# Patient Record
Sex: Female | Born: 1945 | Race: White | Hispanic: No | Marital: Married | Smoking: Never smoker
Health system: Southern US, Community
[De-identification: ages and names within clinical notes are randomized; demographics above are authoritative.]

## PROBLEM LIST (undated history)

## (undated) DIAGNOSIS — Z9889 Other specified postprocedural states: Secondary | ICD-10-CM

## (undated) DIAGNOSIS — G8929 Other chronic pain: Secondary | ICD-10-CM

## (undated) DIAGNOSIS — F32A Depression, unspecified: Secondary | ICD-10-CM

## (undated) DIAGNOSIS — K449 Diaphragmatic hernia without obstruction or gangrene: Secondary | ICD-10-CM

## (undated) DIAGNOSIS — Z8701 Personal history of pneumonia (recurrent): Secondary | ICD-10-CM

## (undated) DIAGNOSIS — G629 Polyneuropathy, unspecified: Secondary | ICD-10-CM

## (undated) DIAGNOSIS — E7800 Pure hypercholesterolemia, unspecified: Secondary | ICD-10-CM

## (undated) DIAGNOSIS — G4730 Sleep apnea, unspecified: Secondary | ICD-10-CM

## (undated) DIAGNOSIS — K589 Irritable bowel syndrome without diarrhea: Secondary | ICD-10-CM

## (undated) DIAGNOSIS — I10 Essential (primary) hypertension: Secondary | ICD-10-CM

## (undated) DIAGNOSIS — T4145XA Adverse effect of unspecified anesthetic, initial encounter: Secondary | ICD-10-CM

## (undated) DIAGNOSIS — M1990 Unspecified osteoarthritis, unspecified site: Secondary | ICD-10-CM

## (undated) DIAGNOSIS — N390 Urinary tract infection, site not specified: Secondary | ICD-10-CM

## (undated) DIAGNOSIS — E039 Hypothyroidism, unspecified: Secondary | ICD-10-CM

## (undated) DIAGNOSIS — Z8719 Personal history of other diseases of the digestive system: Secondary | ICD-10-CM

## (undated) DIAGNOSIS — Z9289 Personal history of other medical treatment: Secondary | ICD-10-CM

## (undated) DIAGNOSIS — G5600 Carpal tunnel syndrome, unspecified upper limb: Secondary | ICD-10-CM

## (undated) DIAGNOSIS — E038 Other specified hypothyroidism: Secondary | ICD-10-CM

## (undated) DIAGNOSIS — J449 Chronic obstructive pulmonary disease, unspecified: Secondary | ICD-10-CM

## (undated) DIAGNOSIS — D649 Anemia, unspecified: Secondary | ICD-10-CM

## (undated) DIAGNOSIS — N209 Urinary calculus, unspecified: Secondary | ICD-10-CM

## (undated) DIAGNOSIS — B372 Candidiasis of skin and nail: Secondary | ICD-10-CM

## (undated) DIAGNOSIS — K3184 Gastroparesis: Secondary | ICD-10-CM

## (undated) DIAGNOSIS — R51 Headache: Secondary | ICD-10-CM

## (undated) DIAGNOSIS — C4490 Unspecified malignant neoplasm of skin, unspecified: Secondary | ICD-10-CM

## (undated) DIAGNOSIS — I2510 Atherosclerotic heart disease of native coronary artery without angina pectoris: Secondary | ICD-10-CM

## (undated) DIAGNOSIS — R519 Headache, unspecified: Secondary | ICD-10-CM

## (undated) DIAGNOSIS — M549 Dorsalgia, unspecified: Secondary | ICD-10-CM

## (undated) DIAGNOSIS — K5790 Diverticulosis of intestine, part unspecified, without perforation or abscess without bleeding: Secondary | ICD-10-CM

## (undated) DIAGNOSIS — J189 Pneumonia, unspecified organism: Secondary | ICD-10-CM

## (undated) DIAGNOSIS — F419 Anxiety disorder, unspecified: Secondary | ICD-10-CM

## (undated) DIAGNOSIS — M810 Age-related osteoporosis without current pathological fracture: Secondary | ICD-10-CM

## (undated) DIAGNOSIS — T8859XA Other complications of anesthesia, initial encounter: Secondary | ICD-10-CM

## (undated) DIAGNOSIS — J302 Other seasonal allergic rhinitis: Secondary | ICD-10-CM

## (undated) DIAGNOSIS — F329 Major depressive disorder, single episode, unspecified: Secondary | ICD-10-CM

## (undated) DIAGNOSIS — K8020 Calculus of gallbladder without cholecystitis without obstruction: Secondary | ICD-10-CM

## (undated) DIAGNOSIS — K219 Gastro-esophageal reflux disease without esophagitis: Secondary | ICD-10-CM

## (undated) HISTORY — PX: FOOT SURGERY: SHX648

## (undated) HISTORY — DX: Hypothyroidism, unspecified: E03.9

## (undated) HISTORY — PX: APPENDECTOMY: SHX54

## (undated) HISTORY — DX: Candidiasis of skin and nail: B37.2

## (undated) HISTORY — DX: Gastro-esophageal reflux disease without esophagitis: K21.9

## (undated) HISTORY — DX: Personal history of pneumonia (recurrent): Z87.01

## (undated) HISTORY — DX: Gastroparesis: K31.84

## (undated) HISTORY — DX: Unspecified malignant neoplasm of skin, unspecified: C44.90

## (undated) HISTORY — DX: Other specified hypothyroidism: E03.8

## (undated) HISTORY — DX: Anemia, unspecified: D64.9

## (undated) HISTORY — DX: Personal history of other medical treatment: Z92.89

## (undated) HISTORY — PX: NOSE SURGERY: SHX723

## (undated) HISTORY — PX: ABDOMINAL HYSTERECTOMY: SHX81

## (undated) HISTORY — DX: Unspecified osteoarthritis, unspecified site: M19.90

## (undated) HISTORY — PX: UMBILICAL HERNIA REPAIR: SHX196

## (undated) HISTORY — DX: Polyneuropathy, unspecified: G62.9

## (undated) HISTORY — DX: Chronic obstructive pulmonary disease, unspecified: J44.9

## (undated) HISTORY — PX: TOE AMPUTATION: SHX809

## (undated) HISTORY — PX: ARTHOSCOPIC ROTAOR CUFF REPAIR: SHX5002

## (undated) HISTORY — DX: Essential (primary) hypertension: I10

## (undated) HISTORY — DX: Other specified postprocedural states: Z98.890

## (undated) HISTORY — PX: KNEE SURGERY: SHX244

## (undated) HISTORY — PX: WRIST SURGERY: SHX841

## (undated) HISTORY — DX: Diverticulosis of intestine, part unspecified, without perforation or abscess without bleeding: K57.90

## (undated) HISTORY — PX: BACK SURGERY: SHX140

## (undated) HISTORY — DX: Headache: R51

## (undated) HISTORY — DX: Other chronic pain: G89.29

## (undated) HISTORY — DX: Major depressive disorder, single episode, unspecified: F32.9

## (undated) HISTORY — DX: Other seasonal allergic rhinitis: J30.2

## (undated) HISTORY — DX: Sleep apnea, unspecified: G47.30

## (undated) HISTORY — PX: TONSILLECTOMY: SUR1361

## (undated) HISTORY — DX: Headache, unspecified: R51.9

## (undated) HISTORY — PX: BREAST SURGERY: SHX581

## (undated) HISTORY — DX: Diaphragmatic hernia without obstruction or gangrene: K44.9

## (undated) HISTORY — PX: CHOLECYSTECTOMY: SHX55

## (undated) HISTORY — DX: Personal history of other diseases of the digestive system: Z87.19

## (undated) HISTORY — DX: Carpal tunnel syndrome, unspecified upper limb: G56.00

## (undated) HISTORY — DX: Irritable bowel syndrome, unspecified: K58.9

## (undated) HISTORY — PX: OTHER SURGICAL HISTORY: SHX169

## (undated) HISTORY — DX: Age-related osteoporosis without current pathological fracture: M81.0

## (undated) HISTORY — DX: Calculus of gallbladder without cholecystitis without obstruction: K80.20

## (undated) HISTORY — DX: Depression, unspecified: F32.A

## (undated) HISTORY — DX: Anxiety disorder, unspecified: F41.9

---

## 2000-09-11 ENCOUNTER — Inpatient Hospital Stay (HOSPITAL_COMMUNITY): Admission: EM | Admit: 2000-09-11 | Discharge: 2000-09-12 | Payer: Self-pay | Admitting: Cardiology

## 2001-06-27 ENCOUNTER — Other Ambulatory Visit: Admission: RE | Admit: 2001-06-27 | Discharge: 2001-06-27 | Payer: Self-pay | Admitting: Dermatology

## 2002-02-04 ENCOUNTER — Encounter: Payer: Self-pay | Admitting: Family Medicine

## 2002-02-04 ENCOUNTER — Ambulatory Visit (HOSPITAL_COMMUNITY): Admission: RE | Admit: 2002-02-04 | Discharge: 2002-02-04 | Payer: Self-pay | Admitting: Family Medicine

## 2002-03-31 ENCOUNTER — Encounter (HOSPITAL_COMMUNITY): Admission: RE | Admit: 2002-03-31 | Discharge: 2002-04-30 | Payer: Self-pay | Admitting: Orthopedic Surgery

## 2002-05-08 ENCOUNTER — Encounter (HOSPITAL_COMMUNITY): Admission: RE | Admit: 2002-05-08 | Discharge: 2002-06-07 | Payer: Self-pay | Admitting: Orthopedic Surgery

## 2002-06-09 ENCOUNTER — Encounter (HOSPITAL_COMMUNITY): Admission: RE | Admit: 2002-06-09 | Discharge: 2002-07-09 | Payer: Self-pay | Admitting: Orthopedic Surgery

## 2002-07-22 ENCOUNTER — Encounter (HOSPITAL_COMMUNITY): Admission: RE | Admit: 2002-07-22 | Discharge: 2002-08-21 | Payer: Self-pay | Admitting: Orthopedic Surgery

## 2002-09-11 ENCOUNTER — Encounter: Payer: Self-pay | Admitting: Emergency Medicine

## 2002-09-11 ENCOUNTER — Inpatient Hospital Stay (HOSPITAL_COMMUNITY): Admission: EM | Admit: 2002-09-11 | Discharge: 2002-09-12 | Payer: Self-pay | Admitting: Emergency Medicine

## 2002-09-12 ENCOUNTER — Encounter: Payer: Self-pay | Admitting: Cardiology

## 2002-10-22 ENCOUNTER — Encounter: Payer: Self-pay | Admitting: Family Medicine

## 2002-10-22 ENCOUNTER — Ambulatory Visit (HOSPITAL_COMMUNITY): Admission: RE | Admit: 2002-10-22 | Discharge: 2002-10-22 | Payer: Self-pay | Admitting: Family Medicine

## 2002-12-16 ENCOUNTER — Ambulatory Visit (HOSPITAL_COMMUNITY): Admission: RE | Admit: 2002-12-16 | Discharge: 2002-12-16 | Payer: Self-pay | Admitting: Internal Medicine

## 2003-06-23 ENCOUNTER — Encounter: Payer: Self-pay | Admitting: Emergency Medicine

## 2003-06-23 ENCOUNTER — Emergency Department (HOSPITAL_COMMUNITY): Admission: EM | Admit: 2003-06-23 | Discharge: 2003-06-23 | Payer: Self-pay | Admitting: Emergency Medicine

## 2003-06-29 ENCOUNTER — Encounter (HOSPITAL_COMMUNITY): Admission: RE | Admit: 2003-06-29 | Discharge: 2003-07-29 | Payer: Self-pay | Admitting: *Deleted

## 2003-09-14 ENCOUNTER — Encounter: Payer: Self-pay | Admitting: Family Medicine

## 2003-09-14 ENCOUNTER — Ambulatory Visit (HOSPITAL_COMMUNITY): Admission: RE | Admit: 2003-09-14 | Discharge: 2003-09-14 | Payer: Self-pay | Admitting: Family Medicine

## 2004-02-14 ENCOUNTER — Emergency Department (HOSPITAL_COMMUNITY): Admission: EM | Admit: 2004-02-14 | Discharge: 2004-02-14 | Payer: Self-pay | Admitting: Emergency Medicine

## 2004-03-16 ENCOUNTER — Ambulatory Visit (HOSPITAL_COMMUNITY): Admission: RE | Admit: 2004-03-16 | Discharge: 2004-03-16 | Payer: Self-pay | Admitting: Family Medicine

## 2004-04-01 ENCOUNTER — Ambulatory Visit (HOSPITAL_COMMUNITY): Admission: RE | Admit: 2004-04-01 | Discharge: 2004-04-01 | Payer: Self-pay | Admitting: Family Medicine

## 2004-06-01 ENCOUNTER — Other Ambulatory Visit: Payer: Self-pay

## 2004-08-04 ENCOUNTER — Ambulatory Visit (HOSPITAL_COMMUNITY): Admission: RE | Admit: 2004-08-04 | Discharge: 2004-08-04 | Payer: Self-pay | Admitting: Family Medicine

## 2006-10-07 ENCOUNTER — Observation Stay (HOSPITAL_COMMUNITY): Admission: EM | Admit: 2006-10-07 | Discharge: 2006-10-08 | Payer: Self-pay | Admitting: Emergency Medicine

## 2006-10-07 ENCOUNTER — Ambulatory Visit: Payer: Self-pay | Admitting: Cardiology

## 2007-01-30 ENCOUNTER — Ambulatory Visit: Payer: Self-pay | Admitting: Physician Assistant

## 2007-08-24 ENCOUNTER — Emergency Department (HOSPITAL_COMMUNITY): Admission: EM | Admit: 2007-08-24 | Discharge: 2007-08-24 | Payer: Self-pay | Admitting: Emergency Medicine

## 2008-02-24 ENCOUNTER — Ambulatory Visit (HOSPITAL_COMMUNITY): Admission: RE | Admit: 2008-02-24 | Discharge: 2008-02-24 | Payer: Self-pay | Admitting: Endocrinology

## 2008-04-05 ENCOUNTER — Ambulatory Visit: Payer: Self-pay | Admitting: Cardiology

## 2008-04-05 ENCOUNTER — Encounter: Payer: Self-pay | Admitting: Emergency Medicine

## 2008-04-05 ENCOUNTER — Inpatient Hospital Stay (HOSPITAL_COMMUNITY): Admission: EM | Admit: 2008-04-05 | Discharge: 2008-04-06 | Payer: Self-pay | Admitting: Cardiology

## 2008-04-20 ENCOUNTER — Ambulatory Visit: Payer: Self-pay | Admitting: Cardiology

## 2008-06-15 ENCOUNTER — Ambulatory Visit: Payer: Self-pay | Admitting: Cardiology

## 2010-02-08 ENCOUNTER — Ambulatory Visit: Payer: Self-pay | Admitting: Family Medicine

## 2010-06-28 ENCOUNTER — Encounter (HOSPITAL_COMMUNITY): Admission: RE | Admit: 2010-06-28 | Discharge: 2010-07-28 | Payer: Self-pay | Admitting: Endocrinology

## 2010-06-28 ENCOUNTER — Ambulatory Visit (HOSPITAL_COMMUNITY): Payer: Self-pay | Admitting: Endocrinology

## 2010-07-26 ENCOUNTER — Ambulatory Visit (HOSPITAL_COMMUNITY): Payer: Self-pay | Admitting: Oncology

## 2010-07-27 ENCOUNTER — Encounter (HOSPITAL_COMMUNITY): Admission: RE | Admit: 2010-07-27 | Discharge: 2010-08-26 | Payer: Self-pay | Admitting: Oncology

## 2011-01-04 ENCOUNTER — Ambulatory Visit: Payer: Self-pay | Admitting: Unknown Physician Specialty

## 2011-01-26 ENCOUNTER — Ambulatory Visit: Payer: Self-pay | Admitting: Pain Medicine

## 2011-02-23 ENCOUNTER — Ambulatory Visit: Payer: Self-pay | Admitting: Pain Medicine

## 2011-02-24 ENCOUNTER — Ambulatory Visit (HOSPITAL_COMMUNITY): Payer: Medicare Other

## 2011-02-24 ENCOUNTER — Encounter (HOSPITAL_COMMUNITY): Payer: Medicare Other | Attending: Endocrinology

## 2011-02-24 DIAGNOSIS — Z23 Encounter for immunization: Secondary | ICD-10-CM | POA: Insufficient documentation

## 2011-03-09 ENCOUNTER — Ambulatory Visit: Payer: Self-pay | Admitting: Pain Medicine

## 2011-03-20 ENCOUNTER — Ambulatory Visit: Payer: Self-pay | Admitting: Pain Medicine

## 2011-04-25 NOTE — H&P (Signed)
NAMECHANIYAH, JAHR               ACCOUNT NO.:  [id number]   MEDICAL RECORD NO.:  [id number]          PATIENT TYPE:  INP   LOCATION:  3712                         FACILITY:  [facility]   PHYSICIAN:  Nicolasa Ducking, ANP DATE OF BIRTH:  May 15, 1946   DATE OF ADMISSION:  04/05/2008  DATE OF DISCHARGE:                              HISTORY & PHYSICAL   PRIMARY CARDIOLOGIST:  Gerrit Friends. Dietrich Pates, MD, [facility]   PRIMARY CARE Malley Hauter:  Alan Mulder, MD, in [place].   PATIENT PROFILE:  A 65 year old Caucasian female with prior history of  hypertension, hyperlipidemia, diabetes, obesity, and family history of  coronary artery disease who presents with unstable angina.   PROBLEM:  1. Unstable angina.  2. History of nonobstructive coronary artery disease.      a.     Cardiac catheterization, October 2001.  Nonobstructive       coronary artery disease with minor irregularities.  Normal left       ventricular function.      b.     Exercise Myoview, October 2007, achieved six mets with a       heart rate 172.  Ejection fraction 65%.  Imaging revealed breast       attenuation with no evidence of ischemia or infarct.  3. Type 2 diabetes mellitus.  4. Morbid obesity.  5. Family history of coronary artery disease.  6. Chronic back pain.  7. 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  8. Depression.  9. Status post hysterectomy in 1974.  10.Skin grafting to nose secondary to basal and squamous cell      carcinoma.  11.Hypertension.  12.Gastroesophageal reflux disease.  13.Status post cholecystectomy.  14.Status post lumbar laminectomy.  15.Status post right rotator cuff repair.  16.Asthma.   HISTORY OF PRESENT ILLNESS:  A 65 year old married married, Caucasian female  with history of hypertension, hyperlipidemia, diabetes, obesity,  nonobstructive CAD, and family history of CAD.  She had trivial CAD and  cath in October 2001, and had a negative Myoview with poor exercise  tolerance in  October 2007.  She has chronic dyspnea related to asthma,  but otherwise denies chest discomfort or change in degree of dyspnea  over the years.  Today at church at approximately 11:30 a.m., she was  sitting in the pew when she had sudden onset of 10/10 substernal chest  squeezing and tightness with mild associated shortness of breath and  radiation and numbness of the right neck.  Her and her husband left the  church and drove to [facility] within about 15 minutes of the  onset of symptoms and there she was treated with IV nitroglycerin and  aspirin with complete resolution of symptoms approximately 30 minutes  after onset.  ECG shows ST depression, T-wave inversion in lead V1 and  V2, and point-of-care markers were negative x1.  She was transferred to  [facility] for further evaluation and is currently pain free.   ALLERGIES:  DARVOCET, SULFA, MACRODANTIN, ADHESIVE TAPE, LASIX, AND  CYMBALTA.   HOME MEDICATIONS:  1. Flexeril 10 mg t.i.d.  2. Lasix 80 mg b.i.d.  3. Doxepin 50 mg nightly.  4. Metformin 5 mg b.i.d.  5. Topamax 25 mg nightly.  6. Tramadol acetaminophen p.r.n.  7. Gabapentin 1200 mg nightly.  8. Lovastatin 40 mg nightly.  9. Allopurinol 100 mg daily.   FAMILY HISTORY:  Mother died of an MI at age 20.  Father died of CVA at  age 77.  She has siblings with COPD and coronary disease.   SOCIAL HISTORY:  She lives in [place] with her husband.  She is  retired.  She has never smoked cigarettes.  She denies any alcohol or  drugs.  She is not routinely exercising.   REVIEW OF SYSTEMS:  Positive for dyspnea secondary to asthma.  Positive  chest pain and mild dyspnea today.  She has had hoarseness and  productive cough with brown sputum for the past week.  She denies any  fevers or chills.  She has chronic bilateral foot and ankle pain.  She  has a history of diabetes.  Otherwise, all systems reviewed and  negative.   PHYSICAL EXAMINATION:  VITAL SIGNS: Temperature  97.5, heart rate 74,  respirations 20, blood pressure 119/66, and pulse ox 99% on 2 L.  GENERAL: Pleasant obese Caucasian female in no acute distress.  NECK: Obese and difficult to assess JVP.  No bruits.  LUNGS: Respirations are regular and labored. Clear to auscultation.  CARDIAC: Regular S1and S2.  No S3 or S4 murmurs.  ABDOMEN: Obese, soft, nontender, and nondistended.  Bowel sounds  present.  EXTREMITIES: All 4 extremities warm, dry, and pink.  No clubbing,  cyanosis, or edema.  Dorsalis pedis and posterior tibial pulses are 2+  and equal bilaterally.  SKIN: Warm and dry without lesions or masses.  HEENT: Normal.  NEUROLOGIC:  Grossly intact.  Nonfocal.   Chest x-ray shows no acute findings.  EKG shows sinus rhythm at rate of  72 at left axis, ST depression, T-wave inversion in V1 and V2.   LABORATORY DATA:  Hemoglobin 12.6, hematocrit 37.2, WBC 5.3, and  platelets 262.  Sodium 139, potassium 4.1, chloride 104, CO2 32, BUN 10,  creatinine 0.83, glucose 101, CK-MB 3.1, troponin-I less than 0.5, and  calcium 8.9.   ASSESSMENT AND PLAN:  1. Unstable angina.  The patient was truly concerning for angina.      Symptoms improved with aspirin and nitroglycerin.  Plan to add      heparin, beta blocker, and cycle cardiac markers.  We will continue      her statin.  She has a history of minor irregularities on cath      approximately 7-1/2 years ago but with her multiple risk factors      including hypertension, hyperlipidemia, diabetes, obesity, and      family history, we will plan catheterization in the a.m.  2. Hypertension, stable.  Consider ACE inhibitor as an outpatient with      her history of diabetes.  3. Hyperlipidemia.  Check lipids and LFTs.  Continue statin.  4. Diabetes.  Hold metformin, add sliding-scale insulin.  5. Obesity, cardiac rehab, and nutrition consult would be beneficial.  6. Chronic pain/neuropathy.  Continue home meds.      Nicolasa Ducking,  ANP     CB/MEDQ  D:  04/05/2008  T:  04/06/2008  Job:  [id number]

## 2011-04-25 NOTE — Letter (Signed)
June 15, 2008    Alan Mulder, M.D.  [address]  [place], [place] [address]   RE:  Alexis Rios, Alexis Rios  MRN:  [id number]  /  DOB:  September 27, 1946   Dear Dr. Patrecia Pace:   Ms. Stuber returns to the office for continued assessment and treatment  of atypical chest discomfort with insignificant coronary artery disease.  Since her last visit, she has done generally well.  She has occasional  episodes of chest discomfort with onset at rest and relief with  sublingual nitroglycerin after approximately 20 minutes.  She took 1  nitroglycerin on one occasion and 2 nitroglycerin on a second occasion.  She believes that Prilosec is decreasing the frequency of episodes.   Otherwise, medications have changed only in the addition of voltaren 75  mg b.i.d..   PHYSICAL EXAMINATION:  GENERAL:  Pleasant overweight woman in no acute  distress.  VITALS:  The weight is 237, 4 pounds more than in May.  NECK:  No jugular venous distention; no carotid bruits.  HEENT:  Scarring over the nose from previous skin grafting.  Thorax:  Scarring over the right clavicle, which was a donor site for her skin  grafts.  LUNGS:  Clear.  CARDIAC:  Normal first heart sounds; modest systolic ejection murmur;  accentuated second heart sound.  ABDOMEN:  Soft and nontender; no organomegaly.  EXTREMITIES:  No edema.   IMPRESSION:  Ms. Hahne is doing well with current therapy.  I explained  to her that her symptoms were probably multifactorial and not of great  concern.  We will continue symptomatic treatment for her current level  of control, which is good, but not perfect.  I will plan to re-evaluate  this nice woman in 6 months and then on a p.r.n. basis thereafter.    Sincerely,       Gerrit Friends. Dietrich Pates, MD, [facility]  Electronically Signed     Gerrit Friends. Dietrich Pates, MD, [facility]  Electronically Signed   RMR/MedQ  DD: 06/15/2008  DT: 06/15/2008  Job #: [id number]

## 2011-04-25 NOTE — Letter (Signed)
Apr 20, 2008    Alan Mulder, M.D.  [address]  [place], [place] [address]   RE:  YENNY, KOSA  MRN:  [id number]  /  DOB:  12/14/1945   Dear Dr. Patrecia Pace:   Ms. Asmus returns to the office following a recent admission to [facility].  Cardiac catheterization revealed insignificant coronary  disease.  She has continued to have mid substernal chest tightness,  which is intermittent and of mild to moderate severity.  This frequently  is present in the morning when she awakens.  There is no relationship to  exertion.  She has used omeprazole in the past for presumed GERD.   PHYSICAL EXAMINATION:  GENERAL APPEARANCE:  A pleasant overweight woman  in no acute distress.  HEENT:  Nasal deformity.  NECK:  No jugular venous distention; no carotid bruits.  LUNGS:  Clear.  CARDIAC:  Fourth heart sound present.  EXTREMITIES:  Benign catheterization site without bruits.   EKG:  Normal sinus rhythm; left anterior fascicular block versus prior  inferior myocardial infarction; delayed R-wave progression; nonspecific  T-wave abnormality.   IMPRESSION:  Ms. Shipp continues to have atypical chest discomfort.  She has benefited from sublingual nitroglycerin in the past.  A  prescription will be given to her.  She is also advised to take  omeprazole over-the-counter 20 mg daily.  I will reassess this nice  woman in two months.    Sincerely,      Gerrit Friends. Dietrich Pates, MD, [facility]  Electronically Signed    RMR/MedQ  DD: 04/20/2008  DT: 04/20/2008  Job #: [id number]

## 2011-04-25 NOTE — Cardiovascular Report (Signed)
Alexis Rios, Alexis Rios               ACCOUNT NO.:  [id number]   MEDICAL RECORD NO.:  [id number]          PATIENT TYPE:  INP   LOCATION:  3712                         FACILITY:  [facility]   PHYSICIAN:  Bevelyn Buckles. Bensimhon, MDDATE OF BIRTH:  07-14-1946   DATE OF PROCEDURE:  04/06/2008  DATE OF DISCHARGE:  04/06/2008                            CARDIAC CATHETERIZATION   PRIMARY CARE PHYSICIAN:  Alan Mulder, M.D., in [place].   CARDIOLOGIST:  Pricilla Riffle, MD, [facility].   PATIENT IDENTIFICATION:  Alexis Rios is a 65 year old with multiple  cardiac risk factors including obesity, diabetes, hypertension, and  hyperlipidemia.  She had a cardiac catheterization in 2001 which showed  minimal nonobstructive coronary artery disease.  She was admitted with  increasing chest pain concerning for unstable angina.  Chest pain was  relieved with nitroglycerin.  She was thus referred for cardiac  catheterization.  Cardiac markers have been normal.   PROCEDURES PERFORMED:  1. Selective coronary angiography.  2. Left heart catheterization.  3. Left ventriculogram.   DESCRIPTION OF PROCEDURE:  The risks and indication of the procedure  were reviewed.  Consent was signed and placed on the chart.  A 5-French  arterial sheath was placed in right femoral artery using modified  Seldinger technique.  Standard catheters including JL-4, JR-4, and  angled pigtail used for procedure.  All catheter exchanges made of wire.  There was no apparent complications.   Central aortic pressure 131/72 with a mean of 96.  LV pressure was 108/0  with an EDP of 4.  There was no aortic stenosis.   Left main was long, angiographically normal.   LAD was a moderate-sized vessel which tapered distally gave off 2  diagonals, was angiographically normal.   Left circumflex gave off larger OM-1 and OM-2 and had 30% stenosis in  the mid AV groove circ, otherwise normal.   Right coronary artery was large dominant vessel gave  off an RV branch,  PDA, and posterolateral.  There was 20% plaque in the mid RCA.   Left ventriculogram showed an ejection fraction of 60%.  There was no  regional wall motion abnormalities.  No trace mitral regurgitation.   ASSESSMENT:  1. Minimal nonobstructive coronary artery disease.  2. Normal left ventricular function.   PLAN:  Given her angiogram, I doubt her chest pain is cardiac in nature.  We will need to continue risk factor management.  Probable discharge to  home later tonight if her groin remains stable.      Bevelyn Buckles. Bensimhon, MD  Electronically Signed     DRB/MEDQ  D:  04/06/2008  T:  04/07/2008  Job:  [id number]

## 2011-04-25 NOTE — Discharge Summary (Signed)
Alexis Rios, Alexis Rios               ACCOUNT NO.:  [id number]   MEDICAL RECORD NO.:  [id number]          PATIENT TYPE:  INP   LOCATION:  3712                         FACILITY:  [facility]   PHYSICIAN:  Bevelyn Buckles. Bensimhon, MDDATE OF BIRTH:  [birth year]   DATE OF ADMISSION:  04/05/2008  DATE OF DISCHARGE:  04/06/2008                               DISCHARGE SUMMARY   PRIMARY CARDIOLOGIST:  Dr. Gerrit Friends. Rothbart.   PRIMARY CARE [place] Santino:  Dr. Alan Mulder in [place].   PROCEDURE PERFORMED DURING HOSPITALIZATION:  Cardiac catheterization  completed by Dr. Bevelyn Buckles. Bensimhon on April 06, 2008, revealing  minimal nonobstructive coronary artery disease with normal left  ventricular function.   Plan:  Medical therapy.   FINAL DISCHARGE DIAGNOSES:  1. Unstable angina.  2. History of nonobstructive coronary artery disease.  A:  Status post cardiac catheterization on April 06, 2008, revealing  nonobstructive coronary artery disease and normal left ventricular  function.  B:  Exercise Myoview in October 2007, with an EF of 65% imaging revealed  breast attenuation with no evidence of ischemia or infarct.  1. Type 2 diabetes.  2. Morbid obesity.  3. Depression.  4. Hypertension.   HOSPITAL COURSE:  A 65 year old Caucasian female with history of  hypertension, hyperlipidemia, diabetes, obesity, and nonobstructive CAD,  who came to [facility] from [facility] secondary  to complaints of shortness of breath and chest discomfort radiating to  the right neck. The patient said it was a sudden onset while she was  sitting in church.  As a result of this, the patient was brought to  [facility] after being evaluated at [facility] and  transferred as a result.   The patient's cardiac enzymes were cycled and monitored over the  weekend, and the patient was scheduled for cardiac catheterization on  April 06, 2008.  The patient's cardiac enzymes were  found to be negative  x3.  She was placed on heparin until catheterization was planned.  She  was without any further complaints of chest discomfort.  The patient's  other labs remained stable.  Her Glucophage was held prior to  catheterization.   On day of catheterization, the patient did well without any evidence of  hematoma, bleeding, or signs of infection.  The patient was found to  have a minimal nonobstructive CAD and was advised to be transferred home  after recovery period, and continue current medication regimen.  The  patient was found to have elevated total cholesterol of 214 with  triglycerides of 117, HDL 45, and LDL of 146.  The patient is on Zocor  20 mg at bedtime, which should be increased to 40 mg at bedtime.  The  patient was given postcardiac catheterization instructions with followup  with Dr. Dietrich Pates stated as above.   DISCHARGE LABS:  Hemoglobin 12.8, hematocrit 38.9, white blood cell 6.6,  platelets 285.  PT 12.3 and PTT 54.  Sodium 138, potassium 3.7, chloride  101, CO2 of 30, BUN 8, creatinine 0.76, glucose of 122.  The patient's  troponins were found to  be negative x3.   DISCHARGE MEDICATIONS:  1. Flexeril 10 mg 3 times daily.  2. Lasix 80 mg twice a day.  3. Doxepin 50 mg at bedtime.  4. Metformin 500 mg twice a day to start in 48 hours.  5. Topamax 25 mg at bedtime.  6. Gabapentin 1200 mg at bedtime.  7. Lovastatin 20 mg at bedtime.  8. Allopurinol 100 mg daily.  9. Symbicort inhaler twice a day.  10.Albuterol inhaler 4 times a day.  11.Topamax at bedtime.   ALLERGIES:  LATEX, SULFA, NITROFURANTOIN, DARVOCET, and KEFLEX.   FOLLOWUP PLANS AND APPOINTMENTS:  1. The patient is to follow up with Dr. Dietrich Pates on Monday Apr 20, 2008, at 2:30 p.m. for continued cardiac care.  2. The patient is to follow up with primary care physician for      continued management of diabetes and other medical issues.  3. The patient has been given postcardiac  catheterization instructions      with particular emphasis on the right groin site for evidence of      bleeding, hematoma, or signs of infections.   TIME SPENT WITH THE PATIENT TO INCLUDE PHYSICIAN TIME:  30 minutes.      Bettey Mare. Lyman Bishop, NP      Bevelyn Buckles. Bensimhon, MD  Electronically Signed    KML/MEDQ  D:  04/06/2008  T:  04/07/2008  Job:  [id number]

## 2011-04-28 NOTE — Op Note (Signed)
NAME:  Alexis Rios, Alexis Rios                         ACCOUNT NO.:  [id number]   MEDICAL RECORD NO.:  [id number]                   PATIENT TYPE:  AMB   LOCATION:  DAY                                  FACILITY:  [facility]   PHYSICIAN:  Lionel December, M.D.                 DATE OF BIRTH:  05-12-1946   DATE OF PROCEDURE:  12/16/2002  DATE OF DISCHARGE:                                 OPERATIVE REPORT   PROCEDURE:  Esophagogastroduodenoscopy, followed by total colonoscopy.   INDICATIONS:  The patient is a 65 year old Caucasian female with recurrent  chest pain felt to be noncardiac.  She also has typical symptoms of  heartburn different than her chest pain, and she has not responded to PPIs.  She previously has been on Prevacid and now on Aciphex.  She also gives  history of intermittent hematochezia possibly due to hemorrhoids.  She is  undergoing colonoscopy to be sure that she does not have polyps or other  abnormalities.  Today she is also complaining of pain in her right upper  quadrant of her abdomen.  Exam is unremarkable other than mild tenderness.  Prior CT did not show any significant findings other than fatty liver.   Both the procedures were reviewed with the patient and informed consent was  obtained.   PREMEDICATION:  Cetacaine spray for pharyngeal topical anesthesia, Demerol  50 mg IV, Versed 8 mg IV in divided dose.   INSTRUMENT USED:  Olympus video system.   FINDINGS:  Procedure performed in endoscopy suite.  The patient's vital  signs and O2 saturation were monitored during the procedure and remained  stable.   PROCEDURE:  Esophagogastroduodenoscopy.   DESCRIPTION OF PROCEDURE:  The patient was placed in the left lateral  recumbent position and the endoscope was passed via the oropharynx without  any difficulty into esophagus.   Esophagus:  Mucosa of the esophagus normal throughout.  The squamocolumnar  junction was unremarkable, and there was small sliding hiatal hernia.   No  endoscopic changes of reflux esophagitis noted.   Stomach:  It was empty and distended very well with insufflation.  Folds of  proximal stomach were normal.  Examination of the mucosa revealed focal  prepyloric erythema, but there were no erosions or ulcers noted.  Please  note the patient has been treated for H. pylori infection last year.   Duodenum:  Duodenum exam showed the bulb and second part of duodenum were  normal.   The endoscope was withdrawn and the patient was prepared for procedure #2.   PROCEDURE:  Colonoscopy.   DESCRIPTION OF PROCEDURE:  The rectal examination performed.  She had small  sentinel skin tag.  Digital exam was normal.  The scope was placed in the  rectum and advanced under vision into sigmoid colon and beyond.  Preparation  was satisfactory.  The scope was passed to the cecum, which was identified  by  appendiceal stump and ileocecal valve.  Short segment of TI was also  examined and was normal.  There was a small, friable polyp at the cecum,  which was ablated by a cold biopsy.  As the scope was withdrawn, colonic  mucosa was carefully examined and there were no other abnormalities.  The  rectal mucosa was normal.  The scope was retroflexed to examine the  anorectal junction, and small hemorrhoids were noted below the dentate line.  The endoscope was straightened and withdrawn.  The patient tolerated the  procedure well.   FINAL DIAGNOSES:  1. Small sliding hiatal hernia.  No endoscopic evidence of reflux     esophagitis.  No evidence of peptic ulcer disease.  2. Normal terminal ileum.  3. Small polyp ablated by cold biopsy from the cecum.  4. Small external hemorrhoids.   RECOMMENDATIONS:  1. Antireflux measures reinforced.  Will increase her Aciphex to 20 mg p.o.     b.i.d.  2. Suspect her abdominal pain may be due to IBS, and will try her on Librax     at a low dose of one b.i.d.  I will be contacting the patient with biopsy     results  and plan to see her in the office in four to six weeks from now.                                               Lionel December, M.D.    NR/MEDQ  D:  12/16/2002  T:  12/16/2002  Job:  [id number]   cc:   Mila Homer. Sudie Bailey, M.D.  [address] [place], [place] [address]  Fax: [phone]

## 2011-04-28 NOTE — Group Therapy Note (Signed)
   [name], [name]                           ACCOUNT NO.:  [id number]   MEDICAL RECORD NO.:  [id number]                   PATIENT TYPE:  PREC   LOCATION:                                       FACILITY:   PHYSICIAN:  Vida Roller, M.D.                DATE OF BIRTH:   DATE OF PROCEDURE:  06/29/2003  DATE OF DISCHARGE:                                    STRESS TEST   EXERCISE CARDIOLITE   INDICATION:  Alexis Rios is a 65 year old female with nonobstructive coronary  artery disease and normal LV function by cardiac catheterization in October  2001.  Her cardiac risk factors include family history and hyperlipidemia.  She had an exercise Cardiolite in October 2003 which revealed no ischemia.  She now represents with atypical chest discomfort.   Baseline data:  EKG shows sinus rhythm at 75 beats per minute with  nonspecific ST abnormalities.  Blood pressure was 118/90.   The patient exercised for a total of four minutes 30 seconds to 6.1 METs.  Maximum heart rate achieved was 160 beats per minute which is 99% of  predicted maximum.  Maximum blood pressure is 142/84 which recovered down to  128/78 in recovery.  EKG shows no ischemic changes and few PVCs.   Final images and results are pending M.D. review.     Amy Mercy Riding, P.A. [facility]                     Vida Roller, M.D.    AB/MEDQ  D:  06/29/2003  T:  06/30/2003  Job:  [id number]

## 2011-04-28 NOTE — Consult Note (Signed)
NAMESOLACE, MANWARREN               ACCOUNT NO.:  [id number]   MEDICAL RECORD NO.:  [id number]          PATIENT TYPE:  INP   LOCATION:  A216                          FACILITY:  [facility]   PHYSICIAN:  Gerrit Friends. Dietrich Pates, MD, FACCDATE OF BIRTH:  [birth year]   DATE OF CONSULTATION:  10/08/2006  DATE OF DISCHARGE:                                   CONSULTATION   REFERRING PHYSICIAN:  Dr. Rito Ehrlich.   HISTORY OF PRESENT ILLNESS:  A 65 year old woman with multiple prior  admissions for acute chest discomfort returns with a recurrent, returns with  a recurrence of symptoms.  Ms. Hollingshead cardiac history dates to 2001 when  she was admitted to [facility] and transferred to [facility] with chest  discomfort.  Cardiac catheterization revealed normal coronary arteries and  normal left ventricular systolic function.  She subsequently returned in  2003 and underwent a stress nuclear study that was unremarkable except for  impaired exercise capacity.  She did well until the day of admission when  she developed chest tightness while traveling to church.  This was  moderately severe substernal tightness radiating to the neck and back.  There were no associated symptoms.  After an hour or so, the patient was  administered nitroglycerin by a friend, providing some relief.  She  subsequently requested additional assistance and was transported to the  emergency department by her husband where the pain gradually subsided.  Initial cardiac markers were negative.  EKG was nonspecifically abnormal as  has been the case in the past.   The patient has multiple cardiovascular risk factors including hypertension,  adult onset diabetes, obesity and a positive family history.  She also has a  history of GERD.   PAST MEDICAL HISTORY:  Is otherwise notable for:  1. Chronic back pain.  2. Depression.  3. 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  4. She underwent hysterectomy in 1974.  5. She has had a  number of skin graft procedures to her nose.   ALLERGIES:  The patient reports allergies to DARVOCET, SULFA, MACRODANTIN  and ADHESIVE TAPE as well as LATEX.   MEDICATIONS PRIOR TO ADMISSION:  1. Flexeril 10 mg 3 times a day.  2. Furosemide 80 mg 3 times a day.  3. Doxepin 50 mg daily.  4. Metformin 500 mg b.i.d.  5. Topamax 50 mg nightly.  6. Nexium 40 mg b.i.d.  7. Tramadol/acetaminophen p.r.n.  8. Gabapentin 1200 mg nightly.   SOCIAL HISTORY:  No use of tobacco products; no excessive use of alcohol.  Married and lives locally; two adult children.  Receiving social  security/disability.   FAMILY HISTORY:  Mother suffered a fatal myocardial infarction at age 59;  father had a fatal CVA at age 43.  He has siblings with COPD and cardiac  problems.   REVIEW OF SYSTEMS:  Is notable for intermittent bronchitis, fairly frequent  headaches, occasional dizziness, good diabetic control with CBGs between 110  and 150, hemorrhoids, the need for corrective lenses, and chronic shoulder  pain.  She has problems sleeping and snores.   PHYSICAL  EXAMINATION:  GENERAL:  Pleasant pleasant overweight woman in no  acute distress.  VITAL SIGNS: The temperature is 98, blood pressure 110/70, heart rate 70 and  regular, respirations 20, O2 saturation 95% on room air.  CBG 149. Weight  220 pounds.  HEENT:  Anicteric sclerae; pupils equal, round, react to light; EOMs full.  Normal oral mucosa.  NECK:  No jugular venous distension; normal carotid upstrokes without  bruits.  ENDOCRINE:  No thyromegaly.  HEMATOPOIETIC:  No adenopathy.  SKIN:  Scarring over the nose; surgical scar for donation site over the  right supraclavicular region.  LUNGS:  Clear.  CARDIAC:  Normal S1; increased S2; fourth heart sound present; modest early  systolic ejection murmur; normal PMI.  ABDOMEN:  Soft and nontender; aortic pulsation not palpable; no bruits; no  masses; no organomegaly.  EXTREMITIES:  No edema; normal  distal pulses.  NEUROMUSCULAR:  Symmetric strength and tone; normal cranial nerves.  PSYCHIATRIC:  Alert and oriented; normal affect.   EKG:  Normal sinus rhythm; left anterior fascicular block; nonspecific  anterior ST-T-wave abnormalities; delayed R-wave progression; when compared  to a prior tracing of June 23, 2003, there has been no significant interval  change.   Laboratory studies are benign including normal chemistries, a normal CBC,  normal cardiac markers and a normal TSH level.  Lipid profile is pending.   IMPRESSION:  Ms Finnicum is stable at the present time.  She has ruled out for  myocardial infarction.  She has recurrent symptoms similar to those she has  experienced in the past, which were not caused by ischemic heart disease.  Although risk factors are substantial, her past history and unchanged  testing are reassuring.  We will proceed with a stress nuclear study today.  If negative, she can be discharged with further assessment on an outpatient  basis.   The request for consultation is greatly appreciated.  We will be happy to  resume cardiology care of this very nice woman.  She would benefit from  chronic treatment with statins as well as aspirin.     Gerrit Friends. Dietrich Pates, MD, [facility]  Electronically Signed    RMR/MEDQ  D:  10/08/2006  T:  10/08/2006  Job:  [id number]

## 2011-04-28 NOTE — Procedures (Signed)
NAMEAMALIE, KORAN               ACCOUNT NO.:  [id number]   MEDICAL RECORD NO.:  [id number]          PATIENT TYPE:  INP   LOCATION:  A216                          FACILITY:  [facility]   PHYSICIAN:  Edward L. Juanetta Gosling, M.D.DATE OF BIRTH:  1946/02/16   DATE OF PROCEDURE:  DATE OF DISCHARGE:                                EKG INTERPRETATION   ELECTROCARDIOGRAM   The rhythm is normal sinus rhythm with a rate in the 70s.  There is left  anterior hemiblock.  Q waves are seen inferiorly which could indicate a  previous inferior myocardial infarction and clinical correlation is  suggested.  There was slow R wave progression across the precordium and  actual decrease in size of the R wave from V2 to V3 which may include an  anterior infarction as well.   IMPRESSION:  Abnormal electrocardiogram.      Edward L. Juanetta Gosling, M.D.  Electronically Signed     ELH/MEDQ  D:  10/08/2006  T:  10/08/2006  Job:  [id number]

## 2011-06-01 ENCOUNTER — Emergency Department: Payer: Self-pay | Admitting: Emergency Medicine

## 2011-06-28 ENCOUNTER — Ambulatory Visit: Payer: Self-pay

## 2011-07-05 ENCOUNTER — Other Ambulatory Visit: Payer: Self-pay

## 2011-07-05 ENCOUNTER — Emergency Department (HOSPITAL_COMMUNITY)
Admission: EM | Admit: 2011-07-05 | Discharge: 2011-07-05 | Disposition: A | Discharge status: Home / Self Care | Payer: Medicare Other | Attending: Emergency Medicine | Admitting: Emergency Medicine

## 2011-07-05 ENCOUNTER — Encounter: Payer: Self-pay | Admitting: Emergency Medicine

## 2011-07-05 DIAGNOSIS — I10 Essential (primary) hypertension: Secondary | ICD-10-CM | POA: Insufficient documentation

## 2011-07-05 DIAGNOSIS — Z859 Personal history of malignant neoplasm, unspecified: Secondary | ICD-10-CM | POA: Insufficient documentation

## 2011-07-05 DIAGNOSIS — M549 Dorsalgia, unspecified: Secondary | ICD-10-CM | POA: Insufficient documentation

## 2011-07-05 DIAGNOSIS — R5383 Other fatigue: Secondary | ICD-10-CM | POA: Insufficient documentation

## 2011-07-05 DIAGNOSIS — E119 Type 2 diabetes mellitus without complications: Secondary | ICD-10-CM | POA: Insufficient documentation

## 2011-07-05 DIAGNOSIS — R531 Weakness: Secondary | ICD-10-CM

## 2011-07-05 DIAGNOSIS — R5381 Other malaise: Secondary | ICD-10-CM | POA: Insufficient documentation

## 2011-07-05 HISTORY — DX: Other chronic pain: G89.29

## 2011-07-05 HISTORY — DX: Dorsalgia, unspecified: M54.9

## 2011-07-05 HISTORY — DX: Pure hypercholesterolemia, unspecified: E78.00

## 2011-07-05 LAB — BASIC METABOLIC PANEL
BUN: 10 mg/dL (ref 6–23)
CO2: 25 mEq/L (ref 19–32)
Calcium: 8.8 mg/dL (ref 8.4–10.5)
Chloride: 103 mEq/L (ref 96–112)
Creatinine, Ser: 0.67 mg/dL (ref 0.50–1.10)
GFR calc Af Amer: >60 mL/min (ref >60)
GFR calc non Af Amer: >60 mL/min (ref >60)
Glucose, Bld: 88 mg/dL (ref 70–99)
Potassium: 4 mEq/L (ref 3.5–5.1)
Sodium: 139 mEq/L (ref 135–145)

## 2011-07-05 LAB — URINALYSIS, ROUTINE W REFLEX MICROSCOPIC
Glucose, UA: NEGATIVE mg/dL
Leukocytes, UA: NEGATIVE
pH: 7 (ref 5.0–8.0)

## 2011-07-05 LAB — CBC
Hemoglobin: 11.8 g/dL — ABNORMAL LOW (ref 12.0–15.0)
MCV: 89.7 fL (ref 78.0–100.0)
Platelets: 228 10*3/uL (ref 150–400)
RBC: 4.17 MIL/uL (ref 3.87–5.11)
WBC: 5.1 10*3/uL (ref 4.0–10.5)

## 2011-07-05 MED ORDER — SODIUM CHLORIDE 0.9 % IV BOLUS (SEPSIS)
1000.0000 mL | Freq: Once | INTRAVENOUS | Status: AC
  Administered 2011-07-05: 1000 mL via INTRAVENOUS

## 2011-07-05 NOTE — ED Provider Notes (Signed)
History     Chief Complaint  Patient presents with  . Hypertension   HPI Comments: Pt c/o persistent high heart rate and BP over the past 3 days. States she has not been feeling well, feels "tired" and generally weak with lightheadedness, so checked HR (100s) and BP (occasionally 160-170s systolic) HR was 161 this AM pta, reports usually around 75. +diarrhea since yesterday, no bloody or black stools. No n/v, abd pain, cp, sob, numbness, tingling, or focal weakness. H/o DM but has not checked blood sugar lately.  Patient is a 65 y.o. female presenting with hypertension. The history is provided by the patient.  Hypertension This is a new problem. The current episode started more than 2 days ago (3 days ago). Progression since onset: wax/wanes. Pertinent negatives include no chest pain, no abdominal pain, no headaches and no shortness of breath. Associated symptoms comments: Fatigue/general weakness. The symptoms are aggravated by nothing. The symptoms are relieved by nothing.    Past Medical History  Diagnosis Date  . Diabetes mellitus   . Asthma   . Chronic back pain   . Hypercholesteremia   . Cancer     Past Surgical History  Procedure Date  . Cholecystectomy   . Tonsillectomy   . Shoulder surg   . Back surgery   . Knee surgery   . Foot surgery   . Wrist surgery   . Appendectomy   . Abdominal hysterectomy     History reviewed. No pertinent family history.  History  Substance Use Topics  . Smoking status: Never Smoker   . Smokeless tobacco: Not on file  . Alcohol Use: No    OB History    Grav Para Term Preterm Abortions TAB SAB Ect Mult Living                  Review of Systems  Respiratory: Negative for shortness of breath.   Cardiovascular: Negative for chest pain.  Gastrointestinal: Negative for abdominal pain.  Neurological: Negative for headaches.  All other systems reviewed and are negative.    Physical Exam  BP 150/72  Pulse 102  Temp(Src) 97.5 F  (36.4 C) (Oral)  Resp 19  Ht 5' (1.524 m)  Wt 238 lb (107.956 kg)  BMI 46.48 kg/m2  SpO2 94%  Physical Exam  Nursing note and vitals reviewed. Constitutional: She is oriented to person, place, and time. She appears well-developed and well-nourished. No distress.  HENT:  Head: Normocephalic.  Mouth/Throat: Mucous membranes are normal.  Eyes:       Normal appearance  Neck: Normal range of motion. Neck supple.  Cardiovascular: Normal rate and regular rhythm.  Exam reveals no gallop and no friction rub.   No murmur heard. Pulmonary/Chest: Effort normal and breath sounds normal. She has no wheezes. She has no rhonchi. She has no rales.  Abdominal: Soft. There is no tenderness.  Musculoskeletal: Normal range of motion.       Normal appearance  Neurological: She is alert and oriented to person, place, and time.       Motor intact in all extremities  Skin: Skin is warm and dry. No rash noted.       Color normal  Psychiatric: She has a normal mood and affect.    ED Course  Procedures  MDM  i personally reviewed the lab data  Results for orders placed during the hospital encounter of 07/05/11  CBC      Component Value Range   WBC 5.1  4.0 - 10.5 (K/uL)   RBC 4.17  3.87 - 5.11 (MIL/uL)   Hemoglobin 11.8 (*) 12.0 - 15.0 (g/dL)   HCT 16.1  09.6 - 04.5 (%)   MCV 89.7  78.0 - 100.0 (fL)   MCH 28.3  26.0 - 34.0 (pg)   MCHC 31.6  30.0 - 36.0 (g/dL)   RDW 40.9  81.1 - 91.4 (%)   Platelets 228  150 - 400 (K/uL)  BASIC METABOLIC PANEL      Component Value Range   Sodium 139  135 - 145 (mEq/L)   Potassium 4.0  3.5 - 5.1 (mEq/L)   Chloride 103  96 - 112 (mEq/L)   CO2 25  19 - 32 (mEq/L)   Glucose, Bld 88  70 - 99 (mg/dL)   BUN 10  6 - 23 (mg/dL)   Creatinine, Ser 7.82  0.50 - 1.10 (mg/dL)   Calcium 8.8  8.4 - 95.6 (mg/dL)   GFR calc non Af Amer >60  >60 (mL/min)   GFR calc Af Amer >60  >60 (mL/min)  URINALYSIS, ROUTINE W REFLEX MICROSCOPIC      Component Value Range   Color,  Urine YELLOW  YELLOW    Appearance CLEAR  CLEAR    Specific Gravity, Urine <1.005 (*) 1.005 - 1.030    pH 7.0  5.0 - 8.0    Glucose, UA NEGATIVE  NEGATIVE (mg/dL)   Hgb urine dipstick NEGATIVE  NEGATIVE    Bilirubin Urine NEGATIVE  NEGATIVE    Ketones, ur NEGATIVE  NEGATIVE (mg/dL)   Protein, ur NEGATIVE  NEGATIVE (mg/dL)   Urobilinogen, UA 0.2  0.0 - 1.0 (mg/dL)   Nitrite NEGATIVE  NEGATIVE    Leukocytes, UA NEGATIVE  NEGATIVE    I personally performed the services described in this documentation, which was scribed in my presence. The recorded information has been reviewed and considered. Lyanne Co, MD  The patient reports she feels better at this time.  Close followup with her primary care doctor is recommended.  The patient understands and is agreeable to outpatient plan.  Laboratory studies his vital signs are normal.  Abdominal exam is benign        Lyanne Co, MD 07/05/11 [phone]

## 2011-07-05 NOTE — ED Notes (Signed)
Pt a/ox4. Resp even and unlabored. NAD at this time. D/C instructions reviewed with pt. Pt verbalized understanding. Pt escorted to d/c desk. Pt ambulated with steady gate. 

## 2011-07-05 NOTE — ED Notes (Signed)
Pt c/o heartrate and bp up today. Having the urge to urinate but cant today also. States was having "a few chest pains on the way up here" stated was a dullness and only lasted a few minutes per pt. Nondiaphoretic. Has been taking bp and hr at home. States has been feeling tired x 2 days. C/o ha. Nad.

## 2011-07-13 ENCOUNTER — Other Ambulatory Visit (HOSPITAL_COMMUNITY): Payer: Self-pay | Admitting: Endocrinology

## 2011-07-13 DIAGNOSIS — N644 Mastodynia: Secondary | ICD-10-CM

## 2011-07-13 DIAGNOSIS — R52 Pain, unspecified: Secondary | ICD-10-CM

## 2011-07-17 ENCOUNTER — Ambulatory Visit (HOSPITAL_COMMUNITY): Payer: Medicare Other

## 2011-07-18 ENCOUNTER — Ambulatory Visit: Payer: Self-pay | Admitting: Pain Medicine

## 2011-07-26 ENCOUNTER — Ambulatory Visit (HOSPITAL_COMMUNITY)
Admission: RE | Admit: 2011-07-26 | Discharge: 2011-07-26 | Disposition: A | Discharge status: Home / Self Care | Payer: Medicare Other | Source: Ambulatory Visit | Attending: Endocrinology | Admitting: Endocrinology

## 2011-07-26 DIAGNOSIS — R52 Pain, unspecified: Secondary | ICD-10-CM

## 2011-08-04 ENCOUNTER — Other Ambulatory Visit: Payer: Self-pay | Admitting: Allergy and Immunology

## 2011-08-04 ENCOUNTER — Ambulatory Visit
Admission: RE | Admit: 2011-08-04 | Discharge: 2011-08-04 | Disposition: A | Discharge status: Home / Self Care | Payer: Medicare Other | Source: Ambulatory Visit | Attending: Allergy and Immunology | Admitting: Allergy and Immunology

## 2011-08-04 DIAGNOSIS — R05 Cough: Secondary | ICD-10-CM

## 2011-08-09 ENCOUNTER — Ambulatory Visit: Payer: Self-pay | Admitting: Pain Medicine

## 2011-08-15 ENCOUNTER — Ambulatory Visit: Payer: Self-pay | Admitting: Pain Medicine

## 2011-08-24 ENCOUNTER — Encounter: Payer: Self-pay | Admitting: Internal Medicine

## 2011-08-24 ENCOUNTER — Ambulatory Visit (INDEPENDENT_AMBULATORY_CARE_PROVIDER_SITE_OTHER): Payer: Medicare Other | Admitting: Internal Medicine

## 2011-08-24 DIAGNOSIS — R079 Chest pain, unspecified: Secondary | ICD-10-CM | POA: Insufficient documentation

## 2011-08-24 DIAGNOSIS — E785 Hyperlipidemia, unspecified: Secondary | ICD-10-CM

## 2011-08-24 NOTE — Assessment & Plan Note (Signed)
I am not convinced pain is clearly angina. Has risks. I would set up for echo as well as tress myoview.

## 2011-08-24 NOTE — Progress Notes (Signed)
HPI Patient is a 65 year old with a history of asthma, diabetes  She was referred by Dr. Lucie Leather for evaluation of CP Seen by R. Rothbart in the past in [place]. Has had chest pains.  Has gone to the ER for this.  Told it was reflux.  Has woken up with it.  Had throughout day.  Not associated with any activity. No bitter taste in mouth.  Last stress test about 11 years ago. Dr .Philbert Riser tried to do.  Couldn't do.    Aborted. Dizzy, presyncope but no syncope.  Hot day.  Felt like she had been getting in enough fluids.  No palpitations Sugars run in 120s.  Has known about DM x 5 yeas. Wheezes. Doesn't know cholesterol.  Pneumonia in spring.  Allergies  Allergen Reactions  . Codeine Itching  . Cymbalta (Duloxetine Hcl) Other (See Comments)    Can not urinate  . Keflet (Cephalexin Monohydrate) Itching  . Sulfa Antibiotics Swelling    Current Outpatient Prescriptions  Medication Sig Dispense Refill  . albuterol (PROVENTIL HFA;VENTOLIN HFA) 108 (90 BASE) MCG/ACT inhaler Inhale 2 puffs into the lungs every 6 (six) hours as needed. For shortness of breath       . albuterol (PROVENTIL) (2.5 MG/3ML) 0.083% nebulizer solution Take 2.5 mg by nebulization every 6 (six) hours as needed.        . ALPRAZolam (XANAX) 0.5 MG tablet Take 0.5 mg by mouth 2 (two) times daily.        . citalopram (CELEXA) 40 MG tablet Take 40 mg by mouth daily.        . cyclobenzaprine (FLEXERIL) 10 MG tablet Take 10 mg by mouth 3 (three) times daily as needed. Muscle spasms        . diclofenac (VOLTAREN) 75 MG EC tablet Take 75 mg by mouth 2 (two) times daily.        Elwin Sleight 200-5 MCG/ACT AERO as directed.      . gabapentin (NEURONTIN) 300 MG capsule Take 300 mg by mouth 3 (three) times daily.        Marland Kitchen ipratropium (ATROVENT) 0.02 % nebulizer solution Take 500 mcg by nebulization 4 (four) times daily as needed. For shortness of breath       . ipratropium-albuterol (DUONEB) 0.5-2.5 (3) MG/3ML SOLN As needed      .  metFORMIN (GLUCOPHAGE-XR) 500 MG 24 hr tablet Take 2 tablets by mouth Twice daily.      . montelukast (SINGULAIR) 10 MG tablet as needed.      Marland Kitchen omeprazole (PRILOSEC) 20 MG capsule Take 1 tablet by mouth Twice daily.      Marland Kitchen topiramate (TOPAMAX) 25 MG capsule Take 25 mg by mouth at bedtime.        . traMADol-acetaminophen (ULTRACET) 37.5-325 MG per tablet Take 1 tablet by mouth every 6 (six) hours as needed. pain         Past Medical History  Diagnosis Date  . Diabetes mellitus   . Asthma   . Chronic back pain   . Hypercholesteremia   . Cancer     Past Surgical History  Procedure Date  . Cholecystectomy   . Tonsillectomy   . Shoulder surg   . Back surgery   . Knee surgery   . Foot surgery   . Wrist surgery   . Appendectomy   . Abdominal hysterectomy     No family history on file.  History   Social History  . Marital Status: Married  Spouse Name: N/A    Number of Children: N/A  . Years of Education: N/A   Occupational History  . Not on file.   Social History Main Topics  . Smoking status: Never Smoker   . Smokeless tobacco: Not on file  . Alcohol Use: No  . Drug Use: No  . Sexually Active:    Other Topics Concern  . Not on file   Social History Narrative  . No narrative on file    Review of Systems:  All systems reviewed.  They are negative to the above problem except as previously stated.  Vital Signs: BP 141/92  Pulse 87  Ht 5\' 1"  (1.549 m)  Wt 241 lb (109.317 kg)  BMI 45.54 kg/m2  Physical Exam Patient is in NAD  HEENT:  Normocephalic, atraumatic. EOMI, PERRLA.  Neck: JVP is normal. No thyromegaly. No bruits.  Lungs: clear to auscultation. No rales.  Rare wheeze.Marland Kitchen  Heart: Regular rate and rhythm. Normal S1, S2. No S3.   No significant murmurs. PMI not displaced.  Abdomen:  Supple, nontender. Normal bowel sounds. No masses. No hepatomegaly.  Extremities:   Good distal pulses throughout. No lower extremity edema.  Musculoskeletal :moving all  extremities.  Neuro:   alert and oriented x3.  CN II-XII grossly intact.  EKG:  NSR.IWMI.  Nonspecifc ST Twave changes.  Assessment and Plan:

## 2011-08-24 NOTE — Assessment & Plan Note (Signed)
Will set up for fasting lipids.

## 2011-08-24 NOTE — Patient Instructions (Addendum)
Your physician has requested that you have an echocardiogram. Echocardiography is a painless test that uses sound waves to create images of your heart. It provides your doctor with information about the size and shape of your heart and how well your heart's chambers and valves are working. This procedure takes approximately one hour. There are no restrictions for this procedure.  Your physician has requested that you have a dobutamine myoview. For furth information please visit [email or web address] Please follow instruction sheet, as given.  Fasting Lipid Panel

## 2011-09-05 LAB — COMPREHENSIVE METABOLIC PANEL
ALT: 46 — ABNORMAL HIGH
Calcium: 8.7
Creatinine, Ser: 0.76
Glucose, Bld: 122 — ABNORMAL HIGH
Sodium: 138
Total Protein: 6.8

## 2011-09-05 LAB — CBC
HCT: 37.2
Hemoglobin: 12.8
MCHC: 33
MCV: 86.9
RBC: 4.28
RDW: 14.4
WBC: 5.3

## 2011-09-05 LAB — APTT: aPTT: 54 — ABNORMAL HIGH

## 2011-09-05 LAB — BASIC METABOLIC PANEL
Chloride: 104
GFR calc Af Amer: >60
Potassium: 4.1
Sodium: 139

## 2011-09-05 LAB — POCT CARDIAC MARKERS
Operator id: 205141
Troponin i, poc: <0.05

## 2011-09-05 LAB — HEPARIN LEVEL (UNFRACTIONATED)
Heparin Unfractionated: 0.25 — ABNORMAL LOW
Heparin Unfractionated: 0.5

## 2011-09-05 LAB — CARDIAC PANEL(CRET KIN+CKTOT+MB+TROPI)
CK, MB: 2.7
CK, MB: 3.5
CK, MB: 3.9
Relative Index: 2.5
Relative Index: 3.1 — ABNORMAL HIGH
Troponin I: 0.01

## 2011-09-05 LAB — LIPID PANEL
Cholesterol: 214 — ABNORMAL HIGH
LDL Cholesterol: 146 — ABNORMAL HIGH
Triglycerides: 117
VLDL: 23

## 2011-09-05 LAB — PROTIME-INR
INR: 0.9
Prothrombin Time: 12.3

## 2011-09-05 LAB — TSH: TSH: 2.395

## 2011-09-12 ENCOUNTER — Encounter: Payer: Self-pay | Admitting: *Deleted

## 2011-09-19 ENCOUNTER — Other Ambulatory Visit: Payer: Self-pay | Admitting: Internal Medicine

## 2011-09-19 ENCOUNTER — Ambulatory Visit (HOSPITAL_BASED_OUTPATIENT_CLINIC_OR_DEPARTMENT_OTHER): Payer: Medicare Other

## 2011-09-19 ENCOUNTER — Other Ambulatory Visit (INDEPENDENT_AMBULATORY_CARE_PROVIDER_SITE_OTHER): Payer: Medicare Other | Admitting: *Deleted

## 2011-09-19 ENCOUNTER — Ambulatory Visit (HOSPITAL_COMMUNITY): Payer: Medicare Other | Attending: Internal Medicine | Admitting: Radiology

## 2011-09-19 DIAGNOSIS — E119 Type 2 diabetes mellitus without complications: Secondary | ICD-10-CM | POA: Insufficient documentation

## 2011-09-19 DIAGNOSIS — E669 Obesity, unspecified: Secondary | ICD-10-CM | POA: Insufficient documentation

## 2011-09-19 DIAGNOSIS — I379 Nonrheumatic pulmonary valve disorder, unspecified: Secondary | ICD-10-CM | POA: Insufficient documentation

## 2011-09-19 DIAGNOSIS — E785 Hyperlipidemia, unspecified: Secondary | ICD-10-CM | POA: Insufficient documentation

## 2011-09-19 DIAGNOSIS — J45909 Unspecified asthma, uncomplicated: Secondary | ICD-10-CM | POA: Insufficient documentation

## 2011-09-19 DIAGNOSIS — R072 Precordial pain: Secondary | ICD-10-CM

## 2011-09-19 DIAGNOSIS — I079 Rheumatic tricuspid valve disease, unspecified: Secondary | ICD-10-CM | POA: Insufficient documentation

## 2011-09-19 DIAGNOSIS — R0602 Shortness of breath: Secondary | ICD-10-CM

## 2011-09-19 DIAGNOSIS — I080 Rheumatic disorders of both mitral and aortic valves: Secondary | ICD-10-CM | POA: Insufficient documentation

## 2011-09-19 DIAGNOSIS — R079 Chest pain, unspecified: Secondary | ICD-10-CM | POA: Insufficient documentation

## 2011-09-19 DIAGNOSIS — R55 Syncope and collapse: Secondary | ICD-10-CM

## 2011-09-19 LAB — LDL CHOLESTEROL, DIRECT: Direct LDL: 178.1 mg/dL

## 2011-09-19 LAB — LIPID PANEL
Cholesterol: 258 mg/dL — ABNORMAL HIGH (ref 0–200)
HDL: 62.6 mg/dL (ref >39.00)
Triglycerides: 111 mg/dL (ref 0.0–149.0)

## 2011-09-19 MED ORDER — DOBUTAMINE HCL 250 MG/20ML IV SOLN
40.0000 ug/kg | INTRAVENOUS | Status: DC
  Administered 2011-09-19: 40 mg/kg/min via INTRAVENOUS

## 2011-09-19 MED ORDER — TECHNETIUM TC 99M TETROFOSMIN IV KIT
33.0000 | PACK | Freq: Once | INTRAVENOUS | Status: AC | PRN
  Administered 2011-09-19: 33 via INTRAVENOUS

## 2011-09-19 NOTE — Progress Notes (Signed)
[facility] SITE 3 NUCLEAR MED [address] [place] [address] [phone]  Cardiology Nuclear Med Study  Alexis Rios is a 65 y.o. female [id number] 1946/01/22   Nuclear Med Background Indication for Stress Test:  Evaluation for Ischemia History:  Asthma, 04/09 EF 60% Heart Catheterization and 10/08/06 Myocardial Perfusion Study: EF 65%  Cardiac Risk Factors: Hypertension, Lipids and NIDDM  Symptoms:  Chest Pain, Dizziness, Near Syncope, Palpitations and SOB   Nuclear Pre-Procedure Caffeine/Decaff Intake:  None NPO After: 7:30pm   Lungs:  clear IV 0.9% NS with Angio Cath:  20g  IV Site: R Antecubital  IV Started by:  Bonnita Levan, RN  Chest Size (in):  42 Cup Size: D  Height: 5\' 1"  (1.549 m)  Weight:  245 lb (111.131 kg)  BMI:  Body mass index is 46.29 kg/(m^2). Tech Comments:  BS 98 this AM    Nuclear Med Study 1 or 2 day study: 2 day  Stress Test Type:  Dobutamine  Reading MD: Rozetta Stumpp,M.D. Order Authorizing Provider:  Dr. Lovina Reach  Resting Radionuclide: Technetium 33m Tetrofosmin  Resting Radionuclide Dose: 33.0 mCi   Stress Radionuclide:  Technetium 62m Tetrofosmin  Stress Radionuclide Dose: 33.0 mCi           Stress Protocol Rest HR: 76 Stress HR: 130  Rest BP: 116/76 Stress BP: 132/38  Exercise Time (min): 16:12 METS: n/a   Predicted Max HR: 154 bpm % Max HR: 84.42 bpm Rate Pressure Product: 21308   Dose of Adenosine (mg):  n/a Dose of Lexiscan: n/a mg  Dose of Atropine (mg): n/a Dose of Dobutamine: 40.0 mcg/kg/min (at max HR)  Stress Test Technologist: Milana Na, EMT-P  Nuclear Technologist:  Domenic Polite, CNMT     Rest Procedure:  Myocardial perfusion imaging was performed at rest 45 minutes following the intravenous administration of Technetium 48m Tetrofosmin. Rest ECG: SR V-Bigeminy  Stress Procedure:  The patient received IV dobutamine and no IV atropine.  There were no significant changes and freq  pvcs/V-Bigeminy with infusion.  Technetium 37m Tetrofosmin was injected at peak heart rate and quantitative spect images were obtained after a 45 minute delay. Stress ECG: No significant change from baseline ECG  QPS Raw Data Images:  Normal; no motion artifact; normal heart/lung ratio. Stress Images:  Normal homogeneous uptake in all areas of the myocardium. Rest Images:  Normal homogeneous uptake in all areas of the myocardium. Subtraction (SDS):  There is no evidence of scar or ischemia. Transient Ischemic Dilatation (Normal <1.22):  0.92 Lung/Heart Ratio (Normal <0.45):  0.29  Quantitative Gated Spect Images QGS EDV:  NA QGS ESV:  NA QGS cine images:NA QGS EF: Study not gated  Impression Exercise Capacity:  Dobutamine study with no exercise. BP Response:  Normal blood pressure response. Clinical Symptoms:  Lightheaded, short of breath.  ECG Impression:  Frequent PVCs.  No ST segment changes.  Comparison with Prior Nuclear Study: No images to compare  Overall Impression:  Normal stress nuclear study. Roston Grunewald [employer]

## 2011-09-20 ENCOUNTER — Ambulatory Visit (HOSPITAL_COMMUNITY): Payer: Medicare Other | Attending: Internal Medicine | Admitting: Radiology

## 2011-09-20 DIAGNOSIS — R0989 Other specified symptoms and signs involving the circulatory and respiratory systems: Secondary | ICD-10-CM

## 2011-09-20 MED ORDER — TECHNETIUM TC 99M TETROFOSMIN IV KIT
33.0000 | PACK | Freq: Once | INTRAVENOUS | Status: AC | PRN
  Administered 2011-09-20: 33 via INTRAVENOUS

## 2011-09-22 LAB — COMPREHENSIVE METABOLIC PANEL
AST: 27
Albumin: 3.4 — ABNORMAL LOW
Alkaline Phosphatase: 112
BUN: 15
CO2: 31
Creatinine, Ser: 1.07
Glucose, Bld: 125 — ABNORMAL HIGH
Sodium: 138
Total Bilirubin: 0.8

## 2011-09-22 LAB — CBC
HCT: 36.8
Hemoglobin: 12.4
MCHC: 33.6
MCV: 86.1
RBC: 4.27
RDW: 14.6 — ABNORMAL HIGH

## 2011-09-22 LAB — URINALYSIS, ROUTINE W REFLEX MICROSCOPIC
Bilirubin Urine: NEGATIVE
Glucose, UA: NEGATIVE
Hgb urine dipstick: NEGATIVE
Ketones, ur: NEGATIVE
Nitrite: NEGATIVE
Specific Gravity, Urine: <1.005 — ABNORMAL LOW
pH: 5.5

## 2011-09-22 LAB — DIFFERENTIAL
Basophils Absolute: 0
Eosinophils Relative: 3
Lymphocytes Relative: 42
Monocytes Absolute: 0.7

## 2011-10-01 ENCOUNTER — Ambulatory Visit: Payer: Medicare Other | Attending: Allergy and Immunology | Admitting: Sleep Medicine

## 2011-10-01 DIAGNOSIS — G4733 Obstructive sleep apnea (adult) (pediatric): Secondary | ICD-10-CM | POA: Insufficient documentation

## 2011-10-01 DIAGNOSIS — G4730 Sleep apnea, unspecified: Secondary | ICD-10-CM

## 2011-10-01 DIAGNOSIS — Z6841 Body Mass Index (BMI) 40.0 and over, adult: Secondary | ICD-10-CM | POA: Insufficient documentation

## 2011-10-09 NOTE — Procedures (Signed)
NAMESHAMIEKA, GULLO               ACCOUNT NO.:  [id number]  MEDICAL RECORD NO.:  [id number]          PATIENT TYPE:  OUT  LOCATION:  SLEEP LAB                     FACILITY:  [facility]  PHYSICIAN:  Barba Solt A. Gerilyn Pilgrim, M.D. DATE OF BIRTH:  Oct 10, 1946  DATE OF STUDY:  10/01/2011                           NOCTURNAL POLYSOMNOGRAM  REFERRING PHYSICIAN:  Jessica Priest, M.D.  INDICATION:  A 65 year old who presents with hypersomnia, fatigue and snoring.  The study is being done to evaluate for obstructive sleep apnea syndrome.  MEDICATIONS:  Vitamin C.  FINDINGS:  Epworth Sleepiness Scale 12.  BMI 46.  ARCHITECTURAL SUMMARY:  Total recording time is 409 minutes.  Sleep efficiency is 82%.  Sleep latency 47 minutes.  REM latency 145 minutes. Stage N1 4.8%, N2 44.8%, N3 36.8%, and REM sleep 14.0%.  RESPIRATORY SUMMARY:  Baseline oxygen saturation is 94.  Lowest saturation 76 during REM sleep.  Diagnostic AHI is 25 and RDI 26.  More events occurred during REM sleep with the REM AHI being 55.  LIMB MOVEMENT SUMMARY:  PLM index 0.  She had significant phasic EMG activity both during REM and non-REM sleep.  This can be seen with REM sleep behavior disorder.  ELECTROCARDIOGRAM SUMMARY:  Average heart rate is 72.  There were frequent PVCs noted throughout the recording.  IMPRESSION: 1. Moderate obstructive sleep apnea syndrome. 2. Frequent premature ventricular contractions. 3. Frequent phasic EMG activity/fragmentary myoclonus activity.  RECOMMENDATION:  Formal CPAP titration study.  Thanks for this referral.    Lulamae Skorupski A. Gerilyn Pilgrim, M.D.    KAD/MEDQ  D:  10/09/2011 09:10:34  T:  10/09/2011 10:02:08  Job:  [id number]

## 2011-10-13 ENCOUNTER — Other Ambulatory Visit: Payer: Self-pay | Admitting: *Deleted

## 2011-10-13 DIAGNOSIS — E782 Mixed hyperlipidemia: Secondary | ICD-10-CM

## 2011-10-19 ENCOUNTER — Ambulatory Visit: Payer: Medicare Other

## 2011-10-19 ENCOUNTER — Ambulatory Visit (INDEPENDENT_AMBULATORY_CARE_PROVIDER_SITE_OTHER): Payer: Medicare Other

## 2011-10-19 VITALS — Wt 246.0 lb

## 2011-10-19 DIAGNOSIS — E785 Hyperlipidemia, unspecified: Secondary | ICD-10-CM

## 2011-10-19 MED ORDER — ROSUVASTATIN CALCIUM 5 MG PO TABS: ORAL_TABLET | ORAL | Status: DC

## 2011-10-19 NOTE — Patient Instructions (Signed)
Start Crestor 5mg  daily on Monday, Wednesday and Friday.  If you have any problems, please call Kennon Rounds at [phone]  Diet Plans: 1. Cut out all fried foods 2. Try to cut down on snacks.  Use fresh fruits or almonds as snacks rather than Little Debbie snacks or chips  3. Eat only 1 starchy vegetable per meal  Exercise Plan:  1. Try to walk back and forth from driveway or down you road as much as possible 2. Try the [employer] program at the [place].  Our goal is to do some type of physical activity at least 2-3 days per week.   Recheck labs in 1 month.

## 2011-10-24 NOTE — Assessment & Plan Note (Signed)
Pt's cholesterol poorly controlled on no medications.  TC- 258 (goal<200), TG- 111 (goal<150), HDL- 62 (goal>45), and LDL- 178 (goal<70).  Pt prefers no medications, but given % reduction in LDL needed, explained to her that medication would be a must.  Discussed all options and pt is willing to try Crestor 5mg  on Monday, Wednesday and Friday.  Pt has room for major improvements in diet.  Will focus on only a few things for now- fried foods, carbohydrates, and snacks.  I have asked the patient to start trying to just walk back and forth to her mailbox to help with her conditioning.

## 2011-10-24 NOTE — Progress Notes (Signed)
HPI  Alexis Rios is a 65 yo F who comes to Lipid clinic today for initial evaluation.  She is accompanied by her husband.  She was referred by Dr. Tenny Craw.  Her last cholesterol panel showed significantly elevated LDL.  Pt has tried several medications in the past.  She does not remember all of them but based on what little history is available, she has had muscle aches/cramps with Lipitor and Tricor and flushing with Niacin. She does not want to take any medications and would prefer diet and exercise alone. PMH significant for DM.  She has a very strong family history including a Mom with CABG at 36 and MI at 25. Father died of stroke at 1.  She has 3 brothers and 1 sister.  She is not as sure about their medical history.    Review of patient's diet shows lots of room for improvement.  She likes to sleep in late so she does not eat breakfast until around 11:00am.  She does eat breakfast on a daily basis but when she does, she likes cornflakes, Cheerios or Raisin Bran.  Every once in a while she will have pancakes.  Lunch is around 2pm every day.  She and her husband like to go out for lunch and dinner about 1/2 of the time.  When she goes out, she gets fried chicken, mashed potatoes, dumplings, fried okra and squash, green beans, biscuits, etc.  They like to go to cafeteria like restaurants or [employer].  Dinner is similar to lunch.  They have a pizza place that serves [place] cheese steaks and a [place] restaurant that are 2 of their favorites.  She does snack during the day, often on Little Debbies, chips, peanuts, or raisins.  She likes to eat chocolate ice cream 2-3 nights per week.  She will drink unsweet tea with Equal.    Pt does not have any exercise routine currently.  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   Pt does check her blood sugar at home sometimes, but not on a daily basis.  She states her fasting numbers are usually  around 120.    Current Outpatient Prescriptions  Medication Sig Dispense Refill  . albuterol (PROVENTIL HFA;VENTOLIN HFA) 108 (90 BASE) MCG/ACT inhaler Inhale 2 puffs into the lungs every 6 (six) hours as needed. For shortness of breath       . albuterol (PROVENTIL) (2.5 MG/3ML) 0.083% nebulizer solution Take 2.5 mg by nebulization every 6 (six) hours as needed.        . ALPRAZolam (XANAX) 0.5 MG tablet Take 0.5 mg by mouth 2 (two) times daily.        . citalopram (CELEXA) 40 MG tablet Take 40 mg by mouth daily.        . cyclobenzaprine (FLEXERIL) 10 MG tablet Take 10 mg by mouth 3 (three) times daily as needed. Muscle spasms        . diclofenac (VOLTAREN) 75 MG EC tablet Take 75 mg by mouth 2 (two) times daily.        Elwin Sleight 200-5 MCG/ACT AERO as directed.      . gabapentin (NEURONTIN) 300 MG capsule Take 300 mg by mouth 3 (three) times daily.        Marland Kitchen ipratropium (ATROVENT) 0.02 % nebulizer solution Take 500 mcg by nebulization 4 (four) times daily as needed. For shortness of breath       .  ipratropium-albuterol (DUONEB) 0.5-2.5 (3) MG/3ML SOLN As needed      . metFORMIN (GLUCOPHAGE-XR) 500 MG 24 hr tablet Take 2 tablets by mouth Twice daily.      . montelukast (SINGULAIR) 10 MG tablet as needed.      Marland Kitchen omeprazole (PRILOSEC) 20 MG capsule Take 1 tablet by mouth Twice daily.      Marland Kitchen topiramate (TOPAMAX) 25 MG capsule Take 25 mg by mouth at bedtime.        . traMADol-acetaminophen (ULTRACET) 37.5-325 MG per tablet Take 1 tablet by mouth every 6 (six) hours as needed. pain       . rosuvastatin (CRESTOR) 5 MG tablet Take 1 tablet by mouth on Monday, Wednesday and Friday  14 tablet  0    Allergies  Allergen Reactions  . Codeine Itching  . Cymbalta (Duloxetine Hcl) Other (See Comments)    Can not urinate  . Keflet (Cephalexin Monohydrate) Itching  . Sulfa Antibiotics Swelling

## 2011-11-12 ENCOUNTER — Ambulatory Visit (HOSPITAL_BASED_OUTPATIENT_CLINIC_OR_DEPARTMENT_OTHER): Payer: Medicare Other | Attending: Allergy and Immunology

## 2011-11-12 VITALS — Ht 61.0 in | Wt 245.0 lb

## 2011-11-12 DIAGNOSIS — R0989 Other specified symptoms and signs involving the circulatory and respiratory systems: Secondary | ICD-10-CM | POA: Insufficient documentation

## 2011-11-12 DIAGNOSIS — R0609 Other forms of dyspnea: Secondary | ICD-10-CM | POA: Insufficient documentation

## 2011-11-12 DIAGNOSIS — G4700 Insomnia, unspecified: Secondary | ICD-10-CM | POA: Insufficient documentation

## 2011-11-12 DIAGNOSIS — Z9989 Dependence on other enabling machines and devices: Secondary | ICD-10-CM

## 2011-11-12 DIAGNOSIS — I498 Other specified cardiac arrhythmias: Secondary | ICD-10-CM | POA: Insufficient documentation

## 2011-11-12 DIAGNOSIS — I4949 Other premature depolarization: Secondary | ICD-10-CM | POA: Insufficient documentation

## 2011-11-18 DIAGNOSIS — R0989 Other specified symptoms and signs involving the circulatory and respiratory systems: Secondary | ICD-10-CM

## 2011-11-18 DIAGNOSIS — G4700 Insomnia, unspecified: Secondary | ICD-10-CM

## 2011-11-18 DIAGNOSIS — R0609 Other forms of dyspnea: Secondary | ICD-10-CM

## 2011-11-18 DIAGNOSIS — I4949 Other premature depolarization: Secondary | ICD-10-CM

## 2011-11-18 DIAGNOSIS — G4730 Sleep apnea, unspecified: Secondary | ICD-10-CM

## 2011-11-18 DIAGNOSIS — I498 Other specified cardiac arrhythmias: Secondary | ICD-10-CM

## 2011-11-18 NOTE — Procedures (Signed)
Alexis Rios, Alexis Rios               ACCOUNT NO.:  [id number]  MEDICAL RECORD NO.:  [id number]          PATIENT TYPE:  OUT  LOCATION:  SLEEP CENTER                 FACILITY:  [facility]  PHYSICIAN:  Siri Buege D. Maple Hudson, MD, FCCP, FACPDATE OF BIRTH:  02/24/44  DATE OF STUDY:  11/12/2011                           NOCTURNAL POLYSOMNOGRAM  REFERRING PHYSICIAN:  Jessica Priest, M.D.  REFERRING PHYSICIAN:  Jessica Priest, MD  INDICATION FOR STUDY:  Insomnia with sleep apnea.  EPWORTH SLEEPINESS SCORE:  2/24.  BMI 46.5, weight 246 pounds, height 61 inches, neck 15.5 inches.  Home medications are charted and reviewed as vitamin C.  A diagnostic and PSG on October 01, 2011 at [facility] recorded AHI of 25 per hour.  She was wearing oxygen at 2 L/minute.  CPAP titration is requested.  SLEEP ARCHITECTURE:  Total sleep time 309 minutes with sleep efficiency 82.8%.  Stage I was 25.1%, stage II 60.7%, stage III absent. REM 14.2% of total sleep time.  Sleep latency 38.5 minutes, REM latency 151 minutes, awake after sleep onset 25.5 minutes.  Arousal index 6.4. Bedtime medication:  None.  RESPIRATORY DATA:  CPAP titration protocol.  CPAP was titrated from 5 to a final pressure of 12 CWP, AHI 1 per hour.  She wore a small ResMed Mirage FX mask with heated humidifier and C-flex of 3.  OXYGEN DATA:  Moderate snoring at the beginning was prevented by final CPAP pressure.  She did not wear supplemental oxygen during this study. She desaturated to a nadir of 86% with mean oxygen saturation through the study of 91.4% on room air.  21 minutes was recorded during the total recording time, with saturation less than 90% and 1.6 minutes recorded with saturation less than 88%.  CARDIAC DATA:  Sinus rhythm with PVCs including some ventricular bigeminy.  MOVEMENT/PARASOMNIA:  No significant movement disturbance.  No bathroom trips.  IMPRESSION/RECOMMENDATION: 1. Successful CPAP titration to 12 CWP,  AHI 1 per hour.  She wore a     small ResMed Mirage FX mask with C-flex setting of 3.  The patient     requested the titration be done without a humidifier. 2. CPAP titration was performed without supplemental oxygen.  She     desaturated to a nadir of 86% with a mean oxygen saturation through     the study of 91.4% on room air and a total of 1.6 minutes recorded     of total time with saturation less than 88% on room air.  Note that     she wears oxygen at 2 L at home and wore it for her     original diagnostic sleep study. 3. Baseline diagnostic NPSG on October 01, 2011 recorded AHI of 25 per     hour.     Deontrey Massi D. Maple Hudson, MD, [facility], FACP Diplomate, Biomedical engineer of Sleep Medicine Electronically Signed    CDY/MEDQ  D:  11/18/2011 10:09:30  T:  11/18/2011 11:28:02  Job:  [id number]

## 2011-11-24 ENCOUNTER — Other Ambulatory Visit: Payer: Self-pay | Admitting: Internal Medicine

## 2011-11-25 LAB — LIPID PANEL
HDL: 66 mg/dL (ref >39)
LDL Cholesterol: 101 mg/dL — ABNORMAL HIGH (ref 0–99)
VLDL: 17 mg/dL (ref 0–40)

## 2011-11-25 LAB — HEPATIC FUNCTION PANEL
Albumin: 3.8 g/dL (ref 3.5–5.2)
Alkaline Phosphatase: 83 U/L (ref 39–117)
Total Bilirubin: 0.4 mg/dL (ref 0.3–1.2)

## 2011-11-30 ENCOUNTER — Ambulatory Visit (INDEPENDENT_AMBULATORY_CARE_PROVIDER_SITE_OTHER): Payer: Medicare Other

## 2011-11-30 VITALS — Wt 246.0 lb

## 2011-11-30 DIAGNOSIS — E785 Hyperlipidemia, unspecified: Secondary | ICD-10-CM

## 2011-11-30 NOTE — Assessment & Plan Note (Signed)
Pt's cholesterol significantly improved with 3 day a week statin.  TC- 184 (goal<200), TG- 83 (goal<150), HDL- 66 (goal>45), and LDL- 101 (goal<70).  LFTs are WNL.  Given her list of intolerances to statins, will continue with 3 day a week statin for now.  If LDL remains elevated at the next visit, will need to increase to 4-5 days per week.  Encouraged continued lifestyle changes with diet and exercise.  Will f/u in 2 months.

## 2011-11-30 NOTE — Progress Notes (Signed)
HPI  Alexis Rios is a 65 yo F who comes to Lipid clinic today for follow-up evaluation.  She is accompanied by her husband.  At the last visit, she was started on Crestor 5mg  on Monday, Wednesday and Friday.  She reports compliance with medication and has not had any unusual muscle aches or cramps.  She has not been consistently checking her blood sugar at home but the last time she checked, it was 126.    Review of patient's diet shows she has made significant improvements.  She has cut down on the red meat she eats, specifically steak.  She started watching her carbohydrates and will only have 1 carbohydrate per meal.  She has not had Little Debbies or ice cream in the house since last visit.  She has started eating fresh fruit for snacks.    Pt does not have any exercise routine currently.  She has tried to walk more around the grocery store and house but still feels as if she gets out of breath very quickly.  Since last visit she has had problems with her shoulder.      Current Outpatient Prescriptions  Medication Sig Dispense Refill  . albuterol (PROVENTIL HFA;VENTOLIN HFA) 108 (90 BASE) MCG/ACT inhaler Inhale 2 puffs into the lungs every 6 (six) hours as needed. For shortness of breath       . albuterol (PROVENTIL) (2.5 MG/3ML) 0.083% nebulizer solution Take 2.5 mg by nebulization every 6 (six) hours as needed.        . ALPRAZolam (XANAX) 0.5 MG tablet Take 0.5 mg by mouth 2 (two) times daily.        . citalopram (CELEXA) 40 MG tablet Take 40 mg by mouth daily.        . cyclobenzaprine (FLEXERIL) 10 MG tablet Take 10 mg by mouth 3 (three) times daily as needed. Muscle spasms        . diclofenac (VOLTAREN) 75 MG EC tablet Take 75 mg by mouth 2 (two) times daily.        Elwin Sleight 200-5 MCG/ACT AERO as directed.      . gabapentin (NEURONTIN) 300 MG capsule Take 300 mg by mouth 3 (three) times daily.        Marland Kitchen ipratropium (ATROVENT) 0.02 % nebulizer solution Take 500 mcg by nebulization 4 (four)  times daily as needed. For shortness of breath       . ipratropium-albuterol (DUONEB) 0.5-2.5 (3) MG/3ML SOLN As needed      . metFORMIN (GLUCOPHAGE-XR) 500 MG 24 hr tablet Take 2 tablets by mouth Twice daily.      . montelukast (SINGULAIR) 10 MG tablet as needed.      Marland Kitchen omeprazole (PRILOSEC) 20 MG capsule Take 1 tablet by mouth Twice daily.      . rosuvastatin (CRESTOR) 5 MG tablet Take 1 tablet by mouth on Monday, Wednesday and Friday  14 tablet  0  . topiramate (TOPAMAX) 25 MG capsule Take 25 mg by mouth at bedtime.        . traMADol-acetaminophen (ULTRACET) 37.5-325 MG per tablet Take 1 tablet by mouth every 6 (six) hours as needed. pain         Allergies  Allergen Reactions  . Codeine Itching  . Cymbalta (Duloxetine Hcl) Other (See Comments)    Can not urinate  . Keflet (Cephalexin Monohydrate) Itching  . Sulfa Antibiotics Swelling

## 2011-11-30 NOTE — Patient Instructions (Addendum)
Continue Crestor 5mg  on Monday, Wednesday and Friday  Great job on the improvements in [employer].  Continue to eat fruits for snacks, watch your carbohydrates and cut down on fried foods.  Continue to walk as much as possible.  Recheck labs in 2 months in [place].

## 2012-01-26 ENCOUNTER — Other Ambulatory Visit: Payer: Self-pay | Admitting: Internal Medicine

## 2012-01-26 LAB — HEPATIC FUNCTION PANEL
ALT: 19 U/L (ref 0–35)
AST: 18 U/L (ref 0–37)
Albumin: 3.7 g/dL (ref 3.5–5.2)
Total Protein: 6.4 g/dL (ref 6.0–8.3)

## 2012-01-26 LAB — LIPID PANEL
Cholesterol: 184 mg/dL (ref 0–200)
HDL: 61 mg/dL (ref >39)
Total CHOL/HDL Ratio: 3 Ratio
Triglycerides: 120 mg/dL (ref <150)

## 2012-02-01 ENCOUNTER — Ambulatory Visit (INDEPENDENT_AMBULATORY_CARE_PROVIDER_SITE_OTHER): Payer: Medicare Other | Admitting: Pharmacist

## 2012-02-01 ENCOUNTER — Encounter: Payer: Self-pay | Admitting: Pharmacist

## 2012-02-01 VITALS — Wt 254.0 lb

## 2012-02-01 DIAGNOSIS — E785 Hyperlipidemia, unspecified: Secondary | ICD-10-CM

## 2012-02-01 NOTE — Progress Notes (Signed)
HPI  Alexis Rios is a 66 yo F who comes to Lipid clinic today for follow-up evaluation.  She is accompanied by her husband.  At the last visit, she was started on Crestor 5mg  on Monday, Wednesday and Friday.  She reports compliance with medication and has not had any unusual muscle aches or cramps.  Patient is concerned that she cannot afford Crestor (~$45/month).  Her blood sugar this morning was 105.    Review of patient's diet shows that she often skips breakfast, but may have oatmeal or a boiled egg with toast occasionally.  For lunch, patient may have a [place] sandwich on white bread (due to whole wheat bread being more expensive).  For dinner, she may have baked or BBQ chicken with green beans, English peas, or mashed potatoes.  She drinks unsweetened tea most of the time.  She does not snack.  Patient eats out about 2-3x/week.    Pt does not have any exercise routine currently.  She has tried to walk more around the grocery store and house but still feels as if she gets out of breath very quickly.  Patient reports having to use her inhaler frequently when she walks.  When we checked her O2 sats while she walked around the office, they were always >90%.  As her HR increases to around a 100, it was then that the patient reported getting short of breath.  Current Outpatient Prescriptions  Medication Sig Dispense Refill  . albuterol (PROVENTIL HFA;VENTOLIN HFA) 108 (90 BASE) MCG/ACT inhaler Inhale 2 puffs into the lungs every 6 (six) hours as needed. For shortness of breath       . albuterol (PROVENTIL) (2.5 MG/3ML) 0.083% nebulizer solution Take 2.5 mg by nebulization every 6 (six) hours as needed.        . ALPRAZolam (XANAX) 0.5 MG tablet Take 0.5 mg by mouth 2 (two) times daily.        . citalopram (CELEXA) 40 MG tablet Take 40 mg by mouth daily.        . cyclobenzaprine (FLEXERIL) 10 MG tablet Take 10 mg by mouth 3 (three) times daily as needed. Muscle spasms        . diclofenac (VOLTAREN) 75  MG EC tablet Take 75 mg by mouth 2 (two) times daily.        Elwin Sleight 200-5 MCG/ACT AERO Take 2 puffs by mouth 2 (two) times daily.       Marland Kitchen gabapentin (NEURONTIN) 300 MG capsule Take 300 mg by mouth 3 (three) times daily.        Marland Kitchen ipratropium (ATROVENT) 0.02 % nebulizer solution Take 500 mcg by nebulization 4 (four) times daily as needed. For shortness of breath       . metFORMIN (GLUCOPHAGE-XR) 500 MG 24 hr tablet Take 2 tablets by mouth Twice daily.      . montelukast (SINGULAIR) 10 MG tablet as needed.      Marland Kitchen omeprazole (PRILOSEC) 20 MG capsule Take 1 tablet by mouth Twice daily.      . rosuvastatin (CRESTOR) 5 MG tablet Take 1 tablet by mouth on Monday, Wednesday and Friday  14 tablet  0  . topiramate (TOPAMAX) 25 MG capsule Take 25 mg by mouth at bedtime.        . traMADol-acetaminophen (ULTRACET) 37.5-325 MG per tablet Take 1 tablet by mouth every 6 (six) hours as needed. pain         Allergies  Allergen Reactions  . Codeine Itching  .  Cymbalta (Duloxetine Hcl) Other (See Comments)    Can not urinate  . Keflet (Cephalexin Monohydrate) Itching  . Sulfa Antibiotics Swelling

## 2012-02-01 NOTE — Assessment & Plan Note (Addendum)
Assessment: patient's LDL has remained about the same (99 today, 101 in Dec 2012).  Goal for patient is <70 since she does have diabetes.  TC 184, TG 120 (increase from 83 in Dec 2012), HDL 61, LDL 99; LFTs WNL   Plan: 1.  Discontinue Crestor due to cost.  Initiate pravastatin 20 mg daily. Pravastatin is available as generic and is on many pharmacies' $4 list.    2.  Since patient does get short of breath easily and this does not seem to be related to her O2 sats, encouraged patient to walk about 5 minutes around/in her house a day.  When she gets tired, she can pause but continue on to her 5 minute goal.  After a couple of weeks, encouraged her to increase this to 7 minutes.  This might help with her conditioning.  In addition, if she continues to get short of breath, patient should contact her PCP. 3.  Continue eating a healthy diet.  Encouraged limiting sweets, changing the white bread to whole wheat, and doing frozen/fresh vegetables. 4.  Follow up with a fasting lipid panel and liver function tests in 3 months.

## 2012-02-01 NOTE — Patient Instructions (Signed)
Start Pravastatin 20mg  daily- Take 1/2 tablet of the 40mg  tablet for now.  If you have any problems, please call Kennon Rounds at [phone].   Continue your current diet.  Continue to limit white breads, fried foods, and sweets.   If you continue to get short of breath when you walk, please call your primary care provider.   Recheck labs in 3 months.

## 2012-03-12 LAB — PULMONARY FUNCTION TEST

## 2012-04-04 ENCOUNTER — Telehealth: Payer: Self-pay | Admitting: Internal Medicine

## 2012-04-04 NOTE — Telephone Encounter (Signed)
Patient states she started taken Pravastatin 20 mg once a day on 02/01/12. Last week ,Patient started having terrible cramps all over her body. Cramps  starts in her feet then progressed  to  Legs,  stomach and breast. Patient said that Ranae Plumber PH-D was trying different medications on her, because she has problems taken Statins. Patient has stop taken this medication. Patient states she is still waiting for a call from [facility] for a F/U appointment in the Lipid Clinic.

## 2012-04-04 NOTE — Telephone Encounter (Signed)
Pt calling re med for cholesterol giving her cramps

## 2012-04-04 NOTE — Telephone Encounter (Signed)
Spoke with pt.  She stopped taking the pravastatin yesterday.  I have asked her to call if her symptoms have not improved by next week. The lipid schedule is not available in May yet.  I will call the pt as soon as it is available to reschedule.

## 2012-04-04 NOTE — Telephone Encounter (Signed)
Please make sure patient set up for f/u in lipid clinic.

## 2012-05-07 ENCOUNTER — Encounter: Payer: Self-pay | Admitting: Pulmonary Disease

## 2012-05-07 ENCOUNTER — Ambulatory Visit (INDEPENDENT_AMBULATORY_CARE_PROVIDER_SITE_OTHER): Payer: Medicare Other | Admitting: Pulmonary Disease

## 2012-05-07 ENCOUNTER — Other Ambulatory Visit: Payer: Self-pay | Admitting: Internal Medicine

## 2012-05-07 ENCOUNTER — Ambulatory Visit (INDEPENDENT_AMBULATORY_CARE_PROVIDER_SITE_OTHER)
Admission: RE | Admit: 2012-05-07 | Discharge: 2012-05-07 | Disposition: A | Discharge status: Home / Self Care | Payer: Medicare Other | Source: Ambulatory Visit | Attending: Pulmonary Disease | Admitting: Pulmonary Disease

## 2012-05-07 ENCOUNTER — Other Ambulatory Visit (INDEPENDENT_AMBULATORY_CARE_PROVIDER_SITE_OTHER): Payer: Medicare Other

## 2012-05-07 VITALS — BP 150/72 | HR 96 | Temp 98.1°F | Ht 60.0 in | Wt 256.8 lb

## 2012-05-07 DIAGNOSIS — R0600 Dyspnea, unspecified: Secondary | ICD-10-CM

## 2012-05-07 DIAGNOSIS — R0989 Other specified symptoms and signs involving the circulatory and respiratory systems: Secondary | ICD-10-CM

## 2012-05-07 DIAGNOSIS — R0609 Other forms of dyspnea: Secondary | ICD-10-CM

## 2012-05-07 DIAGNOSIS — G4733 Obstructive sleep apnea (adult) (pediatric): Secondary | ICD-10-CM

## 2012-05-07 LAB — CBC WITH DIFFERENTIAL/PLATELET
Basophils Absolute: 0 10*3/uL (ref 0.0–0.1)
Eosinophils Relative: 0.4 % (ref 0.0–5.0)
Monocytes Relative: 11.8 % (ref 3.0–12.0)
Neutrophils Relative %: 55 % (ref 43.0–77.0)
Platelets: 316 10*3/uL (ref 150.0–400.0)
RDW: 16 % — ABNORMAL HIGH (ref 11.5–14.6)
WBC: 11.2 10*3/uL — ABNORMAL HIGH (ref 4.5–10.5)

## 2012-05-07 LAB — HEPATIC FUNCTION PANEL
AST: 18 U/L (ref 0–37)
Albumin: 4 g/dL (ref 3.5–5.2)
Bilirubin, Direct: 0.1 mg/dL (ref 0.0–0.3)
Total Bilirubin: 0.3 mg/dL (ref 0.3–1.2)

## 2012-05-07 LAB — LIPID PANEL
HDL: 62 mg/dL (ref >39)
LDL Cholesterol: 143 mg/dL — ABNORMAL HIGH (ref 0–99)
Total CHOL/HDL Ratio: 3.6 Ratio

## 2012-05-07 NOTE — Progress Notes (Signed)
Chief Complaint  Patient presents with  . Shortness of Breath    Pt here for Consult- referred by [facility]. Pt states SOB started x 1 yr ago with PNA,  Pt is currently on O2 2LPM nightly with CPAP    History of Present Illness: Alexis Rios is a 66 y.o. female for evaluation of dyspnea.  She had pneumonia one year ago, and has notice trouble with her breathing since.  She now gets short of breath talking.  She can only walk about 100 feet.    She has a history of asthma and has been seen by Dr. Lucie Leather.  She was never on allergy shots.  She has been on prednisone several times, and this does help her breathing.  She has frequent cough with occasional clear sputum to yellow.  Her cough is worst in the morning.  She denies hemoptysis.  She does also get episodes of wheezing from her chest.  She had one other episode of pneumonia years ago.  There is no history of exposure to tuberculosis.  She is from [place].  She denies any recent travel.  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  She never smoked, but she has significant second tobacco exposure from her parents and late husband.    Past Medical History  Diagnosis Date  . Diabetes mellitus   . Asthma   . Chronic back pain   . Hypercholesteremia   . Cancer   . Seasonal allergies   . Skin cancer     scc/bcc  . Chronic headaches   . Sleep apnea     Past Surgical History  Procedure Date  . Cholecystectomy   . Tonsillectomy   . Shoulder surg     x2 R  . Back surgery     x3  . Knee surgery     R  . Foot surgery     R  . Wrist surgery     L  . Appendectomy   . Abdominal hysterectomy     partial  . Breast surgery     lump x 2 R    Current Outpatient Prescriptions on File Prior to Visit  Medication Sig Dispense Refill  . albuterol (PROVENTIL HFA;VENTOLIN HFA) 108 (90 BASE) MCG/ACT inhaler Inhale 2 puffs into the lungs every 6 (six) hours as needed. For shortness of breath       .  albuterol (PROVENTIL) (2.5 MG/3ML) 0.083% nebulizer solution Take 2.5 mg by nebulization every 6 (six) hours as needed.        . ALPRAZolam (XANAX) 0.5 MG tablet Take 0.5 mg by mouth 2 (two) times daily.        Marland Kitchen azelastine (ASTELIN) 137 MCG/SPRAY nasal spray Place 1 spray into both nostrils Twice daily.      . citalopram (CELEXA) 40 MG tablet Take 40 mg by mouth daily.        . diclofenac (VOLTAREN) 75 MG EC tablet Take 75 mg by mouth 2 (two) times daily.        Elwin Sleight 200-5 MCG/ACT AERO Take 2 puffs by mouth 2 (two) times daily.       . fexofenadine (ALLEGRA) 180 MG tablet Take 180 mg by mouth daily.      Marland Kitchen gabapentin (NEURONTIN) 300 MG capsule Take 300 mg by mouth 3 (three) times daily.        Marland Kitchen ipratropium (ATROVENT) 0.02 % nebulizer solution Take 500 mcg  by nebulization 4 (four) times daily as needed. For shortness of breath       . metFORMIN (GLUCOPHAGE-XR) 500 MG 24 hr tablet Take 2 tablets by mouth Twice daily.      Marland Kitchen omeprazole (PRILOSEC) 20 MG capsule Take 1 tablet by mouth Twice daily.      Marland Kitchen topiramate (TOPAMAX) 25 MG capsule Take 25 mg by mouth at bedtime.        . cyclobenzaprine (FLEXERIL) 10 MG tablet Take 10 mg by mouth 3 (three) times daily as needed. Muscle spasms        . traMADol-acetaminophen (ULTRACET) 37.5-325 MG per tablet Take 1 tablet by mouth every 6 (six) hours as needed. pain         Allergies  Allergen Reactions  . Codeine Itching  . Cymbalta (Duloxetine Hcl) Other (See Comments)    Can not urinate  . Keflet (Cephalexin Monohydrate) Itching  . Sulfa Antibiotics Swelling    family history includes Allergies in her daughter; Cancer in her brothers; and Emphysema in her brother.   reports that she has never smoked. She does not have any smokeless tobacco history on file. She reports that she does not drink alcohol or use illicit drugs.  Review of Systems  Constitutional: Positive for unexpected weight change. Negative for fever.  HENT: Positive for  congestion, sore throat, sneezing and sinus pressure. Negative for ear pain, nosebleeds, rhinorrhea, trouble swallowing, dental problem, postnasal drip and ear discharge.   Eyes: Negative for redness and itching.  Respiratory: Positive for cough and shortness of breath. Negative for chest tightness and wheezing.   Cardiovascular: Positive for leg swelling. Negative for palpitations.  Gastrointestinal: Negative for nausea, vomiting and diarrhea.  Genitourinary: Negative for dysuria.  Musculoskeletal: Positive for joint swelling.  Skin: Easy bruising. Neurological: Positive for headaches.  Hematological: Does not bruise/bleed easily.  Psychiatric/Behavioral: Positive for dysphoric mood. The patient is nervous/anxious.     Physical Exam: BP 150/72  Pulse 96  Temp(Src) 98.1 F (36.7 C) (Oral)  Ht 5' (1.524 m)  Wt 256 lb 12.8 oz (116.484 kg)  BMI 50.15 kg/m2  SpO2 94% Body mass index is 50.15 kg/(m^2).  General - Obese HEENT - PERRLA, EOMI, no sinus tenderness, narrow nasal angles, MP 3, no oral exudate, no LAN Cardiac - s1s2 regular, no murmur Chest - decreased respiratory excursion, no wheeze/rales/dullness Abdomen - obese, soft, non tender Extremities - minimal ankle edema Neurologic - normal strength, CN intact  Skin - no rashes Psychiatric - normal mood, behavior  CXR 02/23/12>>decreased lung volumes, mild vascular congestion, tracheal deviation  Dobutamine myoview 08/24/11>>normal nuclear study  Echo 09/19/11>>mild LVH, EF 60 to 65%, grade 1 diastolic dysfunction, mild MR, mild LA dilation   Assessment/Plan:  Outpatient Encounter Prescriptions as of 05/07/2012  Medication Sig Dispense Refill  . albuterol (PROVENTIL HFA;VENTOLIN HFA) 108 (90 BASE) MCG/ACT inhaler Inhale 2 puffs into the lungs every 6 (six) hours as needed. For shortness of breath       . albuterol (PROVENTIL) (2.5 MG/3ML) 0.083% nebulizer solution Take 2.5 mg by nebulization every 6 (six) hours as needed.         . ALPRAZolam (XANAX) 0.5 MG tablet Take 0.5 mg by mouth 2 (two) times daily.        Marland Kitchen azelastine (ASTELIN) 137 MCG/SPRAY nasal spray Place 1 spray into both nostrils Twice daily.      . citalopram (CELEXA) 40 MG tablet Take 40 mg by mouth daily.        Marland Kitchen  diclofenac (VOLTAREN) 75 MG EC tablet Take 75 mg by mouth 2 (two) times daily.        Elwin Sleight 200-5 MCG/ACT AERO Take 2 puffs by mouth 2 (two) times daily.       . fexofenadine (ALLEGRA) 180 MG tablet Take 180 mg by mouth daily.      Marland Kitchen gabapentin (NEURONTIN) 300 MG capsule Take 300 mg by mouth 3 (three) times daily.        Marland Kitchen HYDROcodone-acetaminophen (NORCO) 7.5-325 MG per tablet Take 1-2 tablets by mouth. Every 6 hours as needed for pain      . ipratropium (ATROVENT) 0.02 % nebulizer solution Take 500 mcg by nebulization 4 (four) times daily as needed. For shortness of breath       . metFORMIN (GLUCOPHAGE-XR) 500 MG 24 hr tablet Take 2 tablets by mouth Twice daily.      Marland Kitchen omeprazole (PRILOSEC) 20 MG capsule Take 1 tablet by mouth Twice daily.      Marland Kitchen topiramate (TOPAMAX) 25 MG capsule Take 25 mg by mouth at bedtime.        Marland Kitchen DISCONTD: rosuvastatin (CRESTOR) 5 MG tablet Take 1 tablet by mouth on Monday, Wednesday and Friday  14 tablet  0  . cyclobenzaprine (FLEXERIL) 10 MG tablet Take 10 mg by mouth 3 (three) times daily as needed. Muscle spasms        . traMADol-acetaminophen (ULTRACET) 37.5-325 MG per tablet Take 1 tablet by mouth every 6 (six) hours as needed. pain       . DISCONTD: montelukast (SINGULAIR) 10 MG tablet as needed.        Tavi Hoogendoorn Pager:  [phone] 05/07/2012, 4:16 PM

## 2012-05-07 NOTE — Patient Instructions (Signed)
Will get records from Dr. Patrecia Pace and Dr. Lucie Leather Chest xray and lab tests today Will schedule breathing test (PFT) Follow up in 2 to 3 weeks

## 2012-05-07 NOTE — Progress Notes (Deleted)
  Subjective:    Patient ID: KRISSI WILLAIMS, female    DOB: 26-Jul-1946, 66 y.o.   MRN: [id number]  HPI    Review of Systems  Constitutional: Positive for unexpected weight change. Negative for fever.  HENT: Positive for congestion, sore throat, sneezing and sinus pressure. Negative for ear pain, nosebleeds, rhinorrhea, trouble swallowing, dental problem, postnasal drip and ear discharge.   Eyes: Negative for redness and itching.  Respiratory: Positive for cough and shortness of breath. Negative for chest tightness and wheezing.   Cardiovascular: Positive for leg swelling. Negative for palpitations.  Gastrointestinal: Negative for nausea, vomiting and diarrhea.  Genitourinary: Negative for dysuria.  Musculoskeletal: Positive for joint swelling.  Skin: Negative for rash.  Neurological: Positive for headaches.  Hematological: Does not bruise/bleed easily.  Psychiatric/Behavioral: Positive for dysphoric mood. The patient is nervous/anxious.        Objective:   Physical Exam        Assessment & Plan:

## 2012-05-08 LAB — COMPREHENSIVE METABOLIC PANEL
ALT: 25 U/L (ref 0–35)
Albumin: 3.4 g/dL — ABNORMAL LOW (ref 3.5–5.2)
Alkaline Phosphatase: 70 U/L (ref 39–117)
CO2: 28 mEq/L (ref 19–32)
GFR: 63.04 mL/min (ref >60.00)
Glucose, Bld: 79 mg/dL (ref 70–99)
Potassium: 4 mEq/L (ref 3.5–5.1)
Sodium: 138 mEq/L (ref 135–145)
Total Protein: 6.8 g/dL (ref 6.0–8.3)

## 2012-05-08 LAB — BRAIN NATRIURETIC PEPTIDE: Pro B Natriuretic peptide (BNP): 44 pg/mL (ref 0.0–100.0)

## 2012-05-10 ENCOUNTER — Telehealth: Payer: Self-pay | Admitting: Pulmonary Disease

## 2012-05-10 DIAGNOSIS — G4733 Obstructive sleep apnea (adult) (pediatric): Secondary | ICD-10-CM | POA: Insufficient documentation

## 2012-05-10 NOTE — Telephone Encounter (Signed)
lmomtcb x1 

## 2012-05-10 NOTE — Telephone Encounter (Signed)
Dg Chest 2 View  05/07/2012  *RADIOLOGY REPORT*  Clinical Data: Chronic shortness of breath, diabetes  CHEST - 2 VIEW  Comparison: Chest x-ray of 08/04/2011  Findings: The lungs are not well aerated.  No infiltrate or effusion is seen.  Mild cardiomegaly is stable.  No bony abnormality is seen.  IMPRESSION: Suboptimal inspiration.  Stable cardiomegaly.  No active lung disease.  Original Report Authenticated By: Juline Patch, M.D.    CBC    Component Value Date/Time   WBC 11.2* 05/07/2012 1705   RBC 4.36 05/07/2012 1705   HGB 12.1 05/07/2012 1705   HCT 38.1 05/07/2012 1705   PLT 316.0 05/07/2012 1705   MCV 87.4 05/07/2012 1705   MCH 28.3 07/05/2011 1615   MCHC 31.8 05/07/2012 1705   RDW 16.0* 05/07/2012 1705   LYMPHSABS 3.7 05/07/2012 1705   MONOABS 1.3* 05/07/2012 1705   EOSABS 0.0 05/07/2012 1705   BASOSABS 0.0 05/07/2012 1705    BMET    Component Value Date/Time   NA 138 05/07/2012 1705   K 4.0 05/07/2012 1705   CL 104 05/07/2012 1705   CO2 28 05/07/2012 1705   GLUCOSE 79 05/07/2012 1705   BUN 17 05/07/2012 1705   CREATININE 0.9 05/07/2012 1705   CALCIUM 8.6 05/07/2012 1705   GFRNONAA >60 07/05/2011 1615   GFRAA >60 07/05/2011 1615    Lab Results  Component Value Date   ALT 25 05/07/2012   AST 24 05/07/2012   ALKPHOS 70 05/07/2012   BILITOT 0.5 05/07/2012   BNP    Component Value Date/Time   PROBNP 44.0 05/07/2012 1705     Will have my nurse inform patient that labs and chest xray okay.  Will call back after reviewing PFT.  No change to current plan.

## 2012-05-10 NOTE — Assessment & Plan Note (Addendum)
She has long history of asthma.  She is now oxygen dependent after episode of pneumonia.  She has significant second hand tobacco exposure.  She is morbidly obese, and likely has a component of deconditioning.  She does have grade 1 diastolic dysfunction on echo from October 2012.  To further assess will repeat chest xray today, and arrange for pulmonary function testing.  Will also request records from her PCP and allergist.  Depending on results will determine what additional testing is needed.  She can continue dulera and nebulizer therapy, and oxygen for now.

## 2012-05-13 NOTE — Telephone Encounter (Signed)
Pt aware  labs and cxr  Are okay per dr Craige Cotta, also VS will call with pft results once reviewed--pt verbalized understanding

## 2012-05-13 NOTE — Telephone Encounter (Signed)
Pt called back.  Call her back @ [phone] or [phone] Alexis Rios

## 2012-05-13 NOTE — Telephone Encounter (Signed)
Pt called back Alexis Rios  

## 2012-05-14 ENCOUNTER — Encounter: Payer: Self-pay | Admitting: Pharmacist

## 2012-05-14 ENCOUNTER — Ambulatory Visit (INDEPENDENT_AMBULATORY_CARE_PROVIDER_SITE_OTHER): Payer: Medicare Other | Admitting: Pharmacist

## 2012-05-14 VITALS — Wt 253.0 lb

## 2012-05-14 DIAGNOSIS — E785 Hyperlipidemia, unspecified: Secondary | ICD-10-CM

## 2012-05-14 NOTE — Patient Instructions (Addendum)
Continue to maintain a balanced diet with lots of fresh vegetables.    Continue to limit the amount of potatoes you eat.  Stay as active as possible; continue walking as much as you can tolerate.  Start taking fenofibrate (Trilipix) by mouth once-daily with food.  If you do not have any problems, please call [facility] Rounds at [phone] for a prescription.  Next visit: 2 months.  Make sure you have your labwork done before your visit.

## 2012-05-14 NOTE — Assessment & Plan Note (Addendum)
CV Risk Assessment: Risk Factors: DM, age Positive Factors: High HDL  TC goa l<200, TG goal <150, HDL goal >45, LDL goal <100 (<70 optimal)  Since her last visit, TG and HDL have improved, but her LDL has increased from 99 to 143.  This is likely due to discontinuing her statin therapy.  Given lack of physical activity and slowed dietary motivation, she will likely need an alternative drug therapy.  Plan: -Continue to maintain a balanced diet with lots of fresh vegetables.   -Continue to limit the amount of potatoes you eat. -Stay as active as possible; continue walking as much as you can tolerate. -Start taking fenofibrate (Trilipix) by mouth once-daily with food.  If you do not have any problems, please call [facility]on Rounds at [phone] for a prescription. -Next visit in 2 months.  Make sure you have your labwork done before your visit.

## 2012-05-14 NOTE — Progress Notes (Signed)
HPI: Alexis Rios presented today in good spirits with her husband for a 3 month follow up of her lipids.  She is currently not taking any cholesterol medication after developing insomnia and cramping all over her body after starting pravastatin 20mg  in February.  She was switched from Crestor due to concerns over cost.  Of note, she reportedly took OTC niacin in the past, but discontinued it due to flushing.  Diet: She and her husband have switched from white to whole wheat bread, and she continues to enjoy lots of vegetables.  They have been grilling a lot more recently (ie. Chicken, pork chops, burgers, hot dogs).  Exercise: She currently is not able to exercise much due to significant shortness of breath on exertion.  However, she reports trying to be more active with walking around the house and to the mailbox each day.  Lipid Panel     Component Value Date/Time   CHOL 223* 05/07/2012 1200   TRIG 91 05/07/2012 1200   HDL 62 05/07/2012 1200   CHOLHDL 3.6 05/07/2012 1200   VLDL 18 05/07/2012 1200   LDLCALC 143* 05/07/2012 1200    Current Outpatient Prescriptions  Medication Sig Dispense Refill  . albuterol (PROVENTIL HFA;VENTOLIN HFA) 108 (90 BASE) MCG/ACT inhaler Inhale 2 puffs into the lungs every 6 (six) hours as needed. For shortness of breath       . albuterol (PROVENTIL) (2.5 MG/3ML) 0.083% nebulizer solution Take 2.5 mg by nebulization every 6 (six) hours as needed.        . ALPRAZolam (XANAX) 0.5 MG tablet Take 0.5 mg by mouth 2 (two) times daily as needed.       Marland Kitchen azelastine (ASTELIN) 137 MCG/SPRAY nasal spray Place 1 spray into both nostrils Twice daily.      . citalopram (CELEXA) 40 MG tablet Take 40 mg by mouth daily.        . cyclobenzaprine (FLEXERIL) 10 MG tablet Take 10 mg by mouth 3 (three) times daily as needed. Muscle spasms        . diclofenac (VOLTAREN) 75 MG EC tablet Take 75 mg by mouth 2 (two) times daily.        Elwin Sleight 200-5 MCG/ACT AERO Take 2 puffs by mouth 2  (two) times daily.       . fexofenadine (ALLEGRA) 180 MG tablet Take 180 mg by mouth daily.      Marland Kitchen gabapentin (NEURONTIN) 300 MG capsule Take 300 mg by mouth 3 (three) times daily.        Marland Kitchen HYDROcodone-acetaminophen (NORCO) 7.5-325 MG per tablet Take 1-2 tablets by mouth. Every 6 hours as needed for pain      . ipratropium (ATROVENT) 0.02 % nebulizer solution Take 500 mcg by nebulization 4 (four) times daily as needed. For shortness of breath       . metFORMIN (GLUCOPHAGE-XR) 500 MG 24 hr tablet Take 2 tablets by mouth Twice daily.      Marland Kitchen omeprazole (PRILOSEC) 20 MG capsule Take 1 tablet by mouth Twice daily.      Marland Kitchen topiramate (TOPAMAX) 25 MG capsule Take 25 mg by mouth at bedtime.        . traMADol-acetaminophen (ULTRACET) 37.5-325 MG per tablet Take 1 tablet by mouth every 6 (six) hours as needed. pain

## 2012-05-21 ENCOUNTER — Telehealth: Payer: Self-pay | Admitting: Pharmacist

## 2012-05-21 NOTE — Telephone Encounter (Signed)
Pt called to report having muscle pains and cramps in her legs approximately 2 days after starting Trilipix.  She has stopped it and the pains have improved.  Will not take any further Trilipix at this time.  Discussed options with pt.  Unfortunately we have tried most of the generic cholesterol medications will little success.  She is going to call her insurance company to determine the cost of 3 day/ week Crestor and Zetia.  If these will work for her budget, she will call and we can consider these options.

## 2012-05-28 ENCOUNTER — Ambulatory Visit (INDEPENDENT_AMBULATORY_CARE_PROVIDER_SITE_OTHER): Payer: Medicare Other | Admitting: Pulmonary Disease

## 2012-05-28 ENCOUNTER — Encounter: Payer: Self-pay | Admitting: Pulmonary Disease

## 2012-05-28 VITALS — BP 156/68 | HR 83 | Temp 97.5°F | Ht 60.0 in | Wt 259.0 lb

## 2012-05-28 DIAGNOSIS — J45909 Unspecified asthma, uncomplicated: Secondary | ICD-10-CM | POA: Insufficient documentation

## 2012-05-28 DIAGNOSIS — R0609 Other forms of dyspnea: Secondary | ICD-10-CM

## 2012-05-28 DIAGNOSIS — R0600 Dyspnea, unspecified: Secondary | ICD-10-CM

## 2012-05-28 DIAGNOSIS — I519 Heart disease, unspecified: Secondary | ICD-10-CM

## 2012-05-28 DIAGNOSIS — I5189 Other ill-defined heart diseases: Secondary | ICD-10-CM | POA: Insufficient documentation

## 2012-05-28 LAB — PULMONARY FUNCTION TEST

## 2012-05-28 NOTE — Patient Instructions (Signed)
Will arrange for cardio-pulmonary exercise test Follow up in 8 weeks to review test

## 2012-05-28 NOTE — Progress Notes (Signed)
Chief Complaint  Patient presents with  . Follow-up    w/ PFT. Pt states she has a good/ bad days with her breathing. Has a cough, wheezing, chest tx when she has a hard time breahting    History of Present Illness: Alexis Rios is a 65 y.o. female never smoker with dyspnea.  She is here to review her PFT results.  She continues to have trouble with her breathing with exertion.   Past Medical History  Diagnosis Date  . Diabetes mellitus   . Asthma   . Chronic back pain   . Hypercholesteremia   . Cancer   . Seasonal allergies   . Skin cancer     scc/bcc  . Chronic headaches   . Sleep apnea     Past Surgical History  Procedure Date  . Cholecystectomy   . Tonsillectomy   . Shoulder surg     x2 R  . Back surgery     x3  . Knee surgery     R  . Foot surgery     R  . Wrist surgery     L  . Appendectomy   . Abdominal hysterectomy     partial  . Breast surgery     lump x 2 R    Outpatient Encounter Prescriptions as of 05/28/2012  Medication Sig Dispense Refill  . albuterol (PROVENTIL HFA;VENTOLIN HFA) 108 (90 BASE) MCG/ACT inhaler Inhale 2 puffs into the lungs every 6 (six) hours as needed. For shortness of breath       . albuterol (PROVENTIL) (2.5 MG/3ML) 0.083% nebulizer solution Take 2.5 mg by nebulization every 6 (six) hours as needed.        . ALPRAZolam (XANAX) 0.5 MG tablet Take 0.5 mg by mouth 2 (two) times daily as needed.       Marland Kitchen azelastine (ASTELIN) 137 MCG/SPRAY nasal spray Place 1 spray into both nostrils Twice daily.      . citalopram (CELEXA) 40 MG tablet Take 40 mg by mouth daily.        . cyclobenzaprine (FLEXERIL) 10 MG tablet Take 10 mg by mouth 3 (three) times daily as needed. Muscle spasms        . diclofenac (VOLTAREN) 75 MG EC tablet Take 75 mg by mouth 2 (two) times daily.        Elwin Sleight 200-5 MCG/ACT AERO Take 2 puffs by mouth 2 (two) times daily.       . fexofenadine (ALLEGRA) 180 MG tablet Take 180 mg by mouth daily.      Marland Kitchen gabapentin  (NEURONTIN) 300 MG capsule Take 300 mg by mouth 3 (three) times daily.        Marland Kitchen HYDROcodone-acetaminophen (NORCO) 7.5-325 MG per tablet Take 1-2 tablets by mouth. Every 6 hours as needed for pain      . ipratropium (ATROVENT) 0.02 % nebulizer solution Take 500 mcg by nebulization 4 (four) times daily as needed. For shortness of breath       . metFORMIN (GLUCOPHAGE-XR) 500 MG 24 hr tablet Take 2 tablets by mouth Twice daily.      Marland Kitchen omeprazole (PRILOSEC) 20 MG capsule Take 1 tablet by mouth Twice daily.      Marland Kitchen topiramate (TOPAMAX) 25 MG capsule Take 25 mg by mouth at bedtime.        . traMADol-acetaminophen (ULTRACET) 37.5-325 MG per tablet Take 1 tablet by mouth every 6 (six) hours as needed. pain  Allergies  Allergen Reactions  . Codeine Itching  . Cymbalta (Duloxetine Hcl) Other (See Comments)    Cannot urinate  . Keflet (Cephalexin Monohydrate) Itching  . Sulfa Antibiotics Swelling  . Tape Rash    Physical Exam:  Blood pressure 156/68, pulse 83, temperature 97.5 F (36.4 C), temperature source Oral, height 5' (1.524 m), weight 259 lb (117.482 kg), SpO2 98.00%. Body mass index is 50.58 kg/(m^2). Wt Readings from Last 2 Encounters:  05/28/12 259 lb (117.482 kg)  05/14/12 253 lb (114.76 kg)    General - Obese  HEENT - PERRLA, EOMI, no sinus tenderness, narrow nasal angles, MP 3, no oral exudate, no LAN  Cardiac - s1s2 regular, no murmur  Chest - decreased respiratory excursion, no wheeze/rales/dullness  Abdomen - obese, soft, non tender  Extremities - minimal ankle edema  Neurologic - normal strength, CN intact  Skin - no rashes  Psychiatric - normal mood, behavior   PFT 05/28/12 >> FEV1 2.00 (115%), FEV1% 82, TLC 4.32 (107%), DLCO 62%, no BD.  Normal spirometry, no bronchodilator response, normal lung volumes, moderate diffusion defect corrects for lung volumes (likely related to obesity).  Dg Chest 2 View  05/07/2012  *RADIOLOGY REPORT*  Clinical Data: Chronic  shortness of breath, diabetes  CHEST - 2 VIEW  Comparison: Chest x-ray of 08/04/2011  Findings: The lungs are not well aerated.  No infiltrate or effusion is seen.  Mild cardiomegaly is stable.  No bony abnormality is seen.  IMPRESSION: Suboptimal inspiration.  Stable cardiomegaly.  No active lung disease.  Original Report Authenticated By: Juline Patch, M.D.    Lab Results  Component Value Date   WBC 11.2* 05/07/2012   HGB 12.1 05/07/2012   HCT 38.1 05/07/2012   MCV 87.4 05/07/2012   PLT 316.0 05/07/2012   Lab Results  Component Value Date   CREATININE 0.9 05/07/2012   BUN 17 05/07/2012   NA 138 05/07/2012   K 4.0 05/07/2012   CL 104 05/07/2012   CO2 28 05/07/2012   Lab Results  Component Value Date   ALT 25 05/07/2012   AST 24 05/07/2012   ALKPHOS 70 05/07/2012   BILITOT 0.5 05/07/2012   BNP    Component Value Date/Time   PROBNP 44.0 05/07/2012 1705      Assessment/Plan:  Coralyn Helling, MD [facility] Pager:  [phone] 05/28/2012, 10:02 AM

## 2012-05-28 NOTE — Assessment & Plan Note (Signed)
She is followed by Dr. Lucie Leather for this.  She is to continue her inhaler regimen.

## 2012-05-28 NOTE — Assessment & Plan Note (Signed)
She has persistent dyspnea after episode of pneumonia one year ago.  Her evaluation so far has been relatively unrevealing.  It is likely most of her symptoms are related to deconditioning.  To further assess will arrange for cardiopulmonary stress testing.

## 2012-05-28 NOTE — Progress Notes (Signed)
PFT done today. 

## 2012-05-28 NOTE — Assessment & Plan Note (Signed)
She had grade 1 diastolic dysfunction from Echo in October 2012.  Defer management of this to primary care and cardiology.

## 2012-06-03 ENCOUNTER — Other Ambulatory Visit (HOSPITAL_COMMUNITY): Payer: Self-pay

## 2012-06-03 ENCOUNTER — Ambulatory Visit (HOSPITAL_COMMUNITY): Payer: Medicare Other | Attending: Pulmonary Disease

## 2012-06-03 ENCOUNTER — Telehealth: Payer: Self-pay | Admitting: Pharmacist

## 2012-06-03 DIAGNOSIS — R0602 Shortness of breath: Secondary | ICD-10-CM | POA: Insufficient documentation

## 2012-06-03 DIAGNOSIS — R0600 Dyspnea, unspecified: Secondary | ICD-10-CM

## 2012-06-04 DIAGNOSIS — M259 Joint disorder, unspecified: Secondary | ICD-10-CM | POA: Insufficient documentation

## 2012-06-20 DIAGNOSIS — R0602 Shortness of breath: Secondary | ICD-10-CM

## 2012-06-24 ENCOUNTER — Ambulatory Visit: Payer: Medicare Other | Admitting: Physician Assistant

## 2012-06-25 ENCOUNTER — Ambulatory Visit (INDEPENDENT_AMBULATORY_CARE_PROVIDER_SITE_OTHER): Payer: Medicare Other | Admitting: Physician Assistant

## 2012-06-25 ENCOUNTER — Telehealth: Payer: Self-pay | Admitting: Pulmonary Disease

## 2012-06-25 ENCOUNTER — Encounter: Payer: Self-pay | Admitting: Physician Assistant

## 2012-06-25 VITALS — BP 144/72 | HR 76 | Ht 60.0 in | Wt 255.0 lb

## 2012-06-25 DIAGNOSIS — R0600 Dyspnea, unspecified: Secondary | ICD-10-CM

## 2012-06-25 DIAGNOSIS — R0602 Shortness of breath: Secondary | ICD-10-CM

## 2012-06-25 DIAGNOSIS — I5189 Other ill-defined heart diseases: Secondary | ICD-10-CM

## 2012-06-25 DIAGNOSIS — E669 Obesity, unspecified: Secondary | ICD-10-CM

## 2012-06-25 DIAGNOSIS — E785 Hyperlipidemia, unspecified: Secondary | ICD-10-CM

## 2012-06-25 DIAGNOSIS — I519 Heart disease, unspecified: Secondary | ICD-10-CM

## 2012-06-25 NOTE — Telephone Encounter (Signed)
CPST 06/03/12>>deconditioning.  Left message on pt's voicemail.  Advised her to call back with questions, otherwise will discuss in more detail at next ROV.  No change to current treatment plan.

## 2012-06-25 NOTE — Patient Instructions (Addendum)
Your physician recommends that you return for lab work in: TODAY BMET, BNP   Your physician wants you to follow-up in: 6 MONTHS WITH DR. Tenny Craw. You will receive a reminder letter in the mail two months in advance. If you don't receive a letter, please call our office to schedule the follow-up appointment.

## 2012-06-25 NOTE — Progress Notes (Signed)
[address] [place], [place]  [address] Phone: [phone] Fax:  [phone]  Date:  06/25/2012   Name:  Alexis Rios   DOB:  1946/03/08   MRN:  [id number]  PCP:  Alan Mulder, MD  Primary Cardiologist:  Dr. Dietrich Pates  Primary Electrophysiologist:  None    History of Present Illness: Alexis Rios is a 66 y.o. female who returns for follow up.  She has a history of DM2, HTN, HL, obesity, asthma, chronic pain.  LHC 03/2008: Mid AV groove circumflex 30%, mid RCA 20%, EF 60%.  She established with Dr. Tenny Craw 08/2011.  She was complaining of chest pain at that time.  Stress test and echocardiogram was arranged.  Echo 09/2011: Mild LVH, EF 60-65%, grade 1 diastolic dysfunction, trivial AI, mild MR, mild LAE.  Dobutamine Myoview 09/2011: No scar or ischemia, no EKG changes, study not gated.  Cardiopulmonary stress test exercise 2013: Normal functional capacity when compared to matched sedentary Norins; obesity and deconditioning primary factors contributing to overall limitation; ?OHS.    She comes in today to discuss her echocardiogram.  She is quite frustrated about her dyspnea.  She has been followed by pulmonology.  Her dyspnea with exertion dates back one and a half years ago.  She was diagnosed with pneumonia.  She has had significant dyspnea with exertion since.  She describes class III symptoms.  She denies chest pain.  She denies syncope.  She sleeps on to 3 pillows.  She sleeps with CPAP.  She does have some pedal edema.  She was told that the blood does not flow through her heart correctly.  I suspect she is referring to her diastolic dysfxn.    Wt Readings from Last 3 Encounters:  06/25/12 255 lb (115.667 kg)  05/28/12 259 lb (117.482 kg)  05/14/12 253 lb (114.76 kg)     Potassium  Date/Time Value Range Status  05/07/2012  5:05 PM 4.0  3.5 - 5.1 mEq/L Final     Creatinine, Ser  Date/Time Value Range Status  05/07/2012  5:05 PM 0.9  0.4 - 1.2  mg/dL Final     Hemoglobin  Date/Time Value Range Status  05/07/2012  5:05 PM 12.1  12.0 - 15.0 g/dL Final    Past Medical History  Diagnosis Date  . Diabetes mellitus   . Asthma   . Chronic back pain   . Hypercholesteremia   . Cancer   . Seasonal allergies   . Skin cancer     scc/bcc  . Chronic headaches   . Sleep apnea   . S/P cardiac catheterization     LHC 03/2008: Mid AV groove circumflex 30%, mid RCA 20%, EF 60%  . H/O echocardiogram     Echo 09/2011: Mild LVH, EF 60-65%, grade 1 diastolic dysfunction, trivial AI, mild MR, mild LAE.  . H/O exercise stress test     Dobutamine Myoview 09/2011: No scar or ischemia, no EKG changes, study not gated.    Current Outpatient Prescriptions  Medication Sig Dispense Refill  . albuterol (PROVENTIL HFA;VENTOLIN HFA) 108 (90 BASE) MCG/ACT inhaler Inhale 2 puffs into the lungs every 6 (six) hours as needed. For shortness of breath       . albuterol (PROVENTIL) (2.5 MG/3ML) 0.083% nebulizer solution Take 2.5 mg by nebulization every 6 (six) hours as needed.        . ALPRAZolam (XANAX) 0.5 MG tablet Take 0.5 mg by mouth 2 (two) times daily as needed.       Marland Kitchen  azelastine (ASTELIN) 137 MCG/SPRAY nasal spray Place 1 spray into both nostrils Twice daily.      . citalopram (CELEXA) 40 MG tablet Take 40 mg by mouth daily.        . diclofenac (VOLTAREN) 75 MG EC tablet Take 75 mg by mouth daily.       Elwin Sleight 200-5 MCG/ACT AERO Take 2 puffs by mouth 2 (two) times daily.       . fentaNYL (DURAGESIC - DOSED MCG/HR) 25 MCG/HR Place 1 patch onto the skin every 3 (three) days.       . fexofenadine (ALLEGRA) 180 MG tablet Take 180 mg by mouth daily.      . fluticasone (FLONASE) 50 MCG/ACT nasal spray Place 2 sprays into the nose daily.      Marland Kitchen gabapentin (NEURONTIN) 300 MG capsule Take 300 mg by mouth 3 (three) times daily.        Marland Kitchen HYDROcodone-acetaminophen (NORCO) 7.5-325 MG per tablet Take 1-2 tablets by mouth. Every 6 hours as needed for pain        . ipratropium (ATROVENT) 0.02 % nebulizer solution Take 500 mcg by nebulization 4 (four) times daily as needed. For shortness of breath       . metFORMIN (GLUCOPHAGE-XR) 500 MG 24 hr tablet Take 2 tablets by mouth Twice daily.      . methocarbamol (ROBAXIN) 750 MG tablet Take 750 mg by mouth 3 (three) times daily.      Marland Kitchen omeprazole (PRILOSEC) 20 MG capsule Take 1 tablet by mouth Twice daily.      . ondansetron (ZOFRAN-ODT) 4 MG disintegrating tablet Take 4 mg by mouth. EVERY 4-6 HOURS AS NEEDED      . topiramate (TOPAMAX) 25 MG capsule Take 25 mg by mouth at bedtime.          Allergies: Allergies  Allergen Reactions  . Codeine Itching  . Cymbalta (Duloxetine Hcl) Other (See Comments)    Cannot urinate  . Keflet (Cephalexin Monohydrate) Itching  . Sulfa Antibiotics Swelling  . Tape Rash    History  Substance Use Topics  . Smoking status: Never Smoker   . Smokeless tobacco: Never Used  . Alcohol Use: No     ROS:  Please see the history of present illness.   + diarrhea.   All other systems reviewed and negative.   PHYSICAL EXAM: VS:  BP 144/72  Pulse 76  Ht 5' (1.524 m)  Wt 255 lb (115.667 kg)  BMI 49.80 kg/m2 Well nourished, well developed, in no acute distress HEENT: normal Neck: no JVD Cardiac:  normal S1, S2; RRR; no murmur Lungs:  clear to auscultation bilaterally, no wheezing, rhonchi or rales Abd: soft, nontender, no hepatomegaly Ext: trace bilateral LE edema Skin: warm and dry Neuro:  CNs 2-12 intact, no focal abnormalities noted  EKG:  Sinus rhythm, heart rate 68, left axis deviation, inferior Q waves, poor R wave progression, no change from prior tracing      ASSESSMENT AND PLAN:  1.  Dyspnea This is likely multifactorial.  She had a recent cardiopulmonary stress test that was more consistent with deconditioning and possibly OHS. She does have diastolic dysfunction. Her weights have been fairly stable. On exam she does not appear to be that significantly  volume overloaded. I will check a basic metabolic panel and a BNP.  If her BNP is abnormal, I will place her on a low dose of Lasix to see if this helps her symptoms. Overall, I think  obesity is a huge issue driving her symptoms.  I did warn her that if we do start Lasix that there would not likely be a dramatic improvement in her symptoms. She can follow up with Dr. Tenny Craw in 6 months or sooner as needed.  2.  Obesity We discussed different strategies for weight loss.  I encouraged her to look into Weight Watchers.  I also encouraged her to gradually increase her activity as much as possible.  We discussed pool exercises.  3.  Hyperlipidemia Followed by the lipid clinic.  Signed, Tereso Newcomer, PA-C  2:35 PM 06/25/2012

## 2012-06-26 LAB — BASIC METABOLIC PANEL
GFR: 67.12 mL/min (ref >60.00)
Glucose, Bld: 98 mg/dL (ref 70–99)
Potassium: 4 mEq/L (ref 3.5–5.1)
Sodium: 138 mEq/L (ref 135–145)

## 2012-06-26 LAB — BRAIN NATRIURETIC PEPTIDE: Pro B Natriuretic peptide (BNP): 92 pg/mL (ref 0.0–100.0)

## 2012-06-27 ENCOUNTER — Telehealth: Payer: Self-pay | Admitting: *Deleted

## 2012-06-27 NOTE — Telephone Encounter (Signed)
lmom labs ok 

## 2012-06-27 NOTE — Telephone Encounter (Signed)
Message copied by Tarri Fuller on Thu Jun 27, 2012 10:50 AM ------      Message from: [place], [place] T      Created: Wed Jun 26, 2012  8:57 PM       Please notify patient that the lab results are ok.      Tereso Newcomer, PA-C  8:57 PM 06/26/2012

## 2012-07-03 ENCOUNTER — Encounter (HOSPITAL_COMMUNITY): Payer: Self-pay | Admitting: *Deleted

## 2012-07-03 ENCOUNTER — Emergency Department (HOSPITAL_COMMUNITY)
Admission: EM | Admit: 2012-07-03 | Discharge: 2012-07-04 | Disposition: A | Discharge status: Home / Self Care | Payer: Medicare Other | Attending: Emergency Medicine | Admitting: Emergency Medicine

## 2012-07-03 ENCOUNTER — Emergency Department (HOSPITAL_COMMUNITY): Payer: Medicare Other

## 2012-07-03 DIAGNOSIS — X58XXXA Exposure to other specified factors, initial encounter: Secondary | ICD-10-CM | POA: Insufficient documentation

## 2012-07-03 DIAGNOSIS — J45909 Unspecified asthma, uncomplicated: Secondary | ICD-10-CM | POA: Insufficient documentation

## 2012-07-03 DIAGNOSIS — Z9109 Other allergy status, other than to drugs and biological substances: Secondary | ICD-10-CM | POA: Insufficient documentation

## 2012-07-03 DIAGNOSIS — E7800 Pure hypercholesterolemia, unspecified: Secondary | ICD-10-CM | POA: Insufficient documentation

## 2012-07-03 DIAGNOSIS — G8929 Other chronic pain: Secondary | ICD-10-CM | POA: Insufficient documentation

## 2012-07-03 DIAGNOSIS — S93409A Sprain of unspecified ligament of unspecified ankle, initial encounter: Secondary | ICD-10-CM | POA: Insufficient documentation

## 2012-07-03 DIAGNOSIS — E1142 Type 2 diabetes mellitus with diabetic polyneuropathy: Secondary | ICD-10-CM | POA: Insufficient documentation

## 2012-07-03 DIAGNOSIS — Z9089 Acquired absence of other organs: Secondary | ICD-10-CM | POA: Insufficient documentation

## 2012-07-03 DIAGNOSIS — Z85828 Personal history of other malignant neoplasm of skin: Secondary | ICD-10-CM | POA: Insufficient documentation

## 2012-07-03 DIAGNOSIS — Y92009 Unspecified place in unspecified non-institutional (private) residence as the place of occurrence of the external cause: Secondary | ICD-10-CM | POA: Insufficient documentation

## 2012-07-03 DIAGNOSIS — E1140 Type 2 diabetes mellitus with diabetic neuropathy, unspecified: Secondary | ICD-10-CM

## 2012-07-03 MED ORDER — HYDROMORPHONE HCL PF 1 MG/ML IJ SOLN
1.0000 mg | Freq: Once | INTRAMUSCULAR | Status: AC
  Administered 2012-07-03: 1 mg via INTRAMUSCULAR
  Filled 2012-07-03: qty 1

## 2012-07-03 MED ORDER — ONDANSETRON HCL 4 MG PO TABS
4.0000 mg | ORAL_TABLET | Freq: Once | ORAL | Status: AC
  Administered 2012-07-03: 4 mg via ORAL
  Filled 2012-07-03: qty 1

## 2012-07-03 NOTE — ED Notes (Signed)
Has been having pain in rt ankle, but today also has swelling.  Has 2 small areas that look like healing scabbed areas.  No injury

## 2012-07-04 NOTE — ED Provider Notes (Signed)
Medical screening examination/treatment/procedure(s) were performed by non-physician practitioner and as supervising physician I was immediately available for consultation/collaboration.  Nicoletta Dress. Colon Branch, MD 07/04/12 [phone]

## 2012-07-04 NOTE — ED Provider Notes (Signed)
History     CSN: [id number]  Arrival date & time 07/03/12  2210   First MD Initiated Contact with Patient 07/03/12 2234      Chief Complaint  Patient presents with  . Ankle Pain    (Consider location/radiation/quality/duration/timing/severity/associated sxs/prior treatment) Patient is a 66 y.o. female presenting with ankle pain. The history is provided by the patient.  Ankle Pain  The incident occurred yesterday. The incident occurred at home. There was no injury mechanism. The pain is present in the right ankle. The quality of the pain is described as aching. The pain is moderate. The pain has been fluctuating since onset. Associated symptoms include numbness and loss of sensation. Associated symptoms comments: Hx of neuropathy with partial loss of sensation.. She reports no foreign bodies present. The symptoms are aggravated by bearing weight. She has tried NSAIDs for the symptoms. The treatment provided no relief.    Past Medical History  Diagnosis Date  . Diabetes mellitus   . Asthma   . Chronic back pain   . Hypercholesteremia   . Seasonal allergies   . Chronic headaches   . Sleep apnea   . S/P cardiac catheterization     LHC 03/2008: Mid AV groove circumflex 30%, mid RCA 20%, EF 60%  . H/O echocardiogram     Echo 09/2011: Mild LVH, EF 60-65%, grade 1 diastolic dysfunction, trivial AI, mild MR, mild LAE.  . H/O exercise stress test     Dobutamine Myoview 09/2011: No scar or ischemia, no EKG changes, study not gated.  . Cancer   . Skin cancer     scc/bcc    Past Surgical History  Procedure Date  . Cholecystectomy   . Tonsillectomy   . Shoulder surg     x2 R  . Back surgery     x3  . Knee surgery     R  . Foot surgery     R  . Wrist surgery     L  . Appendectomy   . Abdominal hysterectomy     partial  . Breast surgery     lump x 2 R    Family History  Problem Relation Age of Onset  . Allergies Daughter   . Cancer Brother   . Cancer Brother    leukemia  . Emphysema Brother     History  Substance Use Topics  . Smoking status: Never Smoker   . Smokeless tobacco: Never Used  . Alcohol Use: No    OB History    Grav Para Term Preterm Abortions TAB SAB Ect Mult Living                  Review of Systems  Constitutional: Negative for activity change.       All ROS Neg except as noted in HPI  HENT: Positive for sneezing. Negative for nosebleeds and neck pain.   Eyes: Negative for photophobia and discharge.  Respiratory: Positive for wheezing. Negative for cough and shortness of breath.   Cardiovascular: Negative for chest pain and palpitations.  Gastrointestinal: Negative for abdominal pain and blood in stool.  Genitourinary: Negative for dysuria, frequency and hematuria.  Musculoskeletal: Positive for back pain. Negative for arthralgias.  Skin: Negative.   Neurological: Positive for numbness. Negative for dizziness, seizures and speech difficulty.  Psychiatric/Behavioral: Negative for hallucinations and confusion.    Allergies  Codeine; Cymbalta; Keflet; Sulfa antibiotics; and Tape  Home Medications   Current Outpatient Rx  Name Route Sig Dispense Refill  .  ALBUTEROL SULFATE HFA 108 (90 BASE) MCG/ACT IN AERS Inhalation Inhale 2 puffs into the lungs every 6 (six) hours as needed. For shortness of breath     . ALBUTEROL SULFATE (2.5 MG/3ML) 0.083% IN NEBU Nebulization Take 2.5 mg by nebulization every 6 (six) hours as needed.      . ALPRAZOLAM 0.5 MG PO TABS Oral Take 0.5 mg by mouth 2 (two) times daily as needed.     . AZELASTINE HCL 137 MCG/SPRAY NA SOLN Each Nare Place 1 spray into both nostrils Twice daily.    Marland Kitchen CITALOPRAM HYDROBROMIDE 40 MG PO TABS Oral Take 40 mg by mouth daily.      Marland Kitchen DICLOFENAC SODIUM 75 MG PO TBEC Oral Take 75 mg by mouth daily.     Elwin Sleight 200-5 MCG/ACT IN AERO Oral Take 2 puffs by mouth 2 (two) times daily.     . FENTANYL 25 MCG/HR TD PT72 Transdermal Place 1 patch onto the skin every 3  (three) days.     Marland Kitchen FEXOFENADINE HCL 180 MG PO TABS Oral Take 180 mg by mouth every morning.     Marland Kitchen FLUTICASONE PROPIONATE 50 MCG/ACT NA SUSP Nasal Place 2 sprays into the nose daily as needed.     Marland Kitchen GABAPENTIN 300 MG PO CAPS Oral Take 300 mg by mouth 3 (three) times daily.      Marland Kitchen HYDROCODONE-ACETAMINOPHEN 7.5-325 MG PO TABS Oral Take 1-2 tablets by mouth. Every 6 hours as needed for pain    . IPRATROPIUM BROMIDE 0.02 % IN SOLN Nebulization Take 500 mcg by nebulization 4 (four) times daily as needed. For shortness of breath     . METFORMIN HCL ER 500 MG PO TB24 Oral Take 2 tablets by mouth Twice daily.    Marland Kitchen METHOCARBAMOL 750 MG PO TABS Oral Take 750 mg by mouth 3 (three) times daily.    Marland Kitchen OMEPRAZOLE 20 MG PO CPDR Oral Take 1 tablet by mouth Twice daily.    Marland Kitchen ONDANSETRON 4 MG PO TBDP Oral Take 4 mg by mouth. EVERY 4-6 HOURS AS NEEDED    . TOPIRAMATE 25 MG PO CPSP Oral Take 25 mg by mouth at bedtime.        BP 134/44  Pulse 69  Temp 98.3 F (36.8 C) (Oral)  Resp 20  Ht 5' (1.524 m)  Wt 247 lb (112.038 kg)  BMI 48.24 kg/m2  SpO2 95%  Physical Exam  Nursing note and vitals reviewed. Constitutional: She is oriented to person, place, and time. She appears well-developed and well-nourished.  Non-toxic appearance.  HENT:  Head: Normocephalic.  Right Ear: Tympanic membrane and external ear normal.  Left Ear: Tympanic membrane and external ear normal.  Eyes: EOM and lids are normal. Pupils are equal, round, and reactive to light.  Neck: Normal range of motion. Neck supple. Carotid bruit is not present.  Cardiovascular: Normal rate, regular rhythm, normal heart sounds, intact distal pulses and normal pulses.   Pulmonary/Chest: Breath sounds normal. No respiratory distress.       Rhonchi with few scattered wheezes  Abdominal: Soft. Bowel sounds are normal. There is no tenderness. There is no guarding.  Musculoskeletal: Normal range of motion.       The second toe of the right foot has been  surgically removed. There is good capillary refill of the toes. The dorsalis pedis pulse is 2+. There are 2 small red marks on the dorsum of the right foot. No red streaking. The area is not  hot. The area is nontender. There is pain to palpation of the lateral midfoot. There is pain to palpation in the talar tendon area. There is mild swelling of the lateral malleolus. There is a large callus of the metatarsal heads, but no puncture wound of the plantar surface. The Achilles tendon is intact.  Lymphadenopathy:       Head (right side): No submandibular adenopathy present.       Head (left side): No submandibular adenopathy present.    She has no cervical adenopathy.  Neurological: She is alert and oriented to person, place, and time. She has normal strength. No cranial nerve deficit or sensory deficit.  Skin: Skin is warm and dry.  Psychiatric: She has a normal mood and affect. Her speech is normal.    ED Course  Procedures (including critical care time) Pulse oximetry 95% on room air. Within normal limits by my interpretation. Labs Reviewed - No data to display Dg Ankle Complete Right  07/03/2012  *RADIOLOGY REPORT*  Clinical Data: Right ankle pain/swelling  RIGHT ANKLE - COMPLETE 3+ VIEW  Comparison: None.  Findings: No fracture or dislocation is seen.  The ankle mortise is intact.  Moderate generalized soft tissue swelling at the ankle.  IMPRESSION: No fracture or dislocation is seen.  Moderate soft tissue swelling.  Original Report Authenticated By: Charline Bills, M.D.   Dg Foot Complete Right  07/03/2012  *RADIOLOGY REPORT*  Clinical Data: Right foot pain/swelling, prior second toe amputation for diabetic complication  RIGHT FOOT COMPLETE - 3+ VIEW  Comparison: None.  Findings: No fracture or dislocation is seen.  Hallux valgus deformity.  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  Degenerative changes at the first MTP joint.  Small plantar and posterior calcaneal  enthesophytes.  Moderate soft tissue swelling along the dorsum of the midfoot.  IMPRESSION: No fracture or dislocation is seen.  Moderate dorsal soft tissue swelling.  Prior amputation of the second digit.  Original Report Authenticated By: Charline Bills, M.D.     No diagnosis found.    MDM  I have reviewed nursing notes, vital signs, and all appropriate lab and imaging results for this patient. The x-ray of the right ankle is negative for fracture or dislocation. The x-ray of the right foot is negative for fracture or dislocation. There is some moderate dorsal soft tissue swelling present. The patient has diabetic neuropathy. She has had the surgical procedure of the right foot. She is seen by a podiatrist for evaluation. The plan at this time is for the patient to receive an injection of Dilaudid to assist with her pain. The patient is already on Voltaren, Norco, and fentanyl patch. The patient will continue these at home and see the podiatry specialist July 25.       Kathie Dike, PA 07/04/12 0011  Kathie Dike, PA 07/04/12 0011  Kathie Dike, PA 07/04/12 0014  Kathie Dike, PA 07/04/12 [phone]

## 2012-07-05 ENCOUNTER — Ambulatory Visit: Payer: Self-pay | Admitting: Physician Assistant

## 2012-07-15 NOTE — Telephone Encounter (Signed)
Pt was having cramps with new cholesterol medication.  Will discuss at upcoming visit.

## 2012-07-23 ENCOUNTER — Ambulatory Visit: Payer: Medicare Other | Admitting: Pharmacist

## 2012-07-25 ENCOUNTER — Ambulatory Visit: Payer: Medicare Other | Admitting: Pulmonary Disease

## 2012-07-29 ENCOUNTER — Other Ambulatory Visit: Payer: Self-pay | Admitting: Internal Medicine

## 2012-07-29 LAB — HEPATIC FUNCTION PANEL
AST: 25 U/L (ref 0–37)
Bilirubin, Direct: 0.1 mg/dL (ref 0.0–0.3)
Total Bilirubin: 0.5 mg/dL (ref 0.3–1.2)

## 2012-07-29 LAB — LIPID PANEL
LDL Cholesterol: 114 mg/dL — ABNORMAL HIGH (ref 0–99)
Total CHOL/HDL Ratio: 3.8 Ratio

## 2012-07-31 ENCOUNTER — Ambulatory Visit (INDEPENDENT_AMBULATORY_CARE_PROVIDER_SITE_OTHER): Payer: Medicare Other | Admitting: Pharmacist

## 2012-07-31 VITALS — Wt 236.8 lb

## 2012-07-31 DIAGNOSIS — E785 Hyperlipidemia, unspecified: Secondary | ICD-10-CM

## 2012-07-31 NOTE — Patient Instructions (Addendum)
Wait for other health issues to resolve and appetite to come back, then we can re-evaluate need for cholesterol medication.  When you start eating again, resume healthier diet with portion control and more green/colorful vegetables.  Try to keep moving and increase walking as you are able with foot pain.    Return to clinic in 3 months.

## 2012-07-31 NOTE — Progress Notes (Signed)
Alexis Rios is in clinic with her husband for follow up visit for her hyperlipidemia. She is not currently taking any cholesterol medications.  She has been feeling nauseous since the beginning of July and has not been eating much. She reports having lost 26 lbs in 4 weeks.  She recently went to a podiatrist for foot pain, and she was put on multiple new pain meds (Fentanyl patch, Norco 7.5/325, Robaxin) and Zofran for her nausea. She also says she received shots in her foot (likely cortisone) but reports that pain persists. She does not seem to correlate her nausea with the pain medications.    Although she has not had much of an appetite, her diet has consisted mostly of soups (potato or tomato). Yesterday she reports having an appetite and had chicken and dumplings, mac and cheese, and fried okra.  She is drinking lots of water and no sweet tea.  She reports her blood glucose readings have been ranging from 90-120 fasting and about 150 postprandial.    She has not been very active.  With her nausea and foot pain, she reports not being able to do much. She reports her shortness of breath has not been "too bad." When she uses her CPAP at night, she has not been having issues. When she skips the CPAP, she wakes up, requiring her inhaler.    Lipid Panel     Component Value Date/Time   CHOL 187 07/29/2012 0707   TRIG 119 07/29/2012 0707   HDL 49 07/29/2012 0707   CHOLHDL 3.8 07/29/2012 0707   VLDL 24 07/29/2012 0707   LDLCALC 114* 07/29/2012 0707    Current Outpatient Prescriptions on File Prior to Visit  Medication Sig Dispense Refill  . albuterol (PROVENTIL HFA;VENTOLIN HFA) 108 (90 BASE) MCG/ACT inhaler Inhale 2 puffs into the lungs every 6 (six) hours as needed. For shortness of breath       . albuterol (PROVENTIL) (2.5 MG/3ML) 0.083% nebulizer solution Take 2.5 mg by nebulization every 6 (six) hours as needed.        . ALPRAZolam (XANAX) 0.5 MG tablet Take 0.5 mg by mouth 2 (two) times daily as  needed.       Marland Kitchen azelastine (ASTELIN) 137 MCG/SPRAY nasal spray Place 1 spray into both nostrils Twice daily.      . citalopram (CELEXA) 40 MG tablet Take 40 mg by mouth daily.        . diclofenac (VOLTAREN) 75 MG EC tablet Take 75 mg by mouth daily.       Elwin Sleight 200-5 MCG/ACT AERO Take 2 puffs by mouth 2 (two) times daily.       . fentaNYL (DURAGESIC - DOSED MCG/HR) 25 MCG/HR Place 1 patch onto the skin every 3 (three) days.       . fexofenadine (ALLEGRA) 180 MG tablet Take 180 mg by mouth every morning.       . fluticasone (FLONASE) 50 MCG/ACT nasal spray Place 2 sprays into the nose daily as needed.       . gabapentin (NEURONTIN) 300 MG capsule Take 300 mg by mouth 3 (three) times daily.        Marland Kitchen HYDROcodone-acetaminophen (NORCO) 7.5-325 MG per tablet Take 1-2 tablets by mouth. Every 6 hours as needed for pain      . ipratropium (ATROVENT) 0.02 % nebulizer solution Take 500 mcg by nebulization 4 (four) times daily as needed. For shortness of breath       . metFORMIN (GLUCOPHAGE-XR) 500  MG 24 hr tablet Take 2 tablets by mouth Twice daily.      . methocarbamol (ROBAXIN) 750 MG tablet Take 750 mg by mouth 3 (three) times daily.      Marland Kitchen omeprazole (PRILOSEC) 20 MG capsule Take 1 tablet by mouth Twice daily.      . ondansetron (ZOFRAN-ODT) 4 MG disintegrating tablet Take 4 mg by mouth. EVERY 4-6 HOURS AS NEEDED      . topiramate (TOPAMAX) 25 MG capsule Take 25 mg by mouth at bedtime.          Allergies  Allergen Reactions  . Codeine Itching  . Cymbalta (Duloxetine Hcl) Other (See Comments)    Cannot urinate  . Keflet (Cephalexin Monohydrate) Itching  . Sulfa Antibiotics Swelling  . Tape Rash

## 2012-07-31 NOTE — Assessment & Plan Note (Addendum)
Assessment: LDL is not at goal at 114 mg/dl  (goal <161 ,<09 optimal). LDL improved since last visit even with no medication, likely due to not eating. Her nausea may be due to multiple pain meds that have been added but pt continues to have tests to determine cause.  Plan:  Wait for other health issues to resolve and appetite to come back, then we can re-evaluate need for cholesterol medication. Consider low dose Lipitor if cholesterol increases with resumed eating. When you start eating again, resume healthier diet with portion control and more green/colorful vegetables. Try to keep moving and increase walking as you are able with foot pain.   Return to clinic in 3 months  Pt seen under supervision of Weston Brass, PharmD

## 2012-08-08 ENCOUNTER — Encounter: Payer: Self-pay | Admitting: Internal Medicine

## 2012-08-09 ENCOUNTER — Ambulatory Visit (INDEPENDENT_AMBULATORY_CARE_PROVIDER_SITE_OTHER): Payer: Medicare Other | Admitting: Pulmonary Disease

## 2012-08-09 ENCOUNTER — Encounter: Payer: Self-pay | Admitting: Pulmonary Disease

## 2012-08-09 VITALS — BP 116/80 | HR 88 | Temp 97.6°F | Ht 60.0 in | Wt 235.0 lb

## 2012-08-09 DIAGNOSIS — R0989 Other specified symptoms and signs involving the circulatory and respiratory systems: Secondary | ICD-10-CM

## 2012-08-09 DIAGNOSIS — R0600 Dyspnea, unspecified: Secondary | ICD-10-CM

## 2012-08-09 DIAGNOSIS — R0609 Other forms of dyspnea: Secondary | ICD-10-CM

## 2012-08-09 NOTE — Patient Instructions (Signed)
Follow up as needed

## 2012-08-09 NOTE — Assessment & Plan Note (Signed)
She has undergone extensive pulmonary evaluation which has been unrevealing except for deconditioning noted on cardiopulmonary exercise test.  She reports benefit from intermittent inhaler use, and she can continue this through her primary physician.    Advised her to call if she needs additional help but that she does not need scheduled pulmonary follow up.

## 2012-08-09 NOTE — Progress Notes (Signed)
Chief Complaint  Patient presents with  . Follow-up    here to discuss cardiopulmonary ecercise test. gets SOB w/ exertion. denies any wheezing, chest tx    History of Present Illness: Alexis Rios is a 66 y.o. female never smoker with dyspnea.   CPST 06/03/12>>deconditioning.  She still gets winded with activity.  She will sometimes get winded at rest.  She uses her inhalers, and then feels better in seconds.   Past Medical History  Diagnosis Date  . Diabetes mellitus   . Asthma   . Chronic back pain   . Hypercholesteremia   . Seasonal allergies   . Chronic headaches   . Sleep apnea   . S/P cardiac catheterization     LHC 03/2008: Mid AV groove circumflex 30%, mid RCA 20%, EF 60%  . H/O echocardiogram     Echo 09/2011: Mild LVH, EF 60-65%, grade 1 diastolic dysfunction, trivial AI, mild MR, mild LAE.  . H/O exercise stress test     Dobutamine Myoview 09/2011: No scar or ischemia, no EKG changes, study not gated.  . Cancer   . Skin cancer     scc/bcc    Past Surgical History  Procedure Date  . Cholecystectomy   . Tonsillectomy   . Shoulder surg     x2 R  . Back surgery     x3  . Knee surgery     R  . Foot surgery     R  . Wrist surgery     L  . Appendectomy   . Abdominal hysterectomy     partial  . Breast surgery     lump x 2 R    Outpatient Encounter Prescriptions as of 08/09/2012  Medication Sig Dispense Refill  . albuterol (PROVENTIL HFA;VENTOLIN HFA) 108 (90 BASE) MCG/ACT inhaler Inhale 2 puffs into the lungs every 6 (six) hours as needed. For shortness of breath       . albuterol (PROVENTIL) (2.5 MG/3ML) 0.083% nebulizer solution Take 2.5 mg by nebulization every 6 (six) hours as needed.        . ALPRAZolam (XANAX) 0.5 MG tablet Take 0.5 mg by mouth 2 (two) times daily as needed.       Marland Kitchen azelastine (ASTELIN) 137 MCG/SPRAY nasal spray Place 1 spray into both nostrils Twice daily.      . citalopram (CELEXA) 40 MG tablet Take 40 mg by mouth daily.         Marland Kitchen DEXILANT 60 MG capsule 1 capsule twice a day      . diclofenac (VOLTAREN) 75 MG EC tablet Take 75 mg by mouth daily.       Elwin Sleight 200-5 MCG/ACT AERO Take 2 puffs by mouth 2 (two) times daily.       . fentaNYL (DURAGESIC - DOSED MCG/HR) 25 MCG/HR Place 1 patch onto the skin as needed.       . fexofenadine (ALLEGRA) 180 MG tablet Take 180 mg by mouth every morning.       . fluticasone (FLONASE) 50 MCG/ACT nasal spray Place 2 sprays into the nose daily as needed.       . gabapentin (NEURONTIN) 300 MG capsule Take 300 mg by mouth 3 (three) times daily.        Marland Kitchen HYDROcodone-acetaminophen (NORCO) 7.5-325 MG per tablet Take 1-2 tablets by mouth. Every 6 hours as needed for pain      . ipratropium (ATROVENT) 0.02 % nebulizer solution Take 500 mcg by nebulization 4 (four)  times daily as needed. For shortness of breath       . metFORMIN (GLUCOPHAGE-XR) 500 MG 24 hr tablet Take 2 tablets by mouth Twice daily.      . methocarbamol (ROBAXIN) 750 MG tablet Take 750 mg by mouth 3 (three) times daily.      . metoCLOPramide (REGLAN) 5 MG tablet 3 times per week      . omeprazole (PRILOSEC) 20 MG capsule Take 1 tablet by mouth Twice daily.      . ondansetron (ZOFRAN-ODT) 4 MG disintegrating tablet Take 4 mg by mouth. EVERY 4-6 HOURS AS NEEDED      . topiramate (TOPAMAX) 25 MG capsule Take 25 mg by mouth at bedtime.          Allergies  Allergen Reactions  . Codeine Itching  . Cymbalta (Duloxetine Hcl) Other (See Comments)    Cannot urinate  . Keflet (Cephalexin Monohydrate) Itching  . Sulfa Antibiotics Swelling  . Tape Rash    Physical Exam:  Blood pressure 116/80, pulse 88, temperature 97.6 F (36.4 C), temperature source Oral, height 5' (1.524 m), weight 235 lb (106.595 kg), SpO2 93.00%.  Body mass index is 45.90 kg/(m^2). Wt Readings from Last 2 Encounters:  08/09/12 235 lb (106.595 kg)  07/31/12 236 lb 12.8 oz (107.412 kg)    General - Obese  HEENT - PERRLA, EOMI, no sinus tenderness,  narrow nasal angles, MP 3, no oral exudate, no LAN  Cardiac - s1s2 regular, no murmur  Chest - decreased respiratory excursion, no wheeze/rales/dullness  Abdomen - obese, soft, non tender  Extremities - minimal ankle edema  Neurologic - normal strength, CN intact  Skin - no rashes  Psychiatric - normal mood, behavior   Assessment/Plan:  Coralyn Helling, MD [facility] Pager:  [phone] 08/09/2012, 2:55 PM

## 2012-09-04 ENCOUNTER — Ambulatory Visit: Payer: Medicare Other | Admitting: Internal Medicine

## 2012-09-05 ENCOUNTER — Encounter: Payer: Self-pay | Admitting: Internal Medicine

## 2012-09-09 ENCOUNTER — Ambulatory Visit (INDEPENDENT_AMBULATORY_CARE_PROVIDER_SITE_OTHER): Payer: Medicare Other | Admitting: Internal Medicine

## 2012-09-09 ENCOUNTER — Encounter: Payer: Self-pay | Admitting: Internal Medicine

## 2012-09-09 ENCOUNTER — Telehealth: Payer: Self-pay | Admitting: Gastroenterology

## 2012-09-09 ENCOUNTER — Other Ambulatory Visit: Payer: Self-pay | Admitting: Gastroenterology

## 2012-09-09 VITALS — BP 144/84 | HR 72 | Ht 60.0 in | Wt 235.0 lb

## 2012-09-09 DIAGNOSIS — R110 Nausea: Secondary | ICD-10-CM

## 2012-09-09 DIAGNOSIS — R197 Diarrhea, unspecified: Secondary | ICD-10-CM

## 2012-09-09 DIAGNOSIS — K219 Gastro-esophageal reflux disease without esophagitis: Secondary | ICD-10-CM

## 2012-09-09 DIAGNOSIS — R634 Abnormal weight loss: Secondary | ICD-10-CM

## 2012-09-09 MED ORDER — DIPHENOXYLATE-ATROPINE 2.5-0.025 MG PO TABS: 1.0000 | ORAL_TABLET | Freq: Every day | ORAL | Status: DC | PRN

## 2012-09-09 MED ORDER — METOCLOPRAMIDE HCL 5 MG PO TABS: 5.0000 mg | ORAL_TABLET | Freq: Two times a day (BID) | ORAL | Status: DC | PRN

## 2012-09-09 MED ORDER — PANTOPRAZOLE SODIUM 40 MG PO TBEC: 40.0000 mg | DELAYED_RELEASE_TABLET | Freq: Every day | ORAL | Status: DC

## 2012-09-09 NOTE — Telephone Encounter (Signed)
Called pt because on her new patient paperwork for her visit with Dr. Rhea Belton today circled HIV, pt is not currently taking any medication or getting any therapy for this. I called to see if she circled it by mistake. Pt stated she was tested a few years ago in [place] and was told she was HIV positive. Dr. Rhea Belton has ordered labs to check again, based on previous Dr. Notes we received show no mention of HIV. Informed pt to come in to be re-tested. She said she will be in tomorrow.

## 2012-09-09 NOTE — Progress Notes (Signed)
Patient ID: Alexis Rios, female   DOB: 1946-09-24, 66 y.o.   MRN: [id number]  SUBJECTIVE: HPI Alexis Rios is a 66 yo female with PMH of diabetes mellitus, hypertension, hyperlipidemia, COPD, CAD, diverticulosis, GERD, skin cancer who seen in consultation at the request of Dr. Patrecia Pace for evaluation of nausea, vomiting, and weight loss.  The patient reports a proximally 6 weeks of significant nausea, with 2 isolated episodes of vomiting. This was not associated with abdominal pain, but she reports when examined she has epigastric and right upper quadrant pain. This nausea started in July 2013, and was a daily occurrence. It has improved some to this point, and is now much more infrequent.  She also reports issues with heartburn on a near daily basis but no dysphagia or odynophagia. When her nausea was bad she was having much trouble eating and with poor appetite she reports approximately 30 pound weight loss, but feels that she has gained about 5 pounds back recently. She is able to eat recently without much nausea. She was given a prescription for Reglan 5 mg twice a day as well as Dexilant 60 mg daily. She reports to Dexilant help significantly with reflux, but was too expensive. She also felt the Reglan helped her a great deal. She does feel that her nausea worsened around the time she was started on a fentanyl patch for lower back pain. She also stopped the fentanyl patch approximately a month ago, and the nausea started to improve 3 weeks ago. She is debating about resuming but no patch given her lower back pain recently. She reports that over the last several months her stools have become more frequent and loose. Normal for her is a bowel movement every day to every other day with formed brown stool. More recently she's had 3-4 stools daily and they are loose and at times diarrhea. She denies melena or rectal bleeding. She's had no lower abdominal pain or cramping associated with her loose stools. She  denies fevers and chills.  She reports a recent CT scan performed in [place] as well as a gastric emptying study.  Review of Systems  As per history of present illness, otherwise negative   Past Medical History  Diagnosis Date  . Diabetes mellitus   . Asthma   . Chronic back pain   . Hypercholesteremia   . Seasonal allergies   . Chronic headaches   . Sleep apnea   . S/P cardiac catheterization     LHC 03/2008: Mid AV groove circumflex 30%, mid RCA 20%, EF 60%  . H/O echocardiogram     Echo 09/2011: Mild LVH, EF 60-65%, grade 1 diastolic dysfunction, trivial AI, mild MR, mild LAE.  . H/O exercise stress test     Dobutamine Myoview 09/2011: No scar or ischemia, no EKG changes, study not gated.  . Skin cancer     scc/bcc  . Anxiety   . Arthritis   . COPD (chronic obstructive pulmonary disease)   . Depression   . History of gallstones   . IBS (irritable bowel syndrome)   . History of pneumonia   . Carpal tunnel syndrome   . Diverticulosis   . GERD (gastroesophageal reflux disease)   . Subclinical hypothyroidism   . Peripheral neuropathy   . Osteoporosis   . Gastroparesis     ?    Current Outpatient Prescriptions  Medication Sig Dispense Refill  . albuterol (PROVENTIL HFA;VENTOLIN HFA) 108 (90 BASE) MCG/ACT inhaler Inhale 2 puffs into the lungs  every 6 (six) hours as needed. For shortness of breath       . albuterol (PROVENTIL) (2.5 MG/3ML) 0.083% nebulizer solution Take 2.5 mg by nebulization every 6 (six) hours as needed.        . ALPRAZolam (XANAX) 0.5 MG tablet Take 0.5 mg by mouth 2 (two) times daily as needed.       . citalopram (CELEXA) 40 MG tablet Take 40 mg by mouth daily.        . diclofenac (VOLTAREN) 75 MG EC tablet Take 75 mg by mouth 2 (two) times daily.       Elwin Sleight 200-5 MCG/ACT AERO Take 2 puffs by mouth 2 (two) times daily.       . fexofenadine (ALLEGRA) 180 MG tablet Take 180 mg by mouth every morning.       . fluticasone (FLONASE) 50 MCG/ACT nasal  spray Place 2 sprays into the nose daily as needed.       . gabapentin (NEURONTIN) 300 MG capsule Take 300 mg by mouth 3 (three) times daily.        Marland Kitchen HYDROcodone-acetaminophen (NORCO) 7.5-325 MG per tablet Take 1-2 tablets by mouth. Every 6 hours as needed for pain      . ipratropium (ATROVENT) 0.02 % nebulizer solution Take 500 mcg by nebulization 4 (four) times daily as needed. For shortness of breath       . metFORMIN (GLUCOPHAGE-XR) 500 MG 24 hr tablet Take 2 tablets by mouth Twice daily.      . methocarbamol (ROBAXIN) 750 MG tablet Take 750 mg by mouth 3 (three) times daily.      . ondansetron (ZOFRAN-ODT) 4 MG disintegrating tablet Take 4 mg by mouth. EVERY 4-6 HOURS AS NEEDED      . topiramate (TOPAMAX) 25 MG capsule Take 25 mg by mouth at bedtime.        . TRAMADOL-ACETAMINOPHEN PO Take by mouth. Take 1-2 tablets by mouth every 4-6 hours as needed      . diphenoxylate-atropine (LOMOTIL) 2.5-0.025 MG per tablet Take 1 tablet by mouth daily as needed for diarrhea or loose stools.  30 tablet  0  . fentaNYL (DURAGESIC - DOSED MCG/HR) 25 MCG/HR Place 1 patch onto the skin as needed.       . metoCLOPramide (REGLAN) 5 MG tablet Take 1 tablet (5 mg total) by mouth 2 (two) times daily as needed.  30 tablet  0  . pantoprazole (PROTONIX) 40 MG tablet Take 1 tablet (40 mg total) by mouth daily.  30 tablet  1    Allergies  Allergen Reactions  . Codeine Itching  . Cymbalta (Duloxetine Hcl) Other (See Comments)    Cannot urinate  . Keflet (Cephalexin Monohydrate) Itching  . Sulfa Antibiotics Swelling  . Tape Rash    Family History  Problem Relation Age of Onset  . Allergies Daughter   . Liver cancer Brother   . Leukemia Brother   . Emphysema Brother   . Diabetes Other     all brothers and sister x 5  . Heart disease Mother   . Heart disease Father   . Stroke Father     History  Substance Use Topics  . Smoking status: Never Smoker   . Smokeless tobacco: Never Used  . Alcohol Use:  No    OBJECTIVE: BP 144/84  Pulse 72  Ht 5' (1.524 m)  Wt 235 lb (106.595 kg)  BMI 45.90 kg/m2 Constitutional: Well-developed and well-nourished. No distress. HEENT: Normocephalic  and atraumatic. Prior skin surgery to nose with graft, well healed. Oropharynx is clear and moist. No oropharyngeal exudate. Conjunctivae are normal. No scleral icterus. Neck: Neck supple. Trachea midline. Cardiovascular: Normal rate, regular rhythm and intact distal pulses. No M/R/G Pulmonary/chest: Effort normal and breath sounds normal. No wheezing, rales or rhonchi. Abdominal: Soft, obese, mild epigastric tenderness without rebound or guarding, nondistended. Bowel sounds active throughout. There are no masses palpable. No hepatosplenomegaly. Extremities: no clubbing, cyanosis, 1+ lower extremity edema Lymphadenopathy: No cervical adenopathy noted. Neurological: Alert and oriented to person place and time. Skin: Skin is warm and dry. No rashes noted. Psychiatric: Normal mood and affect. Behavior is normal.  Labs and Imaging -- Gastric emptying study dated 07/29/2012 -- findings: The tracer passes through the esophagus normally. The T1/2 solid phase gastric emptying is likely to be 81 minutes.  Impression: Abnormal study compatible with gastric peristalsis and delayed emptying  CT scan abdomen and pelvis with contrast dated 08/02/2012 -- impression: No source of abdominal pain identified. (This result will be scanned into EMR)   ASSESSMENT AND PLAN: 66 yo female with PMH of diabetes mellitus, hypertension, hyperlipidemia, COPD, CAD, diverticulosis, GERD, skin cancer who seen in consultation at the request of Dr. Patrecia Pace for evaluation of nausea, vomiting, and weight loss.  1.  N/V/weight loss -- the patient's nausea and vomiting overall has improved, and seems to have corresponded to the use of fentanyl patch for lower back pain.  The gastric emptying study suggestive of gastroparesis, which would be  expected to worsen in the setting of narcotic pain medication use.  Given her concomitant heartburn, and weight loss, I recommended direct visualization with EGD. We discussed this test including the risks and benefits and she is agreeable to proceed. The recent CT scan findings are unremarkable and reassuring. If her EGD is normal, then I think gastroparesis is the most likely etiology. We discussed this diagnosis and its association with diabetes and narcotic pain medication. I recommended that she avoid thin female with possible, and she is given a low residue/gastroparesis diet today. We also discussed therapy with metoclopramide including the long-term risk profile which includes tardive dyskinesia and other neurologic problems.  I advised her that tardive dyskinesia is irreversible, but rare. When she uses metoclopramide, I would like her to use this at the lowest dose possible, for the shortest time possible, and take drug holidays if she winds up needing it more frequently.  For now she has been using a low dose, and I will give her 5 mg to be used twice daily on an as-needed basis only. She had a favorable response, and if she needs this more frequently, we can discuss switching to domperidone. Further recommendations after EGD.  2.  GERD -- she had a favorable response to Dexilant, but the medication was cost prohibitive.I will prescribe pantoprazole 40 mg daily. See #1   3.  Diarrhea/loose stools  -- I will perform stool studies today to include C. difficile, ova and parasite, stool culture, fecal leukocytes, and fecal elastase. She reports she is up-to-date on colorectal cancer screening and we have requested results of her previous colonoscopy. She is given a prescription for Lomotil to be used 4 times a day when necessary for diarrhea.  We will discuss this further at followup

## 2012-09-09 NOTE — Patient Instructions (Addendum)
You have been scheduled for an endoscopy with propofol. Please follow written instructions given to you at your visit today. If you use inhalers (even only as needed), please bring them with you on the day of your procedure.   We have sent  medications to your pharmacy for you to pick up at your convenience.

## 2012-09-10 ENCOUNTER — Other Ambulatory Visit (INDEPENDENT_AMBULATORY_CARE_PROVIDER_SITE_OTHER): Payer: Medicare Other

## 2012-09-10 DIAGNOSIS — R197 Diarrhea, unspecified: Secondary | ICD-10-CM

## 2012-09-10 DIAGNOSIS — R110 Nausea: Secondary | ICD-10-CM

## 2012-09-10 LAB — CBC
HCT: 38.7 % (ref 36.0–46.0)
Hemoglobin: 12.3 g/dL (ref 12.0–15.0)
Platelets: 218 10*3/uL (ref 150.0–400.0)
RDW: 17.2 % — ABNORMAL HIGH (ref 11.5–14.6)
WBC: 5.7 10*3/uL (ref 4.5–10.5)

## 2012-09-10 LAB — COMPREHENSIVE METABOLIC PANEL
AST: 22 U/L (ref 0–37)
Albumin: 3.5 g/dL (ref 3.5–5.2)
BUN: 12 mg/dL (ref 6–23)
CO2: 29 mEq/L (ref 19–32)
Calcium: 8.9 mg/dL (ref 8.4–10.5)
Chloride: 102 mEq/L (ref 96–112)
Creatinine, Ser: 0.8 mg/dL (ref 0.4–1.2)
GFR: 79.28 mL/min (ref >60.00)
Glucose, Bld: 104 mg/dL — ABNORMAL HIGH (ref 70–99)

## 2012-09-12 ENCOUNTER — Encounter: Payer: Self-pay | Admitting: Internal Medicine

## 2012-09-12 ENCOUNTER — Other Ambulatory Visit: Payer: Self-pay | Admitting: *Deleted

## 2012-09-12 ENCOUNTER — Other Ambulatory Visit: Payer: Self-pay | Admitting: Internal Medicine

## 2012-09-12 ENCOUNTER — Ambulatory Visit (AMBULATORY_SURGERY_CENTER): Payer: Medicare Other | Admitting: Internal Medicine

## 2012-09-12 ENCOUNTER — Ambulatory Visit: Payer: Medicare Other

## 2012-09-12 VITALS — BP 128/68 | HR 60 | Temp 97.6°F | Resp 16 | Ht 60.0 in | Wt 235.0 lb

## 2012-09-12 DIAGNOSIS — R112 Nausea with vomiting, unspecified: Secondary | ICD-10-CM

## 2012-09-12 DIAGNOSIS — K2990 Gastroduodenitis, unspecified, without bleeding: Secondary | ICD-10-CM

## 2012-09-12 DIAGNOSIS — R197 Diarrhea, unspecified: Secondary | ICD-10-CM

## 2012-09-12 DIAGNOSIS — R110 Nausea: Secondary | ICD-10-CM

## 2012-09-12 DIAGNOSIS — R1110 Vomiting, unspecified: Secondary | ICD-10-CM

## 2012-09-12 DIAGNOSIS — K319 Disease of stomach and duodenum, unspecified: Secondary | ICD-10-CM

## 2012-09-12 DIAGNOSIS — K2970 Gastritis, unspecified, without bleeding: Secondary | ICD-10-CM

## 2012-09-12 DIAGNOSIS — K219 Gastro-esophageal reflux disease without esophagitis: Secondary | ICD-10-CM

## 2012-09-12 LAB — GLUCOSE, CAPILLARY: Glucose-Capillary: 97 mg/dL (ref 70–99)

## 2012-09-12 MED ORDER — SODIUM CHLORIDE 0.9 % IV SOLN: 500.0000 mL | INTRAVENOUS | Status: DC

## 2012-09-12 NOTE — Patient Instructions (Addendum)
Handouts were given to your care partner on hiatal hernia and gastritis.  Per Dr. Rhea Belton continue current medications including pantoprazole for acid control.. Please call back to set up an appointment with Dr. Rhea Belton for 6 weeks to ensure improvement.  Your blood sugar was 108 in the recovery room.  You may resume your current medications today.  Please call if any questions or concerns.    YOU HAD AN ENDOSCOPIC PROCEDURE TODAY AT THE [facility]: Refer to the procedure report that was given to you for any specific questions about what was found during the examination.  If the procedure report does not answer your questions, please call your gastroenterologist to clarify.  If you requested that your care partner not be given the details of your procedure findings, then the procedure report has been included in a sealed envelope for you to review at your convenience later.  YOU SHOULD EXPECT: Some feelings of bloating in the abdomen. Passage of more gas than usual.  Walking can help get rid of the air that was put into your GI tract during the procedure and reduce the bloating. If you had a lower endoscopy (such as a colonoscopy or flexible sigmoidoscopy) you may notice spotting of blood in your stool or on the toilet paper. If you underwent a bowel prep for your procedure, then you may not have a normal bowel movement for a few days.  DIET: Your first meal following the procedure should be a light meal and then it is ok to progress to your normal diet.  A half-sandwich or bowl of soup is an example of a good first meal.  Heavy or fried foods are harder to digest and may make you feel nauseous or bloated.  Likewise meals heavy in dairy and vegetables can cause extra gas to form and this can also increase the bloating.  Drink plenty of fluids but you should avoid alcoholic beverages for 24 hours.  ACTIVITY: Your care partner should take you home directly after the procedure.  You should plan to  take it easy, moving slowly for the rest of the day.  You can resume normal activity the day after the procedure however you should NOT DRIVE or use heavy machinery for 24 hours (because of the sedation medicines used during the test).    SYMPTOMS TO REPORT IMMEDIATELY: A gastroenterologist can be reached at any hour.  During normal business hours, 8:30 AM to 5:00 PM Monday through Friday, call [phone].  After hours and on weekends, please call the GI answering service at [phone] who will take a message and have the physician on call contact you.     Following upper endoscopy (EGD)  Vomiting of blood or coffee ground material  New chest pain or pain under the shoulder blades  Painful or persistently difficult swallowing  New shortness of breath  Fever of 100F or higher  Black, tarry-looking stools  FOLLOW UP: If any biopsies were taken you will be contacted by phone or by letter within the next 1-3 weeks.  Call your gastroenterologist if you have not heard about the biopsies in 3 weeks.  Our staff will call the home number listed on your records the next business day following your procedure to check on you and address any questions or concerns that you may have at that time regarding the information given to you following your procedure. This is a courtesy call and so if there is no answer at the home number  and we have not heard from you through the emergency physician on call, we will assume that you have returned to your regular daily activities without incident.  SIGNATURES/CONFIDENTIALITY: You and/or your care partner have signed paperwork which will be entered into your electronic medical record.  These signatures attest to the fact that that the information above on your After Visit Summary has been reviewed and is understood.  Full responsibility of the confidentiality of this discharge information lies with you and/or your care-partner.

## 2012-09-12 NOTE — Progress Notes (Signed)
No complaints noted in the recovery room. Maw  Patient did not experience any of the following events: a burn prior to discharge; a fall within the facility; wrong site/side/patient/procedure/implant event; or a hospital transfer or hospital admission upon discharge from the facility. (G8907) Patient did not have preoperative order for IV antibiotic SSI prophylaxis. (G8918)  

## 2012-09-12 NOTE — Op Note (Signed)
[facility] [address] [place], [address]   ENDOSCOPY PROCEDURE REPORT  PATIENT: Alexis, Rios  MR#: [id number] BIRTHDATE: 10/08/1946 , 65  yrs. old GENDER: Female ENDOSCOPIST: Beverley Fiedler, MD REFERRED BY:  Wardell Heath. PROCEDURE DATE:  09/12/2012 PROCEDURE:  EGD w/ biopsy for H.pylori ASA CLASS:     Class III INDICATIONS:  nausea.   vomiting.   weight loss. MEDICATIONS: MAC sedation, administered by CRNA and propofol (Diprivan) 150mg  IV TOPICAL ANESTHETIC: Cetacaine Spray  DESCRIPTION OF PROCEDURE: After the risks benefits and alternatives of the procedure were thoroughly explained, informed consent was obtained.  The LB GIF-H180 [id number] endoscope was introduced through the mouth and advanced to the second portion of the duodenum. Without limitations.  The instrument was slowly withdrawn as the mucosa was fully examined.   ESOPHAGUS: The mucosa of the esophagus appeared normal.  STOMACH: A hiatus hernia was found.   Mild gastropathy with a few scattered erosions was found in the gastric antrum.  Biopsies were taken in the antrum and angularis.  DUODENUM: The duodenal mucosa showed no abnormalities in the bulb and second portion of the duodenum.  Retroflexed views revealed a hiatal hernia.     The scope was then withdrawn from the patient and the procedure completed.  COMPLICATIONS: There were no complications. ENDOSCOPIC IMPRESSION: 1.   The mucosa of the esophagus appeared normal 2.   Small hiatus hernia was found 3.   Gastropathy was found in the gastric antrum; biopsies were taken in the antrum and angularis 4.   The duodenal mucosa showed no abnormalities in the bulb and second portion of the duodenum  RECOMMENDATIONS: 1.  Await pathology results 2.  Continue current medications, including pantoprazole for acid control. 3.  Office follow-up in about 6 weeks to ensure improvement.  eSigned:  Beverley Fiedler, MD 09/12/2012 10:41  AM

## 2012-09-13 ENCOUNTER — Other Ambulatory Visit: Payer: Self-pay | Admitting: Internal Medicine

## 2012-09-13 ENCOUNTER — Telehealth: Payer: Self-pay | Admitting: *Deleted

## 2012-09-13 LAB — FECAL LACTOFERRIN, QUANT: Lactoferrin: NEGATIVE

## 2012-09-13 LAB — OVA AND PARASITE SCREEN: OP: NONE SEEN

## 2012-09-13 NOTE — Telephone Encounter (Signed)
  Follow up Call-  Call back number 09/12/2012  Post procedure Call Back phone  # [phone]  Permission to leave phone message Yes     [facility]

## 2012-09-16 LAB — STOOL CULTURE

## 2012-09-19 ENCOUNTER — Encounter: Payer: Self-pay | Admitting: Internal Medicine

## 2012-10-24 ENCOUNTER — Encounter: Payer: Self-pay | Admitting: Internal Medicine

## 2012-10-25 ENCOUNTER — Ambulatory Visit (INDEPENDENT_AMBULATORY_CARE_PROVIDER_SITE_OTHER): Payer: Medicare Other | Admitting: Internal Medicine

## 2012-10-25 ENCOUNTER — Encounter: Payer: Self-pay | Admitting: Internal Medicine

## 2012-10-25 VITALS — BP 152/82 | HR 61 | Ht 60.0 in | Wt 226.0 lb

## 2012-10-25 DIAGNOSIS — R194 Change in bowel habit: Secondary | ICD-10-CM

## 2012-10-25 DIAGNOSIS — R198 Other specified symptoms and signs involving the digestive system and abdomen: Secondary | ICD-10-CM

## 2012-10-25 DIAGNOSIS — R195 Other fecal abnormalities: Secondary | ICD-10-CM

## 2012-10-25 DIAGNOSIS — Z1211 Encounter for screening for malignant neoplasm of colon: Secondary | ICD-10-CM

## 2012-10-25 MED ORDER — ALIGN PO CAPS: 1.0000 | ORAL_CAPSULE | Freq: Every day | ORAL | Status: DC

## 2012-10-25 MED ORDER — PEG-KCL-NACL-NASULF-NA ASC-C 100 G PO SOLR: 1.0000 | Freq: Once | ORAL | Status: DC

## 2012-10-25 NOTE — Patient Instructions (Addendum)
You have been scheduled for a colonoscopy with propofol. Please follow written instructions given to you at your visit today.  Please pick up your prep kit at the pharmacy within the next 1-3 days. If you use inhalers (even only as needed) or a CPAP machine, please bring them with you on the day of your procedure.  We have given you samples of Align. This puts good bacteria back into your colon. You should take 1 capsule by mouth once daily. If this works well for you, it can be purchased over the counter.  You can purchase Benefiber over the counter to take daily.

## 2012-10-25 NOTE — Progress Notes (Signed)
Subjective:    Patient ID: Alexis Rios, female    DOB: 05-Jan-1946, 66 y.o.   MRN: [id number]  HPI Alexis Rios is a 66 yo female with PMH of diabetes mellitus, hypertension, hyperlipidemia, COPD, CAD, diverticulosis, GERD, skin cancer who seen in followup. She was initially seen several months ago for evaluation of nausea and vomiting. She underwent upper endoscopy on 09/12/2012 which revealed normal esophagus, small hiatus hernia, mild antral gastropathy, and normal duodenum.  Gastric biopsies revealed focal intestinal metaplasia with no H. pylori or malignancy. At that time it was determined that fentanyl patch may have contributed to her nausea. She stopped it on her nausea has resolved entirely. She's had no further vomiting. Appetite has been good. Weight has been stable now. She continues to have loose stools 3-4 times per day. This is not occurring at night. Very rarely she has lower abdominal cramping associated with her bowel movements which is alleviated after defecation. She denies any new medication changes, but wonders if this is due to her metformin. She states she's been on metformin for years. She did try Lomotil which she reports works, but she has not used this regularly because she is concerned about constipation. She had tried Imodium in the past without benefit. She recalls what sounds like a flexible sigmoidoscopy approximately 5 years ago. She states this was an in office procedure without sedation. To her knowledge this was normal. All of her loose stools date back to July 2013. No fevers or chills.  Review of Systems As per history of present illness, otherwise negative  Current Medications, Allergies, Past Medical History, Past Surgical History, Family History and Social History were reviewed in [employer] record.     Objective:   Physical Exam BP 152/82  Pulse 61  Ht 5' (1.524 m)  Wt 226 lb (102.513 kg)  BMI 44.14 kg/m2 Gen: awake, alert,  NAD HEENT: anicteric, op clear CV: RRR, no mrg Pulm: CTA b/l Abd: soft, NT/ND, +BS throughout Ext: no c/c/e Neuro: nonfocal  CBC    Component Value Date/Time   WBC 5.7 09/10/2012 1048   RBC 4.49 09/10/2012 1048   HGB 12.3 09/10/2012 1048   HCT 38.7 09/10/2012 1048   PLT 218.0 09/10/2012 1048   MCV 86.3 09/10/2012 1048   MCH 28.3 07/05/2011 1615   MCHC 31.7 09/10/2012 1048   RDW 17.2* 09/10/2012 1048   LYMPHSABS 3.7 05/07/2012 1705   MONOABS 1.3* 05/07/2012 1705   EOSABS 0.0 05/07/2012 1705   BASOSABS 0.0 05/07/2012 1705    CMP     Component Value Date/Time   NA 137 09/10/2012 1048   K 4.3 09/10/2012 1048   CL 102 09/10/2012 1048   CO2 29 09/10/2012 1048   GLUCOSE 104* 09/10/2012 1048   BUN 12 09/10/2012 1048   CREATININE 0.8 09/10/2012 1048   CALCIUM 8.9 09/10/2012 1048   PROT 6.9 09/10/2012 1048   ALBUMIN 3.5 09/10/2012 1048   AST 22 09/10/2012 1048   ALT 24 09/10/2012 1048   ALKPHOS 79 09/10/2012 1048   BILITOT 0.5 09/10/2012 1048   GFRNONAA >60 07/05/2011 1615   GFRAA >60 07/05/2011 1615   C diff, stool culture, ova and parasite exam -- negative Fecal elastase -- normal Fecal WBC -- not resulted (unknown reason)    Assessment & Plan:  66 yo female with PMH of diabetes mellitus, hypertension, hyperlipidemia, COPD, CAD, diverticulosis, GERD, skin cancer who seen in followup.  1.  Loose stools -- there is no  clear etiology the patient's loose stools to this point. Her stool studies were negative, and she had cross-sectional imaging performed in August 2013 for evaluation of abdominal pain which was unremarkable for source of abdominal pain. Likely her abdominal pain, nausea and vomiting have resolved and I feel that this was likely related to gastroparesis associated with fentanyl use. Certainly the medication alone may have contributed to nausea also. She's never had a full colonoscopy, and I recommended this test for further evaluation of her change in bowel habit. I recommend a trial of  Align one capsule daily in addition of Benefiber 1 tablespoon daily. Hopefully the additional 5 rule out bulbar stools. She can continue to use Lomotil as needed for diarrhea. Further recommendations to be made after colonoscopy. We discussed the test and she is agreeable to proceed.  2.  N/V -- resolved with discontinuation of fentanyl patch

## 2012-10-31 ENCOUNTER — Ambulatory Visit: Payer: Medicare Other | Admitting: Pharmacist

## 2012-11-01 ENCOUNTER — Encounter: Payer: Self-pay | Admitting: Internal Medicine

## 2012-11-01 ENCOUNTER — Ambulatory Visit (AMBULATORY_SURGERY_CENTER): Payer: Medicare Other | Admitting: Internal Medicine

## 2012-11-01 VITALS — BP 108/76 | HR 72 | Temp 98.1°F | Resp 22 | Ht 60.0 in | Wt 226.0 lb

## 2012-11-01 DIAGNOSIS — D126 Benign neoplasm of colon, unspecified: Secondary | ICD-10-CM

## 2012-11-01 DIAGNOSIS — Z1211 Encounter for screening for malignant neoplasm of colon: Secondary | ICD-10-CM

## 2012-11-01 DIAGNOSIS — R195 Other fecal abnormalities: Secondary | ICD-10-CM

## 2012-11-01 DIAGNOSIS — R198 Other specified symptoms and signs involving the digestive system and abdomen: Secondary | ICD-10-CM

## 2012-11-01 DIAGNOSIS — R197 Diarrhea, unspecified: Secondary | ICD-10-CM

## 2012-11-01 LAB — GLUCOSE, CAPILLARY
Glucose-Capillary: 97 mg/dL (ref 70–99)
Glucose-Capillary: 90 mg/dL (ref 70–99)

## 2012-11-01 MED ORDER — SODIUM CHLORIDE 0.9 % IV SOLN: 500.0000 mL | INTRAVENOUS | Status: DC

## 2012-11-01 NOTE — Progress Notes (Signed)
Pt. Has right forearm bandaged, states she had skin peel off approx. One week ago.  Right forearm above bandage is warm to touch And reddened.

## 2012-11-01 NOTE — Progress Notes (Addendum)
Patient does have an area about 2in x 2inches on her r wrist arm area;  Patient states that she has put OTC antibiotic ointments on it.   Dr. Rhea Belton looked at it, and stated for her to put bactroban on it.    The area is pink and warm, but looks like it might be getting infected. Dr. Catalina Pizza the patient and her husband to call their primary care doctor or to call him Monday if it doesn't get better.   I told her to give it til Sunday, but if the redness gets worse or it gets more swollen and painful to go to Urgent care.  Patient did not have preoperative order for IV antibiotic SSI prophylaxis. [id number]) Patient did not experience any of the following events: a burn prior to discharge; a fall within the facility; wrong site/side/patient/procedure/implant event; or a hospital transfer or hospital admission upon discharge from the facility. [phone])

## 2012-11-01 NOTE — Patient Instructions (Addendum)
YOU HAD AN ENDOSCOPIC PROCEDURE TODAY AT THE [facility]: Refer to the procedure report that was given to you for any specific questions about what was found during the examination.  If the procedure report does not answer your questions, please call your gastroenterologist to clarify.  If you requested that your care partner not be given the details of your procedure findings, then the procedure report has been included in a sealed envelope for you to review at your convenience later.  YOU SHOULD EXPECT: Some feelings of bloating in the abdomen. Passage of more gas than usual.  Walking can help get rid of the air that was put into your GI tract during the procedure and reduce the bloating. If you had a lower endoscopy (such as a colonoscopy or flexible sigmoidoscopy) you may notice spotting of blood in your stool or on the toilet paper. If you underwent a bowel prep for your procedure, then you may not have a normal bowel movement for a few days.  DIET: Your first meal following the procedure should be a light meal and then it is ok to progress to your normal diet.  A half-sandwich or bowl of soup is an example of a good first meal.  Heavy or fried foods are harder to digest and may make you feel nauseous or bloated.  Likewise meals heavy in dairy and vegetables can cause extra gas to form and this can also increase the bloating.  Drink plenty of fluids but you should avoid alcoholic beverages for 24 hours.  ACTIVITY: Your care partner should take you home directly after the procedure.  You should plan to take it easy, moving slowly for the rest of the day.  You can resume normal activity the day after the procedure however you should NOT DRIVE or use heavy machinery for 24 hours (because of the sedation medicines used during the test).    SYMPTOMS TO REPORT IMMEDIATELY: A gastroenterologist can be reached at any hour.  During normal business hours, 8:30 AM to 5:00 PM Monday through Friday,  call [phone].  After hours and on weekends, please call the GI answering service at [phone] who will take a message and have the physician on call contact you.   Following lower endoscopy (colonoscopy or flexible sigmoidoscopy):  Excessive amounts of blood in the stool  Significant tenderness or worsening of abdominal pains  Swelling of the abdomen that is new, acute  Fever of 100F or higher  FOLLOW UP: If any biopsies were taken you will be contacted by phone or by letter within the next 1-3 weeks.  Call your gastroenterologist if you have not heard about the biopsies in 3 weeks.  Our staff will call the home number listed on your records the next business day following your procedure to check on you and address any questions or concerns that you may have at that time regarding the information given to you following your procedure. This is a courtesy call and so if there is no answer at the home number and we have not heard from you through the emergency physician on call, we will assume that you have returned to your regular daily activities without incident.  SIGNATURES/CONFIDENTIALITY: You and/or your care partner have signed paperwork which will be entered into your electronic medical record.  These signatures attest to the fact that that the information above on your After Visit Summary has been reviewed and is understood.  Full responsibility of the confidentiality of this   discharge information lies with you and/or your care-partner.   Thank-you for choosing us for your healthcare needs. 

## 2012-11-01 NOTE — Op Note (Signed)
[facility] [address] [place], [address]   COLONOSCOPY PROCEDURE REPORT  PATIENT: Alexis, Rios  MR#: [id number] BIRTHDATE: 1946/03/27 , 65  yrs. old GENDER: Female ENDOSCOPIST: Beverley Fiedler, MD PROCEDURE DATE:  11/01/2012 PROCEDURE:   Colonoscopy with biopsy and Colonoscopy with snare polypectomy ASA CLASS:   Class III INDICATIONS:unexplained diarrhea. MEDICATIONS: MAC sedation, administered by CRNA and Propofol (Diprivan) 280 mg IV  DESCRIPTION OF PROCEDURE:   After the risks benefits and alternatives of the procedure were thoroughly explained, informed consent was obtained.  A digital rectal exam revealed no rectal mass.   The LB CF-[id number]  endoscope was introduced through the anus and advanced to the terminal ileum which was intubated for a short distance. No adverse events experienced.   The quality of the prep was good, using MoviPrep  The instrument was then slowly withdrawn as the colon was fully examined.   COLON FINDINGS: The mucosa appeared normal in the terminal ileum. A sessile polyp measuring 5 mm in size was found in the ascending colon.  A polypectomy was performed with a cold snare.  The resection was complete and the polyp tissue was completely retrieved.   The colonic mucosa appeared normal throughout the entire examined colon.  Multiple random biopsies of the area were performed.   Retroflexed views revealed internal hemorrhoids. The time to cecum=5 minutes 41 seconds.  Withdrawal time=9 minutes 43 seconds.  The scope was withdrawn and the procedure completed. COMPLICATIONS: There were no complications.  ENDOSCOPIC IMPRESSION: 1.   Normal mucosa in the terminal ileum 2.   Sessile polyp measuring 5 mm in size was found in the ascending colon; polypectomy was performed with a cold snare 3.   The colonic mucosa appeared normal throughout the entire examined colon; multiple random biopsies of the area were performed  4.    Small internal hemorrhoids  RECOMMENDATIONS: 1.  Await pathology results 2.  Continue the Align once daily and can use Lomotil as directed and as needed for diarrhea/loose stools 3.  Timing of repeat colonoscopy will be determined by pathology findings. 4.  You will receive a letter within 1-2 weeks with the results of your biopsy as well as final recommendations.  Please call my office if you have not received a letter after 3 weeks.   eSigned:  Beverley Fiedler, MD 11/01/2012 3:33 PM  cc: Horton Chin, MD and The Patient

## 2012-11-01 NOTE — Progress Notes (Signed)
USED INHALER FROM DULERA 2  PUFFS ON ADM 1501.

## 2012-11-04 ENCOUNTER — Telehealth: Payer: Self-pay | Admitting: *Deleted

## 2012-11-04 NOTE — Telephone Encounter (Signed)
No answer, message left for the patient. 

## 2012-11-06 ENCOUNTER — Encounter: Payer: Self-pay | Admitting: Internal Medicine

## 2012-12-06 ENCOUNTER — Other Ambulatory Visit: Payer: Self-pay | Admitting: Neurosurgery

## 2012-12-06 DIAGNOSIS — M549 Dorsalgia, unspecified: Secondary | ICD-10-CM

## 2012-12-13 ENCOUNTER — Ambulatory Visit
Admission: RE | Admit: 2012-12-13 | Discharge: 2012-12-13 | Disposition: A | Discharge status: Home / Self Care | Payer: Medicare Other | Source: Ambulatory Visit | Attending: Neurosurgery | Admitting: Neurosurgery

## 2012-12-13 VITALS — BP 125/62 | HR 58

## 2012-12-13 DIAGNOSIS — M549 Dorsalgia, unspecified: Secondary | ICD-10-CM

## 2012-12-13 MED ORDER — ONDANSETRON HCL 4 MG/2ML IJ SOLN: 4.0000 mg | Freq: Four times a day (QID) | INTRAMUSCULAR | Status: DC | PRN

## 2012-12-13 MED ORDER — DIAZEPAM 5 MG PO TABS
5.0000 mg | ORAL_TABLET | Freq: Once | ORAL | Status: AC
  Administered 2012-12-13: 5 mg via ORAL

## 2012-12-13 MED ORDER — IOHEXOL 180 MG/ML  SOLN
15.0000 mL | Freq: Once | INTRAMUSCULAR | Status: AC | PRN
  Administered 2012-12-13: 15 mL via INTRATHECAL

## 2012-12-13 NOTE — Progress Notes (Signed)
PT STATES SHE HAS BEEN OFF CITALOPRAM AND TRAMADOL FOR THE PAST 2 DAYS.

## 2013-01-01 ENCOUNTER — Other Ambulatory Visit: Payer: Self-pay | Admitting: Neurosurgery

## 2013-01-27 ENCOUNTER — Encounter (HOSPITAL_COMMUNITY): Payer: Self-pay | Admitting: Pharmacy Technician

## 2013-01-31 ENCOUNTER — Encounter (HOSPITAL_COMMUNITY): Payer: Self-pay

## 2013-01-31 ENCOUNTER — Encounter (HOSPITAL_COMMUNITY)
Admission: RE | Admit: 2013-01-31 | Discharge: 2013-01-31 | Disposition: A | Discharge status: Home / Self Care | Payer: Medicare Other | Source: Ambulatory Visit | Attending: Neurosurgery | Admitting: Neurosurgery

## 2013-01-31 HISTORY — DX: Adverse effect of unspecified anesthetic, initial encounter: T41.45XA

## 2013-01-31 HISTORY — DX: Urinary tract infection, site not specified: N39.0

## 2013-01-31 HISTORY — DX: Other complications of anesthesia, initial encounter: T88.59XA

## 2013-01-31 HISTORY — DX: Urinary calculus, unspecified: N20.9

## 2013-01-31 HISTORY — DX: Pneumonia, unspecified organism: J18.9

## 2013-01-31 LAB — TYPE AND SCREEN: Antibody Screen: NEGATIVE

## 2013-01-31 LAB — CBC
HCT: 39.1 % (ref 36.0–46.0)
Hemoglobin: 12.9 g/dL (ref 12.0–15.0)
MCHC: 33 g/dL (ref 30.0–36.0)

## 2013-01-31 LAB — SURGICAL PCR SCREEN: MRSA, PCR: NEGATIVE

## 2013-01-31 LAB — BASIC METABOLIC PANEL
BUN: 10 mg/dL (ref 6–23)
Chloride: 101 mEq/L (ref 96–112)
GFR calc Af Amer: >90 mL/min (ref >90)
Potassium: 3.8 mEq/L (ref 3.5–5.1)

## 2013-01-31 NOTE — Progress Notes (Addendum)
Note from dr Lorin Picket weaver, with results of echo and stress included 7/13,cxr 5,13, ekg 7,13, pulmonary note in epic Patient instructed to use nebulizer before coming as well as inhalers

## 2013-01-31 NOTE — Pre-Procedure Instructions (Addendum)
Keeva Reisen Marcum  01/31/2013   Your procedure is scheduled on:  02/07/13  Report to [facility] at 530 AM.  Call this number if you have problems the morning of surgery: [phone]   Remember:   Do not eat food or drink liquids after midnight.   Take these medicines the morning of surgery with A SIP OF WATER: xanax, inhalers,nebulizer, pain med, celexa, gabapentin   Do not wear jewelry, make-up or nail polish.  Do not wear lotions, powders, or perfumes. You may wear deodorant.  Do not shave 48 hours prior to surgery. Men may shave face and neck.  Do not bring valuables to the hospital.  Contacts, dentures or bridgework may not be worn into surgery.  Leave suitcase in the car. After surgery it may be brought to your room.  For patients admitted to the hospital, checkout time is 11:00 AM the day of  discharge.   Patients discharged the day of surgery will not be allowed to drive  home.  Name and phone number of your driver:   Special Instructions: Shower using CHG 2 nights before surgery and the night before surgery.  If you shower the day of surgery use CHG.  Use special wash - you have one bottle of CHG for all showers.  You should use approximately 1/3 of the bottle for each shower.   Please read over the following fact sheets that you were given: Pain Booklet, Coughing and Deep Breathing, Blood Transfusion Information, MRSA Information and Surgical Site Infection Prevention

## 2013-02-06 MED ORDER — VANCOMYCIN HCL IN DEXTROSE 1-5 GM/200ML-% IV SOLN
1000.0000 mg | INTRAVENOUS | Status: AC
  Administered 2013-02-07: 1000 mg via INTRAVENOUS
  Filled 2013-02-06: qty 200

## 2013-02-07 ENCOUNTER — Inpatient Hospital Stay (HOSPITAL_COMMUNITY): Payer: Medicare Other

## 2013-02-07 ENCOUNTER — Encounter (HOSPITAL_COMMUNITY)
Admission: RE | Disposition: A | Discharge status: Home / Self Care | Payer: Self-pay | Source: Ambulatory Visit | Attending: Neurosurgery

## 2013-02-07 ENCOUNTER — Inpatient Hospital Stay (HOSPITAL_COMMUNITY): Payer: Medicare Other | Admitting: Anesthesiology

## 2013-02-07 ENCOUNTER — Encounter (HOSPITAL_COMMUNITY): Payer: Self-pay | Admitting: Surgery

## 2013-02-07 ENCOUNTER — Encounter (HOSPITAL_COMMUNITY): Payer: Self-pay | Admitting: Anesthesiology

## 2013-02-07 ENCOUNTER — Inpatient Hospital Stay (HOSPITAL_COMMUNITY)
Admission: RE | Admit: 2013-02-07 | Discharge: 2013-02-10 | DRG: 460 | Disposition: A | Discharge status: Home / Self Care | Payer: Medicare Other | Source: Ambulatory Visit | Attending: Neurosurgery | Admitting: Neurosurgery

## 2013-02-07 DIAGNOSIS — M4310 Spondylolisthesis, site unspecified: Principal | ICD-10-CM | POA: Diagnosis present

## 2013-02-07 DIAGNOSIS — M48061 Spinal stenosis, lumbar region without neurogenic claudication: Secondary | ICD-10-CM | POA: Diagnosis present

## 2013-02-07 DIAGNOSIS — M810 Age-related osteoporosis without current pathological fracture: Secondary | ICD-10-CM | POA: Diagnosis present

## 2013-02-07 DIAGNOSIS — Z79899 Other long term (current) drug therapy: Secondary | ICD-10-CM

## 2013-02-07 DIAGNOSIS — J4489 Other specified chronic obstructive pulmonary disease: Secondary | ICD-10-CM | POA: Diagnosis present

## 2013-02-07 LAB — GLUCOSE, CAPILLARY
Glucose-Capillary: 103 mg/dL — ABNORMAL HIGH (ref 70–99)
Glucose-Capillary: 147 mg/dL — ABNORMAL HIGH (ref 70–99)

## 2013-02-07 SURGERY — POSTERIOR LUMBAR FUSION 1 LEVEL
Anesthesia: General | Site: Spine Lumbar | Wound class: Clean

## 2013-02-07 MED ORDER — HYDROMORPHONE HCL PF 1 MG/ML IJ SOLN
1.0000 mg | INTRAMUSCULAR | Status: DC | PRN
  Filled 2013-02-07: qty 1

## 2013-02-07 MED ORDER — SODIUM CHLORIDE 0.9 % IR SOLN
Status: DC | PRN
  Administered 2013-02-07: 09:00:00

## 2013-02-07 MED ORDER — INSULIN ASPART 100 UNIT/ML ~~LOC~~ SOLN
0.0000 [IU] | Freq: Three times a day (TID) | SUBCUTANEOUS | Status: DC
  Administered 2013-02-07 – 2013-02-10 (×2): 2 [IU] via SUBCUTANEOUS

## 2013-02-07 MED ORDER — LIDOCAINE HCL 4 % MT SOLN
OROMUCOSAL | Status: DC | PRN
  Administered 2013-02-07: 4 mL via TOPICAL

## 2013-02-07 MED ORDER — MENTHOL 3 MG MT LOZG: 1.0000 | LOZENGE | OROMUCOSAL | Status: DC | PRN

## 2013-02-07 MED ORDER — DEXAMETHASONE SODIUM PHOSPHATE 10 MG/ML IJ SOLN
10.0000 mg | INTRAMUSCULAR | Status: AC
  Administered 2013-02-07: 10 mg via INTRAVENOUS

## 2013-02-07 MED ORDER — ACETAMINOPHEN 650 MG RE SUPP: 650.0000 mg | RECTAL | Status: DC | PRN

## 2013-02-07 MED ORDER — DEXAMETHASONE SODIUM PHOSPHATE 10 MG/ML IJ SOLN: INTRAMUSCULAR | Status: DC | PRN

## 2013-02-07 MED ORDER — MIDAZOLAM HCL 5 MG/5ML IJ SOLN
INTRAMUSCULAR | Status: DC | PRN
  Administered 2013-02-07: 2 mg via INTRAVENOUS

## 2013-02-07 MED ORDER — METHOCARBAMOL 500 MG PO TABS
500.0000 mg | ORAL_TABLET | Freq: Four times a day (QID) | ORAL | Status: DC | PRN
  Administered 2013-02-09: 500 mg via ORAL
  Filled 2013-02-07: qty 1

## 2013-02-07 MED ORDER — GABAPENTIN 300 MG PO CAPS
300.0000 mg | ORAL_CAPSULE | Freq: Three times a day (TID) | ORAL | Status: DC
  Administered 2013-02-07 – 2013-02-10 (×9): 300 mg via ORAL
  Filled 2013-02-07 (×12): qty 1

## 2013-02-07 MED ORDER — LACTATED RINGERS IV SOLN
INTRAVENOUS | Status: DC | PRN
  Administered 2013-02-07 (×3): via INTRAVENOUS

## 2013-02-07 MED ORDER — METFORMIN HCL ER 500 MG PO TB24
1000.0000 mg | ORAL_TABLET | Freq: Two times a day (BID) | ORAL | Status: DC
  Administered 2013-02-09 – 2013-02-10 (×2): 1000 mg via ORAL
  Filled 2013-02-07 (×5): qty 2

## 2013-02-07 MED ORDER — HYDROCODONE-ACETAMINOPHEN 5-325 MG PO TABS
1.0000 | ORAL_TABLET | ORAL | Status: DC | PRN
  Administered 2013-02-07 – 2013-02-09 (×7): 2 via ORAL
  Filled 2013-02-07 (×8): qty 2

## 2013-02-07 MED ORDER — DEXAMETHASONE SODIUM PHOSPHATE 10 MG/ML IJ SOLN
INTRAMUSCULAR | Status: AC
  Filled 2013-02-07: qty 1

## 2013-02-07 MED ORDER — ARTIFICIAL TEARS OP OINT
TOPICAL_OINTMENT | OPHTHALMIC | Status: DC | PRN
  Administered 2013-02-07: 1 via OPHTHALMIC

## 2013-02-07 MED ORDER — ALPRAZOLAM 0.5 MG PO TABS: 0.5000 mg | ORAL_TABLET | Freq: Two times a day (BID) | ORAL | Status: DC | PRN

## 2013-02-07 MED ORDER — ONDANSETRON HCL 4 MG/2ML IJ SOLN
INTRAMUSCULAR | Status: DC | PRN
  Administered 2013-02-07: 4 mg via INTRAVENOUS

## 2013-02-07 MED ORDER — GLYCOPYRROLATE 0.2 MG/ML IJ SOLN
INTRAMUSCULAR | Status: DC | PRN
  Administered 2013-02-07: 0.2 mg via INTRAVENOUS
  Administered 2013-02-07: 0.6 mg via INTRAVENOUS

## 2013-02-07 MED ORDER — ONDANSETRON HCL 4 MG/2ML IJ SOLN: 4.0000 mg | INTRAMUSCULAR | Status: DC | PRN

## 2013-02-07 MED ORDER — VANCOMYCIN HCL IN DEXTROSE 1-5 GM/200ML-% IV SOLN
1000.0000 mg | Freq: Three times a day (TID) | INTRAVENOUS | Status: DC
  Administered 2013-02-07 – 2013-02-08 (×3): 1000 mg via INTRAVENOUS
  Filled 2013-02-07 (×7): qty 200

## 2013-02-07 MED ORDER — LIDOCAINE HCL (CARDIAC) 20 MG/ML IV SOLN
INTRAVENOUS | Status: DC | PRN
  Administered 2013-02-07: 70 mg via INTRAVENOUS

## 2013-02-07 MED ORDER — MOMETASONE FURO-FORMOTEROL FUM 200-5 MCG/ACT IN AERO
2.0000 | INHALATION_SPRAY | Freq: Two times a day (BID) | RESPIRATORY_TRACT | Status: DC
  Administered 2013-02-07 – 2013-02-10 (×6): 2 via RESPIRATORY_TRACT
  Filled 2013-02-07 (×2): qty 8.8

## 2013-02-07 MED ORDER — FENTANYL CITRATE 0.05 MG/ML IJ SOLN
INTRAMUSCULAR | Status: AC
  Filled 2013-02-07: qty 2

## 2013-02-07 MED ORDER — SODIUM CHLORIDE 0.9 % IJ SOLN
3.0000 mL | Freq: Two times a day (BID) | INTRAMUSCULAR | Status: DC
  Administered 2013-02-08 – 2013-02-09 (×2): 3 mL via INTRAVENOUS

## 2013-02-07 MED ORDER — POTASSIUM CHLORIDE IN NACL 20-0.45 MEQ/L-% IV SOLN
INTRAVENOUS | Status: DC
  Administered 2013-02-07: 15:00:00 via INTRAVENOUS
  Administered 2013-02-08: 1000 mL via INTRAVENOUS
  Administered 2013-02-08: 08:00:00 via INTRAVENOUS
  Filled 2013-02-07 (×7): qty 1000

## 2013-02-07 MED ORDER — INSULIN ASPART 100 UNIT/ML ~~LOC~~ SOLN
4.0000 [IU] | Freq: Three times a day (TID) | SUBCUTANEOUS | Status: DC
  Administered 2013-02-07 – 2013-02-10 (×6): 4 [IU] via SUBCUTANEOUS

## 2013-02-07 MED ORDER — PHENOL 1.4 % MT LIQD: 1.0000 | OROMUCOSAL | Status: DC | PRN

## 2013-02-07 MED ORDER — CITALOPRAM HYDROBROMIDE 40 MG PO TABS
40.0000 mg | ORAL_TABLET | Freq: Every day | ORAL | Status: DC
  Administered 2013-02-08 – 2013-02-10 (×3): 40 mg via ORAL
  Filled 2013-02-07 (×3): qty 1

## 2013-02-07 MED ORDER — TOPIRAMATE 25 MG PO CPSP: 50.0000 mg | ORAL_CAPSULE | Freq: Every day | ORAL | Status: DC

## 2013-02-07 MED ORDER — PANTOPRAZOLE SODIUM 40 MG IV SOLR
40.0000 mg | Freq: Every day | INTRAVENOUS | Status: DC
  Administered 2013-02-07 – 2013-02-09 (×3): 40 mg via INTRAVENOUS
  Filled 2013-02-07 (×5): qty 40

## 2013-02-07 MED ORDER — PNEUMOCOCCAL VAC POLYVALENT 25 MCG/0.5ML IJ INJ
0.5000 mL | INJECTION | INTRAMUSCULAR | Status: AC
  Administered 2013-02-08: 0.5 mL via INTRAMUSCULAR
  Filled 2013-02-07: qty 0.5

## 2013-02-07 MED ORDER — INSULIN ASPART 100 UNIT/ML ~~LOC~~ SOLN: 0.0000 [IU] | Freq: Every day | SUBCUTANEOUS | Status: DC

## 2013-02-07 MED ORDER — SODIUM CHLORIDE 0.9 % IV SOLN
INTRAVENOUS | Status: AC
  Filled 2013-02-07: qty 500

## 2013-02-07 MED ORDER — BACITRACIN 50000 UNITS IM SOLR
INTRAMUSCULAR | Status: AC
  Filled 2013-02-07: qty 1

## 2013-02-07 MED ORDER — SODIUM CHLORIDE 0.9 % IJ SOLN: 3.0000 mL | INTRAMUSCULAR | Status: DC | PRN

## 2013-02-07 MED ORDER — INFLUENZA VIRUS VACC SPLIT PF IM SUSP
0.5000 mL | INTRAMUSCULAR | Status: AC
  Administered 2013-02-08: 0.5 mL via INTRAMUSCULAR
  Filled 2013-02-07: qty 0.5

## 2013-02-07 MED ORDER — ALBUTEROL SULFATE (5 MG/ML) 0.5% IN NEBU: 2.5000 mg | INHALATION_SOLUTION | Freq: Four times a day (QID) | RESPIRATORY_TRACT | Status: DC | PRN

## 2013-02-07 MED ORDER — TOPIRAMATE 25 MG PO TABS
50.0000 mg | ORAL_TABLET | Freq: Every day | ORAL | Status: DC
  Administered 2013-02-07 – 2013-02-09 (×3): 50 mg via ORAL
  Filled 2013-02-07 (×6): qty 2

## 2013-02-07 MED ORDER — METFORMIN HCL ER 500 MG PO TB24
1000.0000 mg | ORAL_TABLET | Freq: Two times a day (BID) | ORAL | Status: DC
  Filled 2013-02-07 (×3): qty 2

## 2013-02-07 MED ORDER — PROPOFOL 10 MG/ML IV BOLUS
INTRAVENOUS | Status: DC | PRN
  Administered 2013-02-07: 200 mg via INTRAVENOUS

## 2013-02-07 MED ORDER — FENTANYL CITRATE 0.05 MG/ML IJ SOLN
INTRAMUSCULAR | Status: DC | PRN
  Administered 2013-02-07: 50 ug via INTRAVENOUS
  Administered 2013-02-07: 150 ug via INTRAVENOUS
  Administered 2013-02-07: 100 ug via INTRAVENOUS

## 2013-02-07 MED ORDER — ROCURONIUM BROMIDE 100 MG/10ML IV SOLN
INTRAVENOUS | Status: DC | PRN
  Administered 2013-02-07: 5 mg via INTRAVENOUS
  Administered 2013-02-07: 10 mg via INTRAVENOUS
  Administered 2013-02-07: 50 mg via INTRAVENOUS
  Administered 2013-02-07 (×2): 10 mg via INTRAVENOUS

## 2013-02-07 MED ORDER — SURGIFOAM 100 EX MISC
CUTANEOUS | Status: DC | PRN
  Administered 2013-02-07: 09:00:00 via TOPICAL

## 2013-02-07 MED ORDER — SODIUM CHLORIDE 0.9 % IV SOLN: 250.0000 mL | INTRAVENOUS | Status: DC

## 2013-02-07 MED ORDER — FENTANYL CITRATE 0.05 MG/ML IJ SOLN
25.0000 ug | INTRAMUSCULAR | Status: DC | PRN
  Administered 2013-02-07 (×3): 25 ug via INTRAVENOUS

## 2013-02-07 MED ORDER — METHOCARBAMOL 100 MG/ML IJ SOLN
500.0000 mg | Freq: Four times a day (QID) | INTRAVENOUS | Status: DC | PRN
  Administered 2013-02-07: 500 mg via INTRAVENOUS
  Filled 2013-02-07: qty 5

## 2013-02-07 MED ORDER — NEOSTIGMINE METHYLSULFATE 1 MG/ML IJ SOLN
INTRAMUSCULAR | Status: DC | PRN
  Administered 2013-02-07: 4 mg via INTRAVENOUS

## 2013-02-07 MED ORDER — 0.9 % SODIUM CHLORIDE (POUR BTL) OPTIME
TOPICAL | Status: DC | PRN
  Administered 2013-02-07: 1000 mL

## 2013-02-07 MED ORDER — BUPIVACAINE HCL (PF) 0.5 % IJ SOLN
INTRAMUSCULAR | Status: DC | PRN
  Administered 2013-02-07: 30 mL

## 2013-02-07 MED ORDER — ACETAMINOPHEN 325 MG PO TABS
650.0000 mg | ORAL_TABLET | ORAL | Status: DC | PRN
  Administered 2013-02-10: 650 mg via ORAL
  Filled 2013-02-07: qty 2

## 2013-02-07 SURGICAL SUPPLY — 66 items
ADH SKN CLS APL DERMABOND .7 (GAUZE/BANDAGES/DRESSINGS)
ADH SKN CLS LQ APL DERMABOND (GAUZE/BANDAGES/DRESSINGS) ×2
APL SKNCLS STERI-STRIP NONHPOA (GAUZE/BANDAGES/DRESSINGS) ×2
BAG DECANTER FOR FLEXI CONT (MISCELLANEOUS) ×2 IMPLANT
BENZOIN TINCTURE PRP APPL 2/3 (GAUZE/BANDAGES/DRESSINGS) ×4 IMPLANT
BLADE SURG ROTATE 9660 (MISCELLANEOUS) IMPLANT
BONE EQUIVA 10CC (Bone Implant) ×1 IMPLANT
BRUSH SCRUB EZ PLAIN DRY (MISCELLANEOUS) ×2 IMPLANT
BUR CUTTER 7.0 ROUND (BURR) ×2 IMPLANT
BUR MATCHSTICK NEURO 3.0 LAGG (BURR) ×2 IMPLANT
CANISTER SUCTION 2500CC (MISCELLANEOUS) ×2 IMPLANT
CLOTH BEACON ORANGE TIMEOUT ST (SAFETY) ×2 IMPLANT
CONT SPEC 4OZ CLIKSEAL STRL BL (MISCELLANEOUS) ×4 IMPLANT
COVER BACK TABLE 24X17X13 BIG (DRAPES) IMPLANT
COVER TABLE BACK 60X90 (DRAPES) ×2 IMPLANT
DERMABOND ADHESIVE PROPEN (GAUZE/BANDAGES/DRESSINGS) ×2
DERMABOND ADVANCED (GAUZE/BANDAGES/DRESSINGS)
DERMABOND ADVANCED .7 DNX12 (GAUZE/BANDAGES/DRESSINGS) ×1 IMPLANT
DERMABOND ADVANCED .7 DNX6 (GAUZE/BANDAGES/DRESSINGS) IMPLANT
DRAPE C-ARM 42X72 X-RAY (DRAPES) ×4 IMPLANT
DRAPE C-ARMOR (DRAPES) ×1 IMPLANT
DRAPE LAPAROTOMY 100X72X124 (DRAPES) ×2 IMPLANT
DRAPE SURG 17X23 STRL (DRAPES) ×4 IMPLANT
DRESSING TELFA 8X3 (GAUZE/BANDAGES/DRESSINGS) ×2 IMPLANT
ELECT CAUTERY BLADE 6.4 (BLADE) ×1 IMPLANT
ELECT REM PT RETURN 9FT ADLT (ELECTROSURGICAL) ×2
ELECTRODE REM PT RTRN 9FT ADLT (ELECTROSURGICAL) ×1 IMPLANT
EVACUATOR 1/8 PVC DRAIN (DRAIN) ×2 IMPLANT
GAUZE SPONGE 4X4 16PLY XRAY LF (GAUZE/BANDAGES/DRESSINGS) IMPLANT
GLOVE BIOGEL PI IND STRL 8.5 (GLOVE) IMPLANT
GLOVE BIOGEL PI INDICATOR 8.5 (GLOVE) ×2
GLOVE ECLIPSE 7.5 STRL STRAW (GLOVE) ×4 IMPLANT
GLOVE ECLIPSE 8.5 STRL (GLOVE) ×1 IMPLANT
GLOVE EXAM NITRILE LRG STRL (GLOVE) IMPLANT
GLOVE EXAM NITRILE MD LF STRL (GLOVE) IMPLANT
GLOVE EXAM NITRILE XS STR PU (GLOVE) IMPLANT
GLOVE SURG SS PI 8.0 STRL IVOR (GLOVE) ×3 IMPLANT
GOWN BRE IMP SLV AUR LG STRL (GOWN DISPOSABLE) ×2 IMPLANT
GOWN BRE IMP SLV AUR XL STRL (GOWN DISPOSABLE) ×3 IMPLANT
GOWN STRL REIN 2XL LVL4 (GOWN DISPOSABLE) ×2 IMPLANT
KIT BASIN OR (CUSTOM PROCEDURE TRAY) ×2 IMPLANT
KIT ROOM TURNOVER OR (KITS) ×2 IMPLANT
LUCENT STRAIGHT 10X22X8 (Set) ×2 IMPLANT
NEEDLE HYPO 22GX1.5 SAFETY (NEEDLE) ×2 IMPLANT
NS IRRIG 1000ML POUR BTL (IV SOLUTION) ×2 IMPLANT
PACK LAMINECTOMY NEURO (CUSTOM PROCEDURE TRAY) ×2 IMPLANT
PAD ARMBOARD 7.5X6 YLW CONV (MISCELLANEOUS) ×10 IMPLANT
PATTIES SURGICAL .75X.75 (GAUZE/BANDAGES/DRESSINGS) IMPLANT
PEDIGUARD CURV (INSTRUMENTS) ×2 IMPLANT
ROD PREBENT 45MM (Rod) ×2 IMPLANT
SCREW SEQUOIA 5.5X40MM (Screw) ×4 IMPLANT
SPONGE GAUZE 4X4 12PLY (GAUZE/BANDAGES/DRESSINGS) ×2 IMPLANT
SPONGE LAP 4X18 X RAY DECT (DISPOSABLE) IMPLANT
SPONGE SURGIFOAM ABS GEL 100 (HEMOSTASIS) ×2 IMPLANT
STRIP CLOSURE SKIN 1/2X4 (GAUZE/BANDAGES/DRESSINGS) ×4 IMPLANT
SUT VIC AB 0 CT1 18XCR BRD8 (SUTURE) ×1 IMPLANT
SUT VIC AB 0 CT1 8-18 (SUTURE) ×2
SUT VIC AB 2-0 OS6 18 (SUTURE) ×7 IMPLANT
SUT VIC AB 3-0 CP2 18 (SUTURE) ×2 IMPLANT
SYR 20ML ECCENTRIC (SYRINGE) ×2 IMPLANT
TOP CLSR SEQUOIA (Orthopedic Implant) ×4 IMPLANT
TOWEL OR 17X24 6PK STRL BLUE (TOWEL DISPOSABLE) ×2 IMPLANT
TOWEL OR 17X26 10 PK STRL BLUE (TOWEL DISPOSABLE) ×2 IMPLANT
TRAP SPECIMEN MUCOUS 40CC (MISCELLANEOUS) ×2 IMPLANT
TRAY FOLEY CATH 14FRSI W/METER (CATHETERS) ×2 IMPLANT
WATER STERILE IRR 1000ML POUR (IV SOLUTION) ×2 IMPLANT

## 2013-02-07 NOTE — Progress Notes (Signed)
Lunch relief by MA Lawonda Pretlow RN 

## 2013-02-07 NOTE — Anesthesia Procedure Notes (Signed)
Procedure Name: Intubation Date/Time: 02/07/2013 7:47 AM Performed by: Orvilla Fus A Pre-anesthesia Checklist: Patient identified, Timeout performed, Emergency Drugs available, Suction available and Patient being monitored Patient Re-evaluated:Patient Re-evaluated prior to inductionOxygen Delivery Method: Circle system utilized Preoxygenation: Pre-oxygenation with 100% oxygen Intubation Type: IV induction Ventilation: Mask ventilation without difficulty and Oral airway inserted - appropriate to patient size Laryngoscope Size: Hyacinth Meeker and 2 Grade View: Grade II Tube type: Oral Tube size: 7.0 mm Number of attempts: 1 Airway Equipment and Method: Stylet and LTA kit utilized Placement Confirmation: ETT inserted through vocal cords under direct vision,  breath sounds checked- equal and bilateral and positive ETCO2 Secured at: 22 cm Tube secured with: Tape Dental Injury: Teeth and Oropharynx as per pre-operative assessment

## 2013-02-07 NOTE — Anesthesia Preprocedure Evaluation (Addendum)
Anesthesia Evaluation  Patient identified by MRN, date of birth, ID band Patient awake    Reviewed: Allergy & Precautions, H&P , NPO status , Patient's Chart, lab work & pertinent test results  History of Anesthesia Complications (+) PONV  Airway Mallampati: II      Dental   Pulmonary shortness of breath, asthma , sleep apnea , pneumonia -, resolved, COPD COPD inhaler,  breath sounds clear to auscultation        Cardiovascular negative cardio ROS  Rhythm:Regular Rate:Normal     Neuro/Psych    GI/Hepatic Neg liver ROS, hiatal hernia, GERD-  ,  Endo/Other  diabetesHypothyroidism   Renal/GU negative Renal ROS     Musculoskeletal   Abdominal   Peds  Hematology   Anesthesia Other Findings   Reproductive/Obstetrics                           Anesthesia Physical Anesthesia Plan  ASA: III  Anesthesia Plan: General   Post-op Pain Management:    Induction: Intravenous  Airway Management Planned: Oral ETT  Additional Equipment:   Intra-op Plan:   Post-operative Plan: Possible Post-op intubation/ventilation  Informed Consent: I have reviewed the patients History and Physical, chart, labs and discussed the procedure including the risks, benefits and alternatives for the proposed anesthesia with the patient or authorized representative who has indicated his/her understanding and acceptance.   Dental advisory given  Plan Discussed with: CRNA, Anesthesiologist and Surgeon  Anesthesia Plan Comments:         Anesthesia Quick Evaluation

## 2013-02-07 NOTE — H&P (Signed)
Alexis Rios is an 67 y.o. female.   Chief Complaint: Back and bilateral leg pain HPI: The patient is a 67 year old female who had a lumbar fusion at L4-5 and L5-S1 at a different hospital approximately 8 years ago. She did fairly well after surgery but recently has developed back and bilateral leg pain more so on the left than the right. She was tried on aggressive conservative therapy without improvement. She underwent imaging studies which showed disease at L2-3 with stenosis and retrolisthesis. After failing further conservative therapy she requested surgery now comes for an L2-3 interbody fusion with pedicle screw fixation. I had a long discussion with her regarding the risks and benefits of surgical intervention. The risks discussed include but are not limited to bleeding infection weakness numbness paralysis trouble with instrumentation nonunion coma and death. We have discussed alternative methods of therapy offered risks and benefits of nonintervention. She has had the opportunity to ask numerous questions and appears to understand. With this information in hand she has requested we proceed with surgery.  Past Medical History  Diagnosis Date  . Diabetes mellitus   . Asthma   . Chronic back pain   . Hypercholesteremia   . Seasonal allergies   . S/P cardiac catheterization     LHC 03/2008: Mid AV groove circumflex 30%, mid RCA 20%, EF 60%  . H/O echocardiogram     Echo 09/2011: Mild LVH, EF 60-65%, grade 1 diastolic dysfunction, trivial AI, mild MR, mild LAE.  . H/O exercise stress test     Dobutamine Myoview 09/2011: No scar or ischemia, no EKG changes, study not gated.  . Skin cancer     scc/bcc  . Anxiety   . Arthritis   . COPD (chronic obstructive pulmonary disease)   . Depression   . History of gallstones   . IBS (irritable bowel syndrome)   . History of pneumonia   . Carpal tunnel syndrome   . Diverticulosis   . GERD (gastroesophageal reflux disease)   . Subclinical  hypothyroidism   . Peripheral neuropathy   . Osteoporosis   . Gastroparesis     ?  . HH (hiatus hernia)   . Complication of anesthesia     if no breathing tx before anesthesia wakes with asthma  . Pneumonia   . Sleep apnea     cpap wwith O2 43yrs  . UTI (urinary tract infection)   . Stones in the urinary tract   . Chronic headaches     migraines    Past Surgical History  Procedure Laterality Date  . Cholecystectomy    . Tonsillectomy    . Shoulder surg      x2 R  . Back surgery      x3  . Knee surgery      R  . Foot surgery      R  . Wrist surgery      L  . Appendectomy    . Abdominal hysterectomy      partial  . Breast surgery      lump x 2 R  . Toe amputation      right  . Umbilical hernia repair    . Nose surgery      skin cancer excision    Family History  Problem Relation Age of Onset  . Allergies Daughter   . Liver cancer Brother   . Leukemia Brother   . Emphysema Brother   . Diabetes Other     all brothers  and sister x 5  . Heart disease Mother   . Heart disease Father   . Stroke Father    Social History:  reports that she has never smoked. She has never used smokeless tobacco. She reports that she does not drink alcohol or use illicit drugs.  Allergies:  Allergies  Allergen Reactions  . Macrodantin (Nitrofurantoin Macrocrystal)   . Sulfa Antibiotics Swelling  . Codeine Itching  . Cymbalta (Duloxetine Hcl) Other (See Comments)    Cannot urinate  . Keflet (Cephalexin Monohydrate) Itching  . Tape Rash    Medications Prior to Admission  Medication Sig Dispense Refill  . albuterol (PROVENTIL HFA;VENTOLIN HFA) 108 (90 BASE) MCG/ACT inhaler Inhale 2 puffs into the lungs every 6 (six) hours as needed. For shortness of breath       . albuterol (PROVENTIL) (2.5 MG/3ML) 0.083% nebulizer solution Take 2.5 mg by nebulization every 6 (six) hours as needed for shortness of breath.       . ALPRAZolam (XANAX) 0.5 MG tablet Take 0.5 mg by mouth 2 (two)  times daily as needed for anxiety.       . citalopram (CELEXA) 40 MG tablet Take 40 mg by mouth daily.       . diphenoxylate-atropine (LOMOTIL) 2.5-0.025 MG per tablet Take 1 tablet by mouth daily as needed for diarrhea or loose stools.  30 tablet  0  . DULERA 200-5 MCG/ACT AERO Take 2 puffs by mouth 2 (two) times daily.       . fluticasone (FLONASE) 50 MCG/ACT nasal spray Place 2 sprays into the nose daily as needed for allergies.       Marland Kitchen gabapentin (NEURONTIN) 300 MG capsule Take 300 mg by mouth 3 (three) times daily.        Marland Kitchen HYDROcodone-acetaminophen (NORCO) 7.5-325 MG per tablet Take 1-2 tablets by mouth every 6 (six) hours as needed for pain.       . metFORMIN (GLUCOPHAGE-XR) 500 MG 24 hr tablet Take 2 tablets by mouth Twice daily.      Marland Kitchen topiramate (TOPAMAX) 25 MG capsule Take 50 mg by mouth at bedtime.       . TRAMADOL-ACETAMINOPHEN PO Take 1-2 tablets by mouth every 4 (four) hours as needed (for pain).         Results for orders placed during the hospital encounter of 02/07/13 (from the past 48 hour(s))  GLUCOSE, CAPILLARY     Status: Abnormal   Collection Time    02/07/13  6:25 AM      Result Value Range   Glucose-Capillary 103 (*) 70 - 99 mg/dL   No results found.  Review of systems not obtained due to patient factors.  Blood pressure 106/67, pulse 59, temperature 97 F (36.1 C), temperature source Oral, resp. rate 18, SpO2 97.00%.  The patient is awake alert and oriented. His no facial asymmetry. Her gait is slow and somewhat antalgic. Her reflexes are decreased but equal. 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 The plan is for an L2-3 interbody fusion with pedicle screw fixation.  Reinaldo Meeker, MD 02/07/2013, 7:36 AM

## 2013-02-07 NOTE — Op Note (Signed)
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 Lateral recess stenosis with L2 and L3 nerve root compression bilaterally  Postop diagnosis: Same Procedure: Bilateral L2-3 decompressive laminectomy for relief of central spinal stenosis Lateral recess decompression with L2 and L3 nerve root decompression more so than needed for interbody fusion Bilateral microdiscectomy L2-3  Posterior lumbar interbody fusion L2-3 with peek interbody spacers Nonsegmental instrumentation L2-3 with [place] pedicle screw system L2-3 posterolateral fusion  Surgeon: Insurance risk surveyor: Pool  After being placed the prone position the patient's back was prepped and draped in usual sterile fashion. Localizing fluoroscopy was used prior to incision to identify the appropriate level. Midline incision was made above the spinous processes of L1-L2 and L3. Using Bovie cutting current the incision was carried on the spinous processes of L1 and L2. The spinous process of L3 had been removed. We then did a subperiosteal dissection on the spinous processes lamina facet joint and the far lateral region to identify the transverse processes of L2 and L3. Self-retaining tract was placed for exposure and x-ray showed approach the appropriate level. Using the [place] rongeur spinous process of L2 was removed. Starting on the patient's left side generous laminotomy was performed removing the inferior 80% of the L2 lamina the medial three quarters of the L2-3 facet joint and the superior one third of the L3 lamina. Residual bone was removed and saved for use later in the case and ligamentum flavum was removed in a piecemeal fashion. L2 and L3 nerve roots were decompressed the lateral recess more so than needed for her body fusion. Summary compression was then carried on the opposite side once again decompress the L2 and L3 nerve roots and lateral recess. The midline structures were then removed to complete the decompression and  relieve the midline stenosis. We then performed a bilateral microdiscectomy at L2-3 and prepared the disc for interbody fusion. He was distracted up to an 8 mm size we felt that this was a good fit. We rotating cutter and then placed a peek interbody spacer filled with a mixture of autologous bone morselized allograft. On the opposite side we did the same thing.  Placed a second spacer we placed a mixture of autologous bone morselized allograft it within the interspace to help with interbody fusion. We then placed pedicle screws in standard fashion. We used drill hole entry points we help localize this with AP fluoroscopy. We then passed the ultrasound all through the pedicle and followed in AP lateral fluoroscopy the excellent position. We then tapped with a 4.5 mm tap and placed 5.5 x 40 mm screws at L2 and L3 bilaterally until they were followed in excellent position. We then irrigated copiously decorticated the far lateral region and performed a posterior lateral fusion with a mixture of autologous bone morselized allograft. We then chose appropriate length rod and secured them to the top of the screws and did tightening and final tightening with torque and counter torque. Final fluoroscopy in AP lateral direction looked excellent. We irrigated once more controlled any bleeding with upper coagulation and Gelfoam. Left an epidural drain in the epidural space and brought out through separate stab wound incision. The was then closed in multiple layers of Vicryl on the muscle fascia subcutaneous and subcuticular tissues. Dermabond and Steri-Strips were placed on the skin. Shortness was then applied the patient was extubated and taken to recovery room in stable condition.

## 2013-02-07 NOTE — Progress Notes (Signed)
UR COMPLETED  

## 2013-02-07 NOTE — Anesthesia Postprocedure Evaluation (Signed)
  Anesthesia Post-op Note  Patient: Alexis Rios  Procedure(s) Performed: Procedure(s) with comments: Lumbar Two-Three Posterior Lumbar Interbody Fusion with Pedicle Screws (N/A) - C-Arm  Patient Location: PACU  Anesthesia Type:General  Level of Consciousness: awake  Airway and Oxygen Therapy: Patient Spontanous Breathing  Post-op Pain: mild  Post-op Assessment: Post-op Vital signs reviewed  Post-op Vital Signs: Reviewed  Complications: No apparent anesthesia complications

## 2013-02-07 NOTE — Preoperative (Signed)
Beta Blockers   Reason not to administer Beta Blockers:Not Applicable 

## 2013-02-07 NOTE — Progress Notes (Signed)
ANTIBIOTIC CONSULT NOTE - INITIAL  Pharmacy Consult for Vanco Indication: Post-op spinal surgery  Allergies  Allergen Reactions  . Macrodantin (Nitrofurantoin Macrocrystal)   . Sulfa Antibiotics Swelling  . Codeine Itching  . Cymbalta (Duloxetine Hcl) Other (See Comments)    Cannot urinate  . Keflet (Cephalexin Monohydrate) Itching  . Tape Rash    Patient Measurements: 96.5 kg   Adjusted Body Weight:   Vital Signs: Temp: 98.5 F (36.9 C) (02/28 1255) Temp src: Oral (02/28 0621) BP: 99/46 mmHg (02/28 1332) Pulse Rate: 65 (02/28 1332) Intake/Output from previous day:   Intake/Output from this shift: Total I/O In: 2000 [I.V.:2000] Out: 595 [Urine:395; Blood:200]  Labs: No results found for this basename: WBC, HGB, PLT, LABCREA, CREATININE,  in the last 72 hours The CrCl is unknown because both a height and weight (above a minimum accepted value) are required for this calculation. No results found for this basename: VANCOTROUGH, VANCOPEAK, VANCORANDOM, GENTTROUGH, GENTPEAK, GENTRANDOM, TOBRATROUGH, TOBRAPEAK, TOBRARND, AMIKACINPEAK, AMIKACINTROU, AMIKACIN,  in the last 72 hours   Microbiology: No results found for this or any previous visit (from the past 720 hour(s)).  Medical History: Past Medical History  Diagnosis Date  . Diabetes mellitus   . Asthma   . Chronic back pain   . Hypercholesteremia   . Seasonal allergies   . S/P cardiac catheterization     LHC 03/2008: Mid AV groove circumflex 30%, mid RCA 20%, EF 60%  . H/O echocardiogram     Echo 09/2011: Mild LVH, EF 60-65%, grade 1 diastolic dysfunction, trivial AI, mild MR, mild LAE.  . H/O exercise stress test     Dobutamine Myoview 09/2011: No scar or ischemia, no EKG changes, study not gated.  . Skin cancer     scc/bcc  . Anxiety   . Arthritis   . COPD (chronic obstructive pulmonary disease)   . Depression   . History of gallstones   . IBS (irritable bowel syndrome)   . History of pneumonia   .  Carpal tunnel syndrome   . Diverticulosis   . GERD (gastroesophageal reflux disease)   . Subclinical hypothyroidism   . Peripheral neuropathy   . Osteoporosis   . Gastroparesis     ?  . HH (hiatus hernia)   . Complication of anesthesia     if no breathing tx before anesthesia wakes with asthma  . Pneumonia   . Sleep apnea     cpap wwith O2 65yrs  . UTI (urinary tract infection)   . Stones in the urinary tract   . Chronic headaches     migraines    Medications:  Prescriptions prior to admission  Medication Sig Dispense Refill  . albuterol (PROVENTIL HFA;VENTOLIN HFA) 108 (90 BASE) MCG/ACT inhaler Inhale 2 puffs into the lungs every 6 (six) hours as needed. For shortness of breath       . albuterol (PROVENTIL) (2.5 MG/3ML) 0.083% nebulizer solution Take 2.5 mg by nebulization every 6 (six) hours as needed for shortness of breath.       . ALPRAZolam (XANAX) 0.5 MG tablet Take 0.5 mg by mouth 2 (two) times daily as needed for anxiety.       . citalopram (CELEXA) 40 MG tablet Take 40 mg by mouth daily.       . diphenoxylate-atropine (LOMOTIL) 2.5-0.025 MG per tablet Take 1 tablet by mouth daily as needed for diarrhea or loose stools.  30 tablet  0  . DULERA 200-5 MCG/ACT AERO Take 2 puffs  by mouth 2 (two) times daily.       . fluticasone (FLONASE) 50 MCG/ACT nasal spray Place 2 sprays into the nose daily as needed for allergies.       Marland Kitchen gabapentin (NEURONTIN) 300 MG capsule Take 300 mg by mouth 3 (three) times daily.        Marland Kitchen HYDROcodone-acetaminophen (NORCO) 7.5-325 MG per tablet Take 1-2 tablets by mouth every 6 (six) hours as needed for pain.       . metFORMIN (GLUCOPHAGE-XR) 500 MG 24 hr tablet Take 2 tablets by mouth Twice daily.      Marland Kitchen topiramate (TOPAMAX) 25 MG capsule Take 50 mg by mouth at bedtime.       . TRAMADOL-ACETAMINOPHEN PO Take 1-2 tablets by mouth every 4 (four) hours as needed (for pain).        Assessment: Post-op spinal surgery with residual epidural drain.   Currently afebrile. WBC 6. Scr 0.72 with estimated CrCl >100.   Goal of Therapy:  Vancomycin trough level 10-15 mcg/ml  Plan:  Vanco 1g IV q8 hrs until drain removed  Merilynn Finland, [employer] 02/07/2013,1:45 PM

## 2013-02-07 NOTE — Transfer of Care (Signed)
Immediate Anesthesia Transfer of Care Note  Patient: Alexis Rios  Procedure(s) Performed: Procedure(s) with comments: Lumbar Two-Three Posterior Lumbar Interbody Fusion with Pedicle Screws (N/A) - C-Arm  Patient Location: PACU  Anesthesia Type:General  Level of Consciousness: awake, alert  and patient cooperative  Airway & Oxygen Therapy: Patient Spontanous Breathing and Patient connected to face mask oxygen  Post-op Assessment: Report given to PACU RN, Post -op Vital signs reviewed and stable and Patient moving all extremities  Post vital signs: Reviewed and stable  Complications: No apparent anesthesia complications

## 2013-02-08 LAB — GLUCOSE, CAPILLARY
Glucose-Capillary: 94 mg/dL (ref 70–99)
Glucose-Capillary: 87 mg/dL (ref 70–99)
Glucose-Capillary: 109 mg/dL — ABNORMAL HIGH (ref 70–99)

## 2013-02-08 MED ORDER — VANCOMYCIN HCL IN DEXTROSE 1-5 GM/200ML-% IV SOLN
1000.0000 mg | Freq: Two times a day (BID) | INTRAVENOUS | Status: DC
  Administered 2013-02-08 – 2013-02-10 (×4): 1000 mg via INTRAVENOUS
  Filled 2013-02-08 (×5): qty 200

## 2013-02-08 MED ORDER — SODIUM CHLORIDE 0.9 % IV BOLUS (SEPSIS)
500.0000 mL | Freq: Once | INTRAVENOUS | Status: AC
  Administered 2013-02-08: 500 mL via INTRAVENOUS

## 2013-02-08 NOTE — Progress Notes (Signed)
Occupational Therapy Evaluation Patient Details Name: Alexis Rios MRN: [id number] DOB: Mar 16, 1946 Today's Date: 02/08/2013 Time: 4098-1191 OT Time Calculation (min): 18 min  OT Assessment / Plan / Recommendation Clinical Impression  67 yo s/p PLIF. PTA, pt independent with self care. Pt has had recent falls. Pt will benefit from skilled OT services to max indpendence with ADL and mobility to facilitate safe D/C home with 24/7 S of husband. Pt should be able to D/C home tomorrow after therapy.    OT Assessment  Patient needs continued OT Services    Follow Up Recommendations  Home health OT    Barriers to Discharge None    Equipment Recommendations  None recommended by OT    Recommendations for Other Services    Frequency  Min 2X/week    Precautions / Restrictions Precautions Precautions: Back;Fall Precaution Booklet Issued: Yes (comment) Precaution Comments: Reviewed back precautions/information with patient. Required Braces or Orthoses: Spinal Brace Spinal Brace: Lumbar corset;Applied in sitting position Restrictions Weight Bearing Restrictions: No   Pertinent Vitals/Pain no apparent distress     ADL  Grooming: Supervision/safety;Set up Where Assessed - Grooming: Supported sitting Upper Body Bathing: Supervision/safety;Set up Where Assessed - Upper Body Bathing: Supported sitting Lower Body Bathing: Maximal assistance Where Assessed - Lower Body Bathing: Supported sit to stand Upper Body Dressing: Set up;Supervision/safety Where Assessed - Upper Body Dressing: Supported sitting Lower Body Dressing: Maximal assistance Where Assessed - Lower Body Dressing: Supported sit to Pharmacist, hospital: Minimal Dentist Method: Sit to Barista: Bedside commode Toileting - Clothing Manipulation and Hygiene: Moderate assistance Where Assessed - Toileting Clothing Manipulation and Hygiene: Sit to stand from 3-in-1 or  toilet Equipment Used: Gait belt Transfers/Ambulation Related to ADLs: [place]inguard ADL Comments: will benefit from education on AE    OT Diagnosis: Generalized weakness;Acute pain  OT Problem List: Decreased strength;Decreased activity tolerance;Decreased knowledge of use of DME or AE;Decreased knowledge of precautions;Obesity;Pain OT Treatment Interventions: Self-care/ADL training;Energy conservation;DME and/or AE instruction;Therapeutic activities;Patient/family education   OT Goals Acute Rehab OT Goals OT Goal Formulation: With patient Time For Goal Achievement: 02/22/13 Potential to Achieve Goals: Good ADL Goals Pt Will Perform Lower Body Bathing: with min assist;with caregiver independent in assisting;Unsupported;with adaptive equipment;with cueing (comment type and amount) ADL Goal: Lower Body Bathing - Progress: Goal set today Pt Will Perform Lower Body Dressing: with min assist;with caregiver independent in assisting;Sit to stand from bed;Unsupported;with adaptive equipment;with cueing (comment type and amount) ADL Goal: Lower Body Dressing - Progress: Goal set today Pt Will Transfer to Toilet: with modified independence;Ambulation;with DME;3-in-1;Maintaining back safety precautions ADL Goal: Toilet Transfer - Progress: Goal set today Pt Will Perform Toileting - Clothing Manipulation: with supervision;Standing;with cueing (comment type and amount) ADL Goal: Toileting - Clothing Manipulation - Progress: Goal set today Pt Will Perform Toileting - Hygiene: with supervision;Sit to stand from 3-in-1/toilet;with cueing (comment type and amount);with adaptive equipment ADL Goal: Toileting - Hygiene - Progress: Goal set today Additional ADL Goal #1: Pt will verbalize 3/3 back precautions independently ADL Goal: Additional Goal #1 - Progress: Goal set today  Visit Information  Last OT Received On: 02/08/13 Assistance Needed: +1    Subjective Data      Prior Functioning     Home  Living Lives With: Spouse Available Help at Discharge: Family;Available 24 hours/day Type of Home: House Home Access: Stairs to enter [employer] of Steps: 1 Entrance Stairs-Rails: None Home Layout: One level Firefighter: Standard Bathroom Accessibility: Yes How Accessible:  Accessible via walker Home Adaptive Equipment: Walker - rolling;Straight cane;Wheelchair - manual;Bedside commode/3-in-1;Shower chair with back Prior Function Level of Independence: Independent with assistive device(s);Needs assistance Needs Assistance: Meal Prep;Light Housekeeping Meal Prep: Moderate Light Housekeeping: Maximal Able to Take Stairs?: Yes Driving: Yes (Not since November due to back pain) Vocation: Retired Musician: No difficulties         Vision/Perception     Copywriter, advertising Overall Cognitive Status: Appears within functional limits for tasks assessed/performed    Extremity/Trunk Assessment Right Upper Extremity Assessment RUE ROM/Strength/Tone: [facility] for tasks assessed Left Upper Extremity Assessment LUE ROM/Strength/Tone: WFL for tasks assessed Right Lower Extremity Assessment RLE ROM/Strength/Tone: [facility] for tasks assessed Left Lower Extremity Assessment LLE ROM/Strength/Tone: [facility] for tasks assessed Trunk Assessment Trunk Assessment: Other exceptions (PLIF)     Mobility Bed Mobility Bed Mobility: Sit to Supine;Sit to Sidelying Right Sit to Sidelying Right: 3: Mod assist;HOB flat Details for Bed Mobility Assistance: A to lift both legs back onto bed Transfers Transfers: Sit to Stand;Stand to Sit Sit to Stand: 4: Min guard Stand to Sit: 4: Min guard     Exercise     Balance  S   End of Session OT - End of Session Equipment Utilized During Treatment: Gait belt Activity Tolerance: Patient tolerated treatment well Patient left: in bed;with call bell/phone within reach;with family/visitor present Nurse Communication: Mobility  status;Precautions  GO     [facility] 02/08/2013, 6:39 PM [facility] [place], OTR/L  [phone] 02/08/2013

## 2013-02-08 NOTE — Evaluation (Signed)
Physical Therapy Evaluation Patient Details Name: Alexis Rios MRN: [id number] DOB: March 22, 1946 Today's Date: 02/08/2013 Time: 1478-2956 PT Time Calculation (min): 27 min  PT Assessment / Plan / Recommendation Clinical Impression  Patient is a 67 yo female s/p L2-3 fusion.  Patient will benefit from acute PT to maximize independence prior to return home with husband.  Recommend HHPT at discharge for continued therapy.    PT Assessment  Patient needs continued PT services    Follow Up Recommendations  Home health PT;Supervision/Assistance - 24 hour    Does the patient have the potential to tolerate intense rehabilitation      Barriers to Discharge        Equipment Recommendations  None recommended by PT    Recommendations for Other Services     Frequency Min 5X/week    Precautions / Restrictions Precautions Precautions: Back;Fall Precaution Booklet Issued: Yes (comment) Precaution Comments: Reviewed back precautions/information with patient. Required Braces or Orthoses: Spinal Brace Spinal Brace: Lumbar corset;Applied in sitting position Restrictions Weight Bearing Restrictions: No   Pertinent Vitals/Pain       Mobility  Bed Mobility Bed Mobility: Not assessed Transfers Transfers: Sit to Stand;Stand to Sit Sit to Stand: 4: Min guard;With upper extremity assist;With armrests;From chair/3-in-1;From toilet Stand to Sit: 4: Min guard;With upper extremity assist;With armrests;To chair/3-in-1;To toilet Details for Transfer Assistance: Verbal cues for hand placement and technique. Ambulation/Gait Ambulation/Gait Assistance: 4: Min assist Ambulation Distance (Feet): 120 Feet Assistive device: 1 person hand held assist Ambulation/Gait Assistance Details: Verbal cues to look up during gait.  Assist for balance. Gait Pattern: Step-through pattern;Decreased stride length;Trunk flexed Gait velocity: Slow gait speed    Exercises General Exercises - Lower Extremity Ankle  Circles/Pumps: AROM;Both;15 reps;Seated   PT Diagnosis: Difficulty walking;Abnormality of gait;Acute pain  PT Problem List: Decreased activity tolerance;Decreased balance;Decreased mobility;Decreased knowledge of use of DME;Decreased knowledge of precautions;Pain PT Treatment Interventions: DME instruction;Gait training;Stair training;Functional mobility training;Patient/family education   PT Goals Acute Rehab PT Goals PT Goal Formulation: With patient Time For Goal Achievement: 02/15/13 Potential to Achieve Goals: Good Pt will Roll Supine to Right Side: Independently PT Goal: Rolling Supine to Right Side - Progress: Goal set today Pt will Roll Supine to Left Side: Independently PT Goal: Rolling Supine to Left Side - Progress: Goal set today Pt will go Supine/Side to Sit: with supervision;with HOB 0 degrees PT Goal: Supine/Side to Sit - Progress: Goal set today Pt will go Sit to Stand: with supervision;with upper extremity assist PT Goal: Sit to Stand - Progress: Goal set today Pt will Ambulate: >150 feet;with supervision;with rolling walker PT Goal: Ambulate - Progress: Goal set today Pt will Go Up / Down Stairs: 1-2 stairs;with supervision;with least restrictive assistive device PT Goal: Up/Down Stairs - Progress: Goal set today  Visit Information  Last PT Received On: 02/08/13 Assistance Needed: +1    Subjective Data  Subjective: "My back has been giving me problems since before July."  Patient reports multiple falls. Patient Stated Goal: To decrease pain.  To go home.   Prior Functioning  Home Living Lives With: Spouse Available Help at Discharge: Family;Available 24 hours/day Type of Home: House Home Access: Stairs to enter [employer] of Steps: 1 Entrance Stairs-Rails: None Home Layout: One level Firefighter: Standard Bathroom Accessibility: Yes How Accessible: Accessible via walker Home Adaptive Equipment: Walker - rolling;Straight cane;Wheelchair -  manual;Bedside commode/3-in-1;Shower chair with back Prior Function Level of Independence: Independent with assistive device(s);Needs assistance Needs Assistance: Meal Prep;Light Housekeeping Meal  Prep: Moderate Light Housekeeping: Maximal Able to Take Stairs?: Yes Driving: Yes (Not since November due to back pain) Vocation: Retired Musician: No difficulties    Copywriter, advertising Overall Cognitive Status: Appears within functional limits for tasks assessed/performed Arousal/Alertness: Awake/alert Orientation Level: Appears intact for tasks assessed Behavior During Session: [facility] for tasks performed    Extremity/Trunk Assessment Right Upper Extremity Assessment RUE ROM/Strength/Tone: WFL for tasks assessed RUE Sensation: WFL - Light Touch Left Upper Extremity Assessment LUE ROM/Strength/Tone: WFL for tasks assessed LUE Sensation: WFL - Light Touch Right Lower Extremity Assessment RLE ROM/Strength/Tone: WFL for tasks assessed RLE Sensation: WFL - Light Touch Left Lower Extremity Assessment LLE ROM/Strength/Tone: WFL for tasks assessed LLE Sensation: WFL - Light Touch   Balance    End of Session PT - End of Session Equipment Utilized During Treatment: Gait belt;Back brace Activity Tolerance: Patient limited by fatigue;Patient limited by pain Patient left: in chair;with call bell/phone within reach;with family/visitor present Nurse Communication: Mobility status  GP     Vena Austria 02/08/2013, 12:55 PM Durenda Hurt. Renaldo Fiddler, [facility] Acute Rehab Services Pager [phone]

## 2013-02-08 NOTE — Progress Notes (Signed)
Patient ID: Alexis Rios, female   DOB: 28-Feb-1946, 67 y.o.   MRN: [id number] BP 102/58  Pulse 71  Temp(Src) 98.4 F (36.9 C) (Oral)  Resp 18  Ht 5' (1.524 m)  Wt 96.5 kg (212 lb 11.9 oz)  BMI 41.55 kg/m2  SpO2 98% Alert and oriented x 4 Moving lower extremities well, strength is normal Dressing dry, in place Drain with significant drain 225 overnight. Will leave in today Receiving last dose of vancomycin Blood pressure low overnight will leave IV in today.

## 2013-02-08 NOTE — H&P (Addendum)
Patient worried about her blood pressure being low all day.  Called Dr. Jordan Likes who gave order for 500 cc bolus of NS.  Infusing now and will continue to monitor patient.  Lance Bosch RN

## 2013-02-09 LAB — GLUCOSE, CAPILLARY: Glucose-Capillary: 111 mg/dL — ABNORMAL HIGH (ref 70–99)

## 2013-02-09 MED ORDER — POLYETHYLENE GLYCOL 3350 17 G PO PACK
17.0000 g | PACK | Freq: Every day | ORAL | Status: DC
  Administered 2013-02-09: 17 g via ORAL
  Filled 2013-02-09 (×2): qty 1

## 2013-02-09 NOTE — Progress Notes (Signed)
Complains of more pain today. States she is sore all over. No new radicular pain however.  Afebrile. Vital stable. Drain output moderate. Motor 5 over 5 bilaterally. Dressing dry. Chest and abdomen benign.  Status post lumbar decompression and fusion. Progressing slowly. Continue therapy and mobilization.

## 2013-02-09 NOTE — Progress Notes (Signed)
Physical Therapy Treatment Patient Details Name: Alexis Rios MRN: [id number] DOB: 24-May-1946 Today's Date: 02/09/2013 Time: 2956-2130 PT Time Calculation (min): 25 min  PT Assessment / Plan / Recommendation Comments on Treatment Session  Continue per POC.  Pt has one step onto threshold to enter house.  She sould be ready for d/c home tomorrow.    Follow Up Recommendations  Home health PT;Supervision/Assistance - 24 hour     Does the patient have the potential to tolerate intense rehabilitation     Barriers to Discharge        Equipment Recommendations  None recommended by PT    Recommendations for Other Services    Frequency Min 5X/week   Plan Discharge plan remains appropriate;Frequency remains appropriate    Precautions / Restrictions Precautions Precautions: Back Precaution Comments: Reviewed 3/3 back precautions.  Pt independently recalled 2/3. Required Braces or Orthoses: Spinal Brace Spinal Brace: Lumbar corset;Applied in sitting position   Pertinent Vitals/Pain 5/10    Mobility  Bed Mobility Bed Mobility: Rolling Left;Left Sidelying to Sit Rolling Left: 4: Min assist Left Sidelying to Sit: 4: Min assist Details for Bed Mobility Assistance: verbal/tactile cues for logroll Transfers Sit to Stand: 4: Min guard;From bed;With upper extremity assist Stand to Sit: 4: Min guard;To chair/3-in-1;With armrests Details for Transfer Assistance: verbal cues for hand placement Ambulation/Gait Ambulation/Gait Assistance: 5: Supervision Ambulation Distance (Feet): 300 Feet Assistive device: Rolling walker Ambulation/Gait Assistance Details: Pt opted for RW due to increase support, decreased stress on back. Gait Pattern: Step-through pattern    Exercises     PT Diagnosis:    PT Problem List:   PT Treatment Interventions:     PT Goals Acute Rehab PT Goals PT Goal: Rolling Supine to Right Side - Progress: Progressing toward goal PT Goal: Rolling Supine to Left Side  - Progress: Progressing toward goal PT Goal: Supine/Side to Sit - Progress: Progressing toward goal PT Goal: Sit to Stand - Progress: Progressing toward goal PT Goal: Ambulate - Progress: Met PT Goal: Up/Down Stairs - Progress: Progressing toward goal  Visit Information  Last PT Received On: 02/09/13 Assistance Needed: +1    Subjective Data  Subjective: "I'm hurting more today." Patient Stated Goal: home   Cognition  Cognition Overall Cognitive Status: Appears within functional limits for tasks assessed/performed Arousal/Alertness: Awake/alert Orientation Level: Appears intact for tasks assessed Behavior During Session: [facility] for tasks performed    Balance     End of Session PT - End of Session Equipment Utilized During Treatment: Gait belt;Back brace Activity Tolerance: Patient tolerated treatment well Patient left: in chair;with call bell/phone within reach;with family/visitor present Nurse Communication: Mobility status   GP     Ilda Foil 02/09/2013, 12:48 PM  Aida Raider, PT  Office # [phone] Pager [phone]

## 2013-02-10 LAB — GLUCOSE, CAPILLARY

## 2013-02-10 MED ORDER — HYDROCODONE-ACETAMINOPHEN 5-325 MG PO TABS: 1.0000 | ORAL_TABLET | ORAL | Status: DC | PRN

## 2013-02-10 MED ORDER — METHOCARBAMOL 500 MG PO TABS: 500.0000 mg | ORAL_TABLET | Freq: Four times a day (QID) | ORAL | Status: DC | PRN

## 2013-02-10 NOTE — Care Management Note (Signed)
    Page 1 of 1   02/10/2013     2:09:48 PM   CARE MANAGEMENT NOTE 02/10/2013  Patient:  Alexis Rios, Alexis Rios   Account Number:  [id number]  Date Initiated:  02/10/2013  Documentation initiated by:  Jacquelynn Cree  Subjective/Objective Assessment:   admitted postop L2-3 PLIF     Action/Plan:   return home   Anticipated DC Date:  02/10/2013   Anticipated DC Plan:  HOME/SELF CARE      DC Planning Services  CM consult      Choice offered to / List presented to:             Status of service:  Completed, signed off Medicare Important Message given?   (If response is "NO", the following Medicare IM given date fields will be blank) Date Medicare IM given:   Date Additional Medicare IM given:    Discharge Disposition:  HOME/SELF CARE  Per UR Regulation:  Reviewed for med. necessity/level of care/duration of stay  If discussed at Long Length of Stay Meetings, dates discussed:    Comments:

## 2013-02-10 NOTE — Progress Notes (Signed)
Physical Therapy Treatment Patient Details Name: Alexis Rios MRN: [id number] DOB: [birth year] Today's Date: 02/10/2013 Time: 4098-1191 PT Time Calculation (min): 26 min  PT Assessment / Plan / Recommendation Comments on Treatment Session  Pt making steady progress with mobility.  Plans are to d/c home today.      Follow Up Recommendations  Home health PT;Supervision/Assistance - 24 hour     Does the patient have the potential to tolerate intense rehabilitation     Barriers to Discharge        Equipment Recommendations  None recommended by PT    Recommendations for Other Services    Frequency Min 5X/week   Plan Discharge plan remains appropriate;Frequency remains appropriate    Precautions / Restrictions Precautions Precautions: Back Precaution Comments: Pt unable to verbalize back precautions.  Reviewed all 3.   Required Braces or Orthoses: Spinal Brace Spinal Brace: Lumbar corset;Applied in sitting position Restrictions Weight Bearing Restrictions: No       Mobility  Bed Mobility Bed Mobility: Not assessed Transfers Transfers: Sit to Stand;Stand to Sit Sit to Stand: 5: Supervision;With upper extremity assist;From bed;From toilet Stand to Sit: 5: Supervision;With upper extremity assist;With armrests;To bed;To chair/3-in-1 Details for Transfer Assistance: Cues for hand placement Ambulation/Gait Ambulation/Gait Assistance: 5: Supervision Ambulation Distance (Feet): 240 Feet Assistive device: Rolling walker Ambulation/Gait Assistance Details: Pt steady.  Cues to look ahead for increased awareness of environement Gait Pattern: Step-through pattern;Decreased stride length Gait velocity: Slow gait speed Stairs: Yes Stairs Assistance: 4: Min guard Stair Management Technique: Step to pattern;No rails;With walker Number of Stairs: 1 Wheelchair Mobility Wheelchair Mobility: No      PT Goals Acute Rehab PT Goals Time For Goal Achievement: 02/15/13 Potential to  Achieve Goals: Good Pt will Roll Supine to Right Side: Independently Pt will Roll Supine to Left Side: Independently Pt will go Supine/Side to Sit: with supervision;with HOB 0 degrees Pt will go Sit to Stand: with supervision;with upper extremity assist PT Goal: Sit to Stand - Progress: Met Pt will Ambulate: >150 feet;with supervision;with rolling walker PT Goal: Ambulate - Progress: Met Pt will Go Up / Down Stairs: 1-2 stairs;with supervision;with least restrictive assistive device PT Goal: Up/Down Stairs - Progress: Progressing toward goal  Visit Information  Last PT Received On: 02/10/13 Assistance Needed: +1    Subjective Data      Cognition  Cognition Overall Cognitive Status: Appears within functional limits for tasks assessed/performed Arousal/Alertness: Awake/alert Orientation Level: Appears intact for tasks assessed Behavior During Session: [facility] for tasks performed Cognition - Other Comments: Pt reports feeling a little depressed today.      Balance  Balance Balance Assessed: Yes Static Standing Balance Static Standing - Balance Support: No upper extremity supported Static Standing - Level of Assistance: 5: Stand by assistance (while pulling up pants/skirt)  End of Session PT - End of Session Equipment Utilized During Treatment: Gait belt;Back brace Activity Tolerance: Patient tolerated treatment well Patient left: with call bell/phone within reach;in chair Nurse Communication: Mobility status     Verdell Face, [place] [phone] 02/10/2013

## 2013-02-10 NOTE — Discharge Summary (Signed)
Physician Discharge Summary  Patient ID: Alexis Rios MRN: [id number] DOB/AGE: July 14, 1946 67 y.o.  Admit date: 02/07/2013 Discharge date: 02/10/2013  Admission Diagnoses:  Discharge Diagnoses:  Active Problems:   * No active hospital problems. *   Discharged Condition: good  Hospital Course: Surgery 4 days ago with L 23 plif. No post op issues. Slowly increased activity. Pain much better than pre op. Wound healed fine. Home POD 4, specific instructions given.  Consults: None  Significant Diagnostic Studies: none  Treatments: surgery:  L 23 plif  Discharge Exam: Blood pressure 133/55, pulse 85, temperature 100.9 F (38.3 C), temperature source Oral, resp. rate 22, height 5' (1.524 m), weight 96.5 kg (212 lb 11.9 oz), SpO2 91.00%. Incision/Wound:healing well; neuro stable  Disposition: 01-Home or Self Care  Discharge Orders   Future Orders Complete By Expires     Call MD for:  difficulty breathing, headache or visual disturbances  As directed     Call MD for:  hives  As directed     Call MD for:  persistant nausea and vomiting  As directed     Call MD for:  redness, tenderness, or signs of infection (pain, swelling, redness, odor or green/yellow discharge around incision site)  As directed     Call MD for:  severe uncontrolled pain  As directed     Call MD for:  temperature >100.4  As directed     Diet general  As directed     Discharge instructions  As directed     Comments:      Mostly bedrest. Get up 9 or 10 times each day and walk for 15-20 minutes each time. Very little sitting the first week. No riding in the car until your first post op appointment. If you had neck surgery...may shower from the chest down. If you had low back surgery....you may shower with a saran wrap covering over the incision. Take your pain medicine as needed...and other medicines that you are instructed to take. Call for an appointment...[phone].        Medication List    STOP taking  these medications       diphenoxylate-atropine 2.5-0.025 MG per tablet  Commonly known as:  LOMOTIL     TRAMADOL-ACETAMINOPHEN PO      TAKE these medications       albuterol (2.5 MG/3ML) 0.083% nebulizer solution  Commonly known as:  PROVENTIL  Take 2.5 mg by nebulization every 6 (six) hours as needed for shortness of breath.     albuterol 108 (90 BASE) MCG/ACT inhaler  Commonly known as:  PROVENTIL HFA;VENTOLIN HFA  Inhale 2 puffs into the lungs every 6 (six) hours as needed. For shortness of breath     ALPRAZolam 0.5 MG tablet  Commonly known as:  XANAX  Take 0.5 mg by mouth 2 (two) times daily as needed for anxiety.     citalopram 40 MG tablet  Commonly known as:  CELEXA  Take 40 mg by mouth daily.     DULERA 200-5 MCG/ACT Aero  Generic drug:  mometasone-formoterol  Take 2 puffs by mouth 2 (two) times daily.     fluticasone 50 MCG/ACT nasal spray  Commonly known as:  FLONASE  Place 2 sprays into the nose daily as needed for allergies.     gabapentin 300 MG capsule  Commonly known as:  NEURONTIN  Take 300 mg by mouth 3 (three) times daily.     HYDROcodone-acetaminophen 7.5-325 MG per tablet  Commonly known  as:  NORCO  Take 1-2 tablets by mouth every 6 (six) hours as needed for pain.     HYDROcodone-acetaminophen 5-325 MG per tablet  Commonly known as:  NORCO/VICODIN  Take 1-2 tablets by mouth every 4 (four) hours as needed.     metFORMIN 500 MG 24 hr tablet  Commonly known as:  GLUCOPHAGE-XR  Take 2 tablets by mouth Twice daily.     methocarbamol 500 MG tablet  Commonly known as:  ROBAXIN  Take 1 tablet (500 mg total) by mouth every 6 (six) hours as needed.     topiramate 25 MG capsule  Commonly known as:  TOPAMAX  Take 50 mg by mouth at bedtime.         At home rest most of the time. Get up 9 or 10 times each day and take a 15 or 20 minute walk. No riding in the car and to your first postoperative appointment. If you have neck surgery you may shower  from the chest down starting on the third postoperative day. If you had back surgery he may start showering on the third postoperative day with saran wrap wrapped around your incisional area 3 times. After the shower remove the saran wrap. Take pain medicine as needed and other medications as instructed. Call my office for an appointment.  SignedReinaldo Meeker, MD 02/10/2013, 9:49 AM

## 2013-02-10 NOTE — Progress Notes (Signed)
Occupational Therapy Treatment Patient Details Name: Alexis Rios MRN: [id number] DOB: 06-20-1946 Today's Date: 02/10/2013 Time: 4098-1191 OT Time Calculation (min): 25 min  OT Assessment / Plan / Recommendation Comments on Treatment Session Pt preparing for d/c.  Instructed and practiced dressing using AE.  Verbal instruction in LB bathing.  Husband will assist pt with shower stall transfer at home.  Equipment needs are met.    Follow Up Recommendations  Home health OT    Barriers to Discharge       Equipment Recommendations  None recommended by OT    Recommendations for Other Services    Frequency     Plan Discharge plan remains appropriate    Precautions / Restrictions Precautions Precautions: Back Precaution Comments: Reviewed back precautions pertaining to bathing and dressing. Required Braces or Orthoses: Spinal Brace Spinal Brace: Lumbar corset;Applied in sitting position   Pertinent Vitals/Pain Back pain, did not rate, premedicated    ADL  Lower Body Bathing: Supervision/safety (instructed in use of long sponge) Where Assessed - Lower Body Bathing: Unsupported sitting Upper Body Dressing: Set up (back brace also) Where Assessed - Upper Body Dressing: Unsupported sitting Lower Body Dressing: Supervision/safety Where Assessed - Lower Body Dressing: Unsupported sitting;Supported sit to stand Equipment Used: Back brace;Gait belt;Long-handled sponge;Reacher Transfers/Ambulation Related to ADLs: supervision, verbal cues for hand placement ADL Comments: Pt able to cross her foot over her opposite knee to perform LB ADL.    OT Diagnosis:    OT Problem List:   OT Treatment Interventions:     OT Goals ADL Goals Pt Will Perform Lower Body Bathing: with min assist;with caregiver independent in assisting;Unsupported;with adaptive equipment;with cueing (comment type and amount) ADL Goal: Lower Body Bathing - Progress: Met Pt Will Perform Lower Body Dressing: with min  assist;with caregiver independent in assisting;Sit to stand from bed;Unsupported;with adaptive equipment;with cueing (comment type and amount) ADL Goal: Lower Body Dressing - Progress: Met Pt Will Transfer to Toilet: with modified independence;Ambulation;with DME;3-in-1;Maintaining back safety precautions Pt Will Perform Toileting - Clothing Manipulation: with supervision;Standing;with cueing (comment type and amount) Pt Will Perform Toileting - Hygiene: with supervision;Sit to stand from 3-in-1/toilet;with cueing (comment type and amount);with adaptive equipment Additional ADL Goal #1: Pt will verbalize 3/3 back precautions independently ADL Goal: Additional Goal #1 - Progress: Progressing toward goals  Visit Information  Last OT Received On: 02/10/13 Assistance Needed: +1    Subjective Data      Prior Functioning       Cognition  Cognition Overall Cognitive Status: Appears within functional limits for tasks assessed/performed Arousal/Alertness: Awake/alert Orientation Level: Appears intact for tasks assessed Behavior During Session: [facility] for tasks performed    Mobility  Bed Mobility Bed Mobility: Not assessed Transfers Transfers: Sit to Stand;Stand to Sit Sit to Stand: 5: Supervision;With upper extremity assist;From chair/3-in-1 Stand to Sit: 5: Supervision;With upper extremity assist;To chair/3-in-1 Details for Transfer Assistance: verbal cues for hand placement    Exercises      Balance Balance Balance Assessed: Yes Static Standing Balance Static Standing - Balance Support: No upper extremity supported Static Standing - Level of Assistance: 5: Stand by assistance (while pulling up pants/skirt)   End of Session OT - End of Session Activity Tolerance: Patient tolerated treatment well Patient left: in chair;with call bell/phone within reach;with nursing in room  GO     Evern Bio 02/10/2013, 12:00 PM [phone]

## 2013-02-10 NOTE — Progress Notes (Signed)
Patient ready for dc.  Ambulated with PT this am, IV removed, discharge teaching completed, prescriptions sent to patient's pharmacy.  Lance Bosch, RN

## 2013-04-27 ENCOUNTER — Encounter (HOSPITAL_COMMUNITY): Payer: Self-pay | Admitting: Emergency Medicine

## 2013-04-27 ENCOUNTER — Emergency Department (HOSPITAL_COMMUNITY): Payer: Medicare Other

## 2013-04-27 ENCOUNTER — Other Ambulatory Visit: Payer: Self-pay

## 2013-04-27 ENCOUNTER — Emergency Department (HOSPITAL_COMMUNITY)
Admission: EM | Admit: 2013-04-27 | Discharge: 2013-04-27 | Disposition: A | Discharge status: Home / Self Care | Payer: Medicare Other | Attending: Emergency Medicine | Admitting: Emergency Medicine

## 2013-04-27 DIAGNOSIS — E119 Type 2 diabetes mellitus without complications: Secondary | ICD-10-CM | POA: Insufficient documentation

## 2013-04-27 DIAGNOSIS — R079 Chest pain, unspecified: Secondary | ICD-10-CM | POA: Insufficient documentation

## 2013-04-27 DIAGNOSIS — Z79899 Other long term (current) drug therapy: Secondary | ICD-10-CM | POA: Insufficient documentation

## 2013-04-27 DIAGNOSIS — Z8719 Personal history of other diseases of the digestive system: Secondary | ICD-10-CM | POA: Insufficient documentation

## 2013-04-27 DIAGNOSIS — F329 Major depressive disorder, single episode, unspecified: Secondary | ICD-10-CM | POA: Insufficient documentation

## 2013-04-27 DIAGNOSIS — Z85828 Personal history of other malignant neoplasm of skin: Secondary | ICD-10-CM | POA: Insufficient documentation

## 2013-04-27 DIAGNOSIS — Z8669 Personal history of other diseases of the nervous system and sense organs: Secondary | ICD-10-CM | POA: Insufficient documentation

## 2013-04-27 DIAGNOSIS — K219 Gastro-esophageal reflux disease without esophagitis: Secondary | ICD-10-CM | POA: Insufficient documentation

## 2013-04-27 DIAGNOSIS — Z9861 Coronary angioplasty status: Secondary | ICD-10-CM | POA: Insufficient documentation

## 2013-04-27 DIAGNOSIS — Z8701 Personal history of pneumonia (recurrent): Secondary | ICD-10-CM | POA: Insufficient documentation

## 2013-04-27 DIAGNOSIS — Z862 Personal history of diseases of the blood and blood-forming organs and certain disorders involving the immune mechanism: Secondary | ICD-10-CM | POA: Insufficient documentation

## 2013-04-27 DIAGNOSIS — Z8639 Personal history of other endocrine, nutritional and metabolic disease: Secondary | ICD-10-CM | POA: Insufficient documentation

## 2013-04-27 DIAGNOSIS — Z8739 Personal history of other diseases of the musculoskeletal system and connective tissue: Secondary | ICD-10-CM | POA: Insufficient documentation

## 2013-04-27 DIAGNOSIS — F411 Generalized anxiety disorder: Secondary | ICD-10-CM | POA: Insufficient documentation

## 2013-04-27 DIAGNOSIS — R0602 Shortness of breath: Secondary | ICD-10-CM | POA: Insufficient documentation

## 2013-04-27 DIAGNOSIS — Z9089 Acquired absence of other organs: Secondary | ICD-10-CM | POA: Insufficient documentation

## 2013-04-27 DIAGNOSIS — Z87440 Personal history of urinary (tract) infections: Secondary | ICD-10-CM | POA: Insufficient documentation

## 2013-04-27 DIAGNOSIS — J449 Chronic obstructive pulmonary disease, unspecified: Secondary | ICD-10-CM | POA: Insufficient documentation

## 2013-04-27 DIAGNOSIS — F3289 Other specified depressive episodes: Secondary | ICD-10-CM | POA: Insufficient documentation

## 2013-04-27 DIAGNOSIS — J4489 Other specified chronic obstructive pulmonary disease: Secondary | ICD-10-CM | POA: Insufficient documentation

## 2013-04-27 LAB — COMPREHENSIVE METABOLIC PANEL
Albumin: 3.2 g/dL — ABNORMAL LOW (ref 3.5–5.2)
BUN: 9 mg/dL (ref 6–23)
Creatinine, Ser: 1.12 mg/dL — ABNORMAL HIGH (ref 0.50–1.10)
GFR calc Af Amer: 57 mL/min — ABNORMAL LOW (ref >90)
Glucose, Bld: 99 mg/dL (ref 70–99)
Total Protein: 6.4 g/dL (ref 6.0–8.3)

## 2013-04-27 LAB — CBC
HCT: 34.7 % — ABNORMAL LOW (ref 36.0–46.0)
MCH: 28.5 pg (ref 26.0–34.0)
MCHC: 32 g/dL (ref 30.0–36.0)
MCV: 89 fL (ref 78.0–100.0)
RDW: 14.9 % (ref 11.5–15.5)

## 2013-04-27 LAB — URINALYSIS, ROUTINE W REFLEX MICROSCOPIC
Glucose, UA: NEGATIVE mg/dL
Leukocytes, UA: NEGATIVE
Nitrite: NEGATIVE
Protein, ur: NEGATIVE mg/dL
pH: 7 (ref 5.0–8.0)

## 2013-04-27 LAB — TROPONIN I: Troponin I: <0.3 ng/mL (ref <0.30)

## 2013-04-27 MED ORDER — SODIUM CHLORIDE 0.9 % IV SOLN
20.0000 mL | INTRAVENOUS | Status: DC
  Administered 2013-04-27: 20 mL via INTRAVENOUS

## 2013-04-27 MED ORDER — ASPIRIN 81 MG PO CHEW
324.0000 mg | CHEWABLE_TABLET | Freq: Once | ORAL | Status: AC
  Administered 2013-04-27: 324 mg via ORAL
  Filled 2013-04-27: qty 3
  Filled 2013-04-27: qty 1

## 2013-04-27 NOTE — ED Notes (Signed)
Patient remains pain-free since arrival to ED.

## 2013-04-27 NOTE — ED Provider Notes (Signed)
CSN: [id number]  Arrival date & time 04/27/13  1738   First MD Initiated Contact with Patient 04/27/13 1753      Chief Complaint  Patient presents with  . Abdominal Pain  . Shortness of Breath    (Consider location/radiation/quality/duration/timing/severity/associated sxs/prior treatment) HPI Comments: Alexis Rios is a 67 y.o. Female who began to have left-sided chest pain, just above the abdomen about one hour ago. The pain migrated to the right lower chest. The pain has since resolved, completely. She did not have any associated nausea, vomiting, diaphoresis, back, pain or shortness of breath. She denies recent fever, chills, change in bowel or urinary habits. She has previously had evaluations for cardiac abnormalities, but none have been found. She does not have a cardiologist. There are no known modifying factors.  Patient is a 67 y.o. female presenting with abdominal pain and shortness of breath. The history is provided by the patient.  Abdominal Pain Associated symptoms: shortness of breath   Shortness of Breath Associated symptoms: abdominal pain     Past Medical History  Diagnosis Date  . Diabetes mellitus   . Asthma   . Chronic back pain   . Hypercholesteremia   . Seasonal allergies   . S/P cardiac catheterization     LHC 03/2008: Mid AV groove circumflex 30%, mid RCA 20%, EF 60%  . H/O echocardiogram     Echo 09/2011: Mild LVH, EF 60-65%, grade 1 diastolic dysfunction, trivial AI, mild MR, mild LAE.  . H/O exercise stress test     Dobutamine Myoview 09/2011: No scar or ischemia, no EKG changes, study not gated.  . Skin cancer     scc/bcc  . Anxiety   . Arthritis   . COPD (chronic obstructive pulmonary disease)   . Depression   . History of gallstones   . IBS (irritable bowel syndrome)   . History of pneumonia   . Carpal tunnel syndrome   . Diverticulosis   . GERD (gastroesophageal reflux disease)   . Subclinical hypothyroidism   . Peripheral  neuropathy   . Osteoporosis   . Gastroparesis     ?  . HH (hiatus hernia)   . Complication of anesthesia     if no breathing tx before anesthesia wakes with asthma  . Pneumonia   . Sleep apnea     cpap wwith O2 57yrs  . UTI (urinary tract infection)   . Stones in the urinary tract   . Chronic headaches     migraines    Past Surgical History  Procedure Laterality Date  . Cholecystectomy    . Tonsillectomy    . Shoulder surg      x2 R  . Back surgery      x3  . Knee surgery      R  . Foot surgery      R  . Wrist surgery      L  . Appendectomy    . Abdominal hysterectomy      partial  . Breast surgery      lump x 2 R  . Toe amputation      right  . Umbilical hernia repair    . Nose surgery      skin cancer excision  . Back surgery      Family History  Problem Relation Age of Onset  . Allergies Daughter   . Liver cancer Brother   . Leukemia Brother   . Emphysema Brother   .  Diabetes Other     all brothers and sister x 5  . Heart disease Mother   . Heart disease Father   . Stroke Father     History  Substance Use Topics  . Smoking status: Never Smoker   . Smokeless tobacco: Never Used  . Alcohol Use: No    OB History   Grav Para Term Preterm Abortions TAB SAB Ect Mult Living                  Review of Systems  Respiratory: Positive for shortness of breath.   Gastrointestinal: Positive for abdominal pain.  All other systems reviewed and are negative.    Allergies  Macrodantin; Sulfa antibiotics; Codeine; Cymbalta; Keflet; and Tape  Home Medications   Current Outpatient Rx  Name  Route  Sig  Dispense  Refill  . albuterol (PROVENTIL HFA;VENTOLIN HFA) 108 (90 BASE) MCG/ACT inhaler   Inhalation   Inhale 2 puffs into the lungs every 6 (six) hours as needed. For shortness of breath          . albuterol (PROVENTIL) (2.5 MG/3ML) 0.083% nebulizer solution   Nebulization   Take 2.5 mg by nebulization every 6 (six) hours as needed for shortness  of breath.          . ALPRAZolam (XANAX) 0.5 MG tablet   Oral   Take 0.5 mg by mouth 2 (two) times daily as needed for anxiety.          Marland Kitchen aspirin-sod bicarb-citric acid (ALKA-SELTZER) 325 MG TBEF   Oral   Take 325 mg by mouth every 6 (six) hours as needed (for pain).         . citalopram (CELEXA) 40 MG tablet   Oral   Take 40 mg by mouth daily.          Elwin Sleight 200-5 MCG/ACT AERO   Oral   Take 2 puffs by mouth 2 (two) times daily.          Marland Kitchen gabapentin (NEURONTIN) 300 MG capsule   Oral   Take 300 mg by mouth 3 (three) times daily.           Marland Kitchen HYDROcodone-acetaminophen (NORCO/VICODIN) 5-325 MG per tablet   Oral   Take 1-2 tablets by mouth every 4 (four) hours as needed for pain.         . metFORMIN (GLUCOPHAGE-XR) 500 MG 24 hr tablet   Oral   Take 2 tablets by mouth Twice daily.         . pantoprazole (PROTONIX) 40 MG tablet   Oral   Take 40 mg by mouth 2 (two) times daily.         Marland Kitchen topiramate (TOPAMAX) 50 MG tablet   Oral   Take 50 mg by mouth at bedtime.         . methocarbamol (ROBAXIN) 500 MG tablet   Oral   Take 1 tablet (500 mg total) by mouth every 6 (six) hours as needed.   40 tablet   1     BP 133/55  Pulse 49  Temp(Src) 98.1 F (36.7 C) (Oral)  Resp 18  Ht 5' (1.524 m)  Wt 200 lb (90.719 kg)  BMI 39.06 kg/m2  SpO2 95%  Physical Exam  Nursing note and vitals reviewed. Constitutional: She is oriented to person, place, and time. She appears well-developed and well-nourished.  HENT:  Head: Normocephalic and atraumatic.  Eyes: Conjunctivae and EOM are normal. Pupils are equal, round,  and reactive to light.  Neck: Normal range of motion and phonation normal. Neck supple.  Cardiovascular: Normal rate, regular rhythm and intact distal pulses.   Pulmonary/Chest: Effort normal and breath sounds normal. No respiratory distress. She has no wheezes. She exhibits no tenderness.  Abdominal: Soft. She exhibits no distension. There is no  tenderness. There is no guarding.  Musculoskeletal: Normal range of motion.  Neurological: She is alert and oriented to person, place, and time. She has normal strength. She exhibits normal muscle tone.  Skin: Skin is warm and dry.  Psychiatric: She has a normal mood and affect. Her behavior is normal. Judgment and thought content normal.    ED Course  Procedures (including critical care time)  Medications  0.9 %  sodium chloride infusion (20 mLs Intravenous New Bag/Given 04/27/13 1933)  aspirin chewable tablet 324 mg (324 mg Oral Given 04/27/13 1933)   Patient Vitals for the past 24 hrs:  BP Temp Temp src Pulse Resp SpO2 Height Weight  04/27/13 2000 133/55 mmHg - - 49 18 95 % - -  04/27/13 1941 130/45 mmHg - - 56 20 94 % - -  04/27/13 1747 150/75 mmHg 98.1 F (36.7 C) Oral 61 18 97 % 5' (1.524 m) 200 lb (90.719 kg)   8:30 PM Reevaluation with update and discussion. After initial assessment and treatment, an updated evaluation reveals she remains without recurrence of her chest pain . Espyn Radwan L    Date: 04/27/13  Rate: 57  Rhythm: sinus bradycardia  QRS Axis: normal  PR and QT Intervals: PR shortened  ST/T Wave abnormalities: nonspecific T wave changes  PR and QRS Conduction Disutrbances:none  Narrative Interpretation:   Old EKG Reviewed: unchanged- 06/25/12   Labs Reviewed  CBC - Abnormal; Notable for the following:    Hemoglobin 11.1 (*)    HCT 34.7 (*)    All other components within normal limits  COMPREHENSIVE METABOLIC PANEL - Abnormal; Notable for the following:    Creatinine, Ser 1.12 (*)    Albumin 3.2 (*)    Total Bilirubin 0.2 (*)    GFR calc non Af Amer 49 (*)    GFR calc Af Amer 57 (*)    All other components within normal limits  URINALYSIS, ROUTINE W REFLEX MICROSCOPIC - Abnormal; Notable for the following:    Specific Gravity, Urine <1.005 (*)    All other components within normal limits  TROPONIN I  TROPONIN I   Dg Chest Portable 1  View  04/27/2013   *RADIOLOGY REPORT*  Clinical Data: Chest pain.  Shortness of breath.  CHEST - 1 VIEW  Comparison:  05/07/2012  Findings: The heart size and mediastinal contours are within normal limits.  Both lungs are clear.  IMPRESSION: No active disease.   Original Report Authenticated By: Myles Rosenthal, M.D.     1. Chest pain       MDM  Nonspecific chest pain with initial evaluation, negative for ischemia, or injury. She had a cardiac catheterization in 2009 and a cardiac stress test in 2012, which are listed as being negative for obstructive CAD, and inducible ischemia. She sees GI regularly for ongoing intestinal issues. The pain today is atypical for cardiac disease.  Nursing Notes Reviewed/ Care Coordinated Applicable Imaging Reviewed. Radiologic imaging report reviewed and images by radiography  - viewed, by me. Interpretation of Laboratory Data incorporated into ED treatment  Pan: Patient to be discharged after second troponin is negative. Followup will be with PCP or cardiology in  3 or 4 days  Flint Melter, MD 04/27/13 2146

## 2013-04-27 NOTE — ED Notes (Signed)
The patient states that she has been having right arm pain, states that the pain causes her to have some degree of weakness in her grip.

## 2013-04-27 NOTE — ED Notes (Signed)
Patient states that she started having upper abdominal pain that radiated into her back about 45 minutes prior to arrival.  States that she felt short of breath with the pain.  States that her pain is now 1/10 since her arrival to the ED.  Denies nausea or vomiting.  States she did feel lightheaded.

## 2013-04-27 NOTE — ED Notes (Signed)
Patient denies pain at this time.

## 2013-06-12 ENCOUNTER — Other Ambulatory Visit: Payer: Self-pay | Admitting: Neurosurgery

## 2013-06-12 DIAGNOSIS — M5412 Radiculopathy, cervical region: Secondary | ICD-10-CM

## 2013-06-21 ENCOUNTER — Ambulatory Visit
Admission: RE | Admit: 2013-06-21 | Discharge: 2013-06-21 | Disposition: A | Discharge status: Home / Self Care | Payer: Medicare Other | Source: Ambulatory Visit | Attending: Neurosurgery | Admitting: Neurosurgery

## 2013-06-21 DIAGNOSIS — M5412 Radiculopathy, cervical region: Secondary | ICD-10-CM

## 2013-07-31 ENCOUNTER — Other Ambulatory Visit: Payer: Self-pay | Admitting: Neurosurgery

## 2013-07-31 DIAGNOSIS — M5412 Radiculopathy, cervical region: Secondary | ICD-10-CM

## 2013-08-04 ENCOUNTER — Ambulatory Visit
Admission: RE | Admit: 2013-08-04 | Discharge: 2013-08-04 | Disposition: A | Discharge status: Home / Self Care | Payer: Medicare Other | Source: Ambulatory Visit | Attending: Neurosurgery | Admitting: Neurosurgery

## 2013-08-04 VITALS — BP 140/72 | HR 48

## 2013-08-04 DIAGNOSIS — M5412 Radiculopathy, cervical region: Secondary | ICD-10-CM

## 2013-08-04 MED ORDER — IOHEXOL 300 MG/ML  SOLN
10.0000 mL | Freq: Once | INTRAMUSCULAR | Status: AC | PRN
  Administered 2013-08-04: 10 mL via INTRATHECAL

## 2013-08-04 MED ORDER — DIAZEPAM 5 MG PO TABS: 5.0000 mg | ORAL_TABLET | Freq: Once | ORAL | Status: DC

## 2013-08-04 NOTE — Progress Notes (Signed)
Patient states she has been off Celexa since Friday.

## 2013-08-18 ENCOUNTER — Ambulatory Visit (INDEPENDENT_AMBULATORY_CARE_PROVIDER_SITE_OTHER): Payer: Medicare Other | Admitting: Physician Assistant

## 2013-08-18 ENCOUNTER — Telehealth: Payer: Self-pay | Admitting: Internal Medicine

## 2013-08-18 ENCOUNTER — Encounter: Payer: Self-pay | Admitting: Physician Assistant

## 2013-08-18 VITALS — BP 109/69 | HR 69 | Ht 59.0 in | Wt 202.0 lb

## 2013-08-18 DIAGNOSIS — E785 Hyperlipidemia, unspecified: Secondary | ICD-10-CM

## 2013-08-18 DIAGNOSIS — R5381 Other malaise: Secondary | ICD-10-CM

## 2013-08-18 DIAGNOSIS — R5383 Other fatigue: Secondary | ICD-10-CM

## 2013-08-18 DIAGNOSIS — R42 Dizziness and giddiness: Secondary | ICD-10-CM

## 2013-08-18 DIAGNOSIS — R0602 Shortness of breath: Secondary | ICD-10-CM

## 2013-08-18 LAB — BASIC METABOLIC PANEL
CO2: 27 mEq/L (ref 19–32)
Calcium: 9.1 mg/dL (ref 8.4–10.5)
Creatinine, Ser: 0.9 mg/dL (ref 0.4–1.2)
GFR: 66.89 mL/min (ref >60.00)
Glucose, Bld: 98 mg/dL (ref 70–99)

## 2013-08-18 LAB — CBC WITH DIFFERENTIAL/PLATELET
Basophils Absolute: 0 10*3/uL (ref 0.0–0.1)
Eosinophils Absolute: 0.1 10*3/uL (ref 0.0–0.7)
Lymphocytes Relative: 40.5 % (ref 12.0–46.0)
MCHC: 33.2 g/dL (ref 30.0–36.0)
Monocytes Relative: 8.8 % (ref 3.0–12.0)
Neutrophils Relative %: 49 % (ref 43.0–77.0)
RDW: 16.7 % — ABNORMAL HIGH (ref 11.5–14.6)

## 2013-08-18 LAB — TSH: TSH: 1.97 u[IU]/mL (ref 0.35–5.50)

## 2013-08-18 NOTE — Telephone Encounter (Signed)
Pt. Called today because she has been having dizziness . Last night pt's heart rate was 41 to 45 beats/ minute. Now pt's heart rate is 66 beats/ minute BP 145/84 pt still C/O of dizziness. Pt has a history of breathing problems. So she said always his SOB. Pt states has been on her PCP for low heart rate and was tolled that could be cause for the spur on a bone in  her neck that is causing the low heart rate. Pt was seen by the neurology MD which will said that the bone spur wont cause the low heart rate. Pt has an appointment with Tereso Newcomer PA today at 2:20 PM pt aware.

## 2013-08-18 NOTE — Progress Notes (Signed)
[address], [address] [place], [place]  [address] Phone: [phone] Fax:  [phone]  Date:  08/18/2013   ID:  [name], [name] Apr 09, 1946, MRN [id number]  PCP:  Alan Mulder, MD  Cardiologist:  Dr. Dietrich Pates     History of Present Illness: Alexis Rios is a 67 y.o. female who returns for the evaluation of dizziness.  She has a hx of DM2, HTN, HL, obesity, asthma, chronic pain. LHC 03/2008: Mid AV groove circumflex 30%, mid RCA 20%, EF 60%. She established with Dr. Tenny Craw 08/2011. She was complaining of chest pain at that time. Stress test and echocardiogram was arranged. Echo 09/2011: Mild LVH, EF 60-65%, grade 1 diastolic dysfunction, trivial AI, mild MR, mild LAE. Dobutamine Myoview 09/2011: No scar or ischemia, no EKG changes, study not gated. Cardiopulmonary stress test 2013: Normal functional capacity when compared to matched sedentary norms; obesity and deconditioning primary factors contributing to overall limitation; ?OHS.  Last seen in 06/2012.  She has recently seen Dr. Gerlene Fee for cervical DDD.  she tells me she is to get ESI first to help control her pain.  She is now on Vicodin prn and Robaxin in addition to Topamax, Xanax prn, Neurontin, Celexa.  She notes dizziness over the last several days.  It is fairly constant.  She denies postural dizziness.  She seems to feel more dizzy when she is active.  She does notes fatigue and weakness. No syncope.  She had one day that felt like she may pass out.  No CP.  DOE is unchanged.  She is NYHA Class III.  She sleeps on an incline chronically.  She wakes up with bronchospasm sometimes that is improved with bronchodilators.  She has chronic pedal edema without change.  She notes her HRs have been in the 40s at home by her BP monitor.    Labs (7/13):  BNP 92 Labs (8/13):  LDL 114 Labs (5/14):  K 3.5, Cr 1.12, ALT 17, Hgb 11.1    Wt Readings from Last 3 Encounters:  08/18/13 202 lb (91.627 kg)  04/27/13 200 lb  (90.719 kg)  02/07/13 212 lb 11.9 oz (96.5 kg)     Past Medical History  Diagnosis Date  . Diabetes mellitus   . Asthma   . Chronic back pain   . Hypercholesteremia   . Seasonal allergies   . S/P cardiac catheterization     LHC 03/2008: Mid AV groove circumflex 30%, mid RCA 20%, EF 60%  . H/O echocardiogram     Echo 09/2011: Mild LVH, EF 60-65%, grade 1 diastolic dysfunction, trivial AI, mild MR, mild LAE.  . H/O exercise stress test     Dobutamine Myoview 09/2011: No scar or ischemia, no EKG changes, study not gated.  . Skin cancer     scc/bcc  . Anxiety   . Arthritis   . COPD (chronic obstructive pulmonary disease)   . Depression   . History of gallstones   . IBS (irritable bowel syndrome)   . History of pneumonia   . Carpal tunnel syndrome   . Diverticulosis   . GERD (gastroesophageal reflux disease)   . Subclinical hypothyroidism   . Peripheral neuropathy   . Osteoporosis   . Gastroparesis     ?  . HH (hiatus hernia)   . Complication of anesthesia     if no breathing tx before anesthesia wakes with asthma  . Pneumonia   . Sleep apnea     cpap wwith  O2 8yrs  . UTI (urinary tract infection)   . Stones in the urinary tract   . Chronic headaches     migraines  . Dyspnea     Cardiopulmonary stress test 2013: Normal functional capacity when compared to matched sedentary norms; obesity and deconditioning primary factors contributing to overall limitation; ?OHS.    Current Outpatient Prescriptions  Medication Sig Dispense Refill  . albuterol (PROVENTIL HFA;VENTOLIN HFA) 108 (90 BASE) MCG/ACT inhaler Inhale 2 puffs into the lungs every 6 (six) hours as needed. For shortness of breath       . albuterol (PROVENTIL) (2.5 MG/3ML) 0.083% nebulizer solution Take 2.5 mg by nebulization every 6 (six) hours as needed for shortness of breath.       . ALPRAZolam (XANAX) 0.5 MG tablet Take 0.5 mg by mouth 2 (two) times daily as needed for anxiety.       Marland Kitchen aspirin-sod bicarb-citric  acid (ALKA-SELTZER) 325 MG TBEF Take 325 mg by mouth every 6 (six) hours as needed (for pain).      . citalopram (CELEXA) 40 MG tablet Take 40 mg by mouth daily.       Elwin Sleight 200-5 MCG/ACT AERO Take 2 puffs by mouth 2 (two) times daily.       Marland Kitchen gabapentin (NEURONTIN) 300 MG capsule Take 300 mg by mouth 3 (three) times daily.        Marland Kitchen HYDROcodone-acetaminophen (NORCO/VICODIN) 5-325 MG per tablet Take 1-2 tablets by mouth every 4 (four) hours as needed for pain.      . metFORMIN (GLUCOPHAGE-XR) 500 MG 24 hr tablet Take 2 tablets by mouth Twice daily.      . methocarbamol (ROBAXIN) 500 MG tablet Take 1 tablet (500 mg total) by mouth every 6 (six) hours as needed.  40 tablet  1  . pantoprazole (PROTONIX) 40 MG tablet Take 40 mg by mouth 2 (two) times daily.      Marland Kitchen topiramate (TOPAMAX) 50 MG tablet Take 50 mg by mouth at bedtime.       No current facility-administered medications for this visit.    Allergies:    Allergies  Allergen Reactions  . Sulfa Antibiotics Swelling    Mouth and throat   . Codeine Itching  . Cymbalta [Duloxetine Hcl] Other (See Comments)    Cannot urinate  . Keflet [Cephalexin Monohydrate] Itching  . Macrodantin [Nitrofurantoin Macrocrystal] Nausea And Vomiting  . Tape Rash    Social History:  The patient  reports that she has never smoked. She has never used smokeless tobacco. She reports that she does not drink alcohol or use illicit drugs.   ROS:  Please see the history of present illness.   She has an occ wheeze.  She has chronic diarrhea.   All other systems reviewed and negative.   PHYSICAL EXAM: VS:  BP 129/79  Pulse 61  Ht 4\' 11"  (1.499 m)  Wt 202 lb (91.627 kg)  BMI 40.78 kg/m2  Filed Vitals:   08/18/13 1449 08/18/13 1451 08/18/13 1453 08/18/13 1455  BP: 110/72 117/74 108/70 109/69  Pulse: 60 70 68 69  Height:      Weight:        Ambulatory O2:  Patient's O2 remained above 92% and HR increased to 101 with ambulation in the office.  Well  nourished, well developed, in no acute distress HEENT: normal Neck: no JVD Cardiac:  normal S1, S2; RRR; no murmur Lungs:  clear to auscultation bilaterally, no wheezing, rhonchi or rales Abd:  soft, nontender, no hepatomegaly Ext: no edema Skin: warm and dry Neuro:  CNs 2-12 intact, no focal abnormalities noted  EKG:  NSR, HR 61, normal axis, NSSTTW changes     ASSESSMENT AND PLAN:  1. Dizziness:  Etiology not clear.  Orthostatic VS do not demonstrate a significant BP drop or HR increase.  Her HR increases with walking the office.  ECG is unchanged.  I suspect her dizziness is related the combination of medications she is on (Celexa, Xanan, Topamax, Robaxin, Vicodin).  I will get a BMET, TSH, CBC today. I will get a 48 hour Holter.  I have asked her to follow up with her PCP or neurosurgeon to see if her medications can be adjusted.   2. Dyspnea:  Largely felt to be from deconditioning/?OHS.  No significant changes.  Continue current Rx. 3. Hyperlipidemia:  Managed by lipid clinic. 4. Disposition:  Follow up with Dr. Dietrich Pates in 4 weeks.   Signed, Tereso Newcomer, PA-C  08/18/2013 2:26 PM

## 2013-08-18 NOTE — Patient Instructions (Addendum)
DISCUSS WITH YOUR PCP/NEURO DR. VICODIN/ROBAXIN POSSIBLY CAUSING DIZZINESS  LABS TODAY; BMET, CBC, TSH  Your physician has recommended that you wear a holter monitor. Holter monitors are medical devices that record the heart's electrical activity. Doctors most often use these monitors to diagnose arrhythmias. Arrhythmias are problems with the speed or rhythm of the heartbeat. The monitor is a small, portable device. You can wear one while you do your normal daily activities. This is usually used to diagnose what is causing palpitations/syncope (passing out).  Your physician recommends that you schedule a follow-up appointment in: 4 WEEKS WITH DR.ROSS

## 2013-08-19 ENCOUNTER — Telehealth: Payer: Self-pay | Admitting: *Deleted

## 2013-08-19 NOTE — Telephone Encounter (Signed)
pt notified about lab results with verbal understanding  

## 2013-09-29 ENCOUNTER — Encounter (INDEPENDENT_AMBULATORY_CARE_PROVIDER_SITE_OTHER): Payer: Medicare Other

## 2013-09-29 ENCOUNTER — Encounter: Payer: Self-pay | Admitting: *Deleted

## 2013-09-29 DIAGNOSIS — R0602 Shortness of breath: Secondary | ICD-10-CM

## 2013-09-29 DIAGNOSIS — R42 Dizziness and giddiness: Secondary | ICD-10-CM

## 2013-09-29 NOTE — Progress Notes (Signed)
Patient ID: Alexis Rios, female   DOB: 1946/01/09, 67 y.o.   MRN: [id number] 48 hour E-Cardio holter monitor applied to patient.

## 2013-10-06 ENCOUNTER — Encounter: Payer: Self-pay | Admitting: Internal Medicine

## 2013-10-06 ENCOUNTER — Ambulatory Visit (INDEPENDENT_AMBULATORY_CARE_PROVIDER_SITE_OTHER): Payer: Medicare Other | Admitting: Internal Medicine

## 2013-10-06 VITALS — BP 120/82 | HR 75 | Ht 59.0 in | Wt 212.0 lb

## 2013-10-06 DIAGNOSIS — E785 Hyperlipidemia, unspecified: Secondary | ICD-10-CM

## 2013-10-06 DIAGNOSIS — I25810 Atherosclerosis of coronary artery bypass graft(s) without angina pectoris: Secondary | ICD-10-CM

## 2013-10-06 DIAGNOSIS — R42 Dizziness and giddiness: Secondary | ICD-10-CM

## 2013-10-06 DIAGNOSIS — M1990 Unspecified osteoarthritis, unspecified site: Secondary | ICD-10-CM

## 2013-10-06 NOTE — Progress Notes (Signed)
HPI She has a hx of DM2, HTN, HL, obesity, asthma, chronic pain and CAD.   LHC 03/2008: Mid AV groove circumflex 30%, mid RCA 20%, EF 60%.  Stress test and echocardiogram was arranged. Echo 09/2011: Mild LVH, EF 60-65%, grade 1 diastolic dysfunction, trivial AI, mild MR, mild LAE. Dobutamine Myoview 09/2011: No scar or ischemia, no EKG changes, study not gated. Cardiopulmonary stress test 2013: Normal functional capacity when compared to matched sedentary norms; obesity and deconditioning primary factors contributing to overall limitation; ?OHS. Last seen in 06/2012.  She has recently seen Dr. Gerlene Fee for cervical DDD.  She was seen by Wende Mott a few wks ago.  Complaining of dizziness.  Felt primarily due to meds  Since seen she still has dizziness  Complains more of back pains.  Does have CP at rest that goes to back Unchanged  Chronic. No change in breathing   Allergies  Allergen Reactions  . Sulfa Antibiotics Swelling    Mouth and throat   . Codeine Itching  . Cymbalta [Duloxetine Hcl] Other (See Comments)    Cannot urinate  . Keflet [Cephalexin Monohydrate] Itching  . Macrodantin [Nitrofurantoin Macrocrystal] Nausea And Vomiting  . Tape Rash    Current Outpatient Prescriptions  Medication Sig Dispense Refill  . albuterol (PROVENTIL HFA;VENTOLIN HFA) 108 (90 BASE) MCG/ACT inhaler Inhale 2 puffs into the lungs every 6 (six) hours as needed. For shortness of breath       . albuterol (PROVENTIL) (2.5 MG/3ML) 0.083% nebulizer solution Take 2.5 mg by nebulization every 6 (six) hours as needed for shortness of breath.       . Albuterol Sulfate (PROAIR HFA IN) Inhale into the lungs. USE TWO PUFFS THREE TO FOUR TIMES DAILY      . ALPRAZolam (XANAX) 0.5 MG tablet Take 0.5 mg by mouth 2 (two) times daily as needed for anxiety.       Marland Kitchen aspirin-sod bicarb-citric acid (ALKA-SELTZER) 325 MG TBEF Take 325 mg by mouth every 6 (six) hours as needed (for pain).      . citalopram (CELEXA) 40 MG tablet  Take 40 mg by mouth daily.       Elwin Sleight 200-5 MCG/ACT AERO Take 2 puffs by mouth 2 (two) times daily.       Marland Kitchen gabapentin (NEURONTIN) 300 MG capsule Take 300 mg by mouth 3 (three) times daily.        Marland Kitchen HYDROcodone-acetaminophen (NORCO/VICODIN) 5-325 MG per tablet Take 1-2 tablets by mouth every 4 (four) hours as needed for pain.      Marland Kitchen LIFESCAN FINEPOINT LANCETS MISC       . metFORMIN (GLUCOPHAGE-XR) 500 MG 24 hr tablet Take 2 tablets by mouth Twice daily.      . methocarbamol (ROBAXIN) 500 MG tablet Take 1 tablet (500 mg total) by mouth every 6 (six) hours as needed.  40 tablet  1  . ONE TOUCH ULTRA TEST test strip       . pantoprazole (PROTONIX) 40 MG tablet Take 40 mg by mouth 2 (two) times daily.      Marland Kitchen topiramate (TOPAMAX) 50 MG tablet Take 50 mg by mouth at bedtime.       No current facility-administered medications for this visit.    Past Medical History  Diagnosis Date  . Diabetes mellitus   . Asthma   . Chronic back pain   . Hypercholesteremia   . Seasonal allergies   . S/P cardiac catheterization     LHC 03/2008: Mid  AV groove circumflex 30%, mid RCA 20%, EF 60%  . H/O echocardiogram     Echo 09/2011: Mild LVH, EF 60-65%, grade 1 diastolic dysfunction, trivial AI, mild MR, mild LAE.  . H/O exercise stress test     Dobutamine Myoview 09/2011: No scar or ischemia, no EKG changes, study not gated.  . Skin cancer     scc/bcc  . Anxiety   . Arthritis   . COPD (chronic obstructive pulmonary disease)   . Depression   . History of gallstones   . IBS (irritable bowel syndrome)   . History of pneumonia   . Carpal tunnel syndrome   . Diverticulosis   . GERD (gastroesophageal reflux disease)   . Subclinical hypothyroidism   . Peripheral neuropathy   . Osteoporosis   . Gastroparesis     ?  . HH (hiatus hernia)   . Complication of anesthesia     if no breathing tx before anesthesia wakes with asthma  . Pneumonia   . Sleep apnea     cpap wwith O2 80yrs  . UTI (urinary  tract infection)   . Stones in the urinary tract   . Chronic headaches     migraines  . Dyspnea     Cardiopulmonary stress test 2013: Normal functional capacity when compared to matched sedentary norms; obesity and deconditioning primary factors contributing to overall limitation; ?OHS.    Past Surgical History  Procedure Laterality Date  . Cholecystectomy    . Tonsillectomy    . Shoulder surg      x2 R  . Back surgery      x3  . Knee surgery      R  . Foot surgery      R  . Wrist surgery      L  . Appendectomy    . Abdominal hysterectomy      partial  . Breast surgery      lump x 2 R  . Toe amputation      right  . Umbilical hernia repair    . Nose surgery      skin cancer excision  . Back surgery      Family History  Problem Relation Age of Onset  . Allergies Daughter   . Liver cancer Brother   . Leukemia Brother   . Emphysema Brother   . Diabetes Other     all brothers and sister x 5  . Heart disease Mother   . Heart disease Father   . Stroke Father     History   Social History  . Marital Status: Married    Spouse Name: N/A    Number of Children: 2  . Years of Education: N/A   Occupational History  . retired    Social History Main Topics  . Smoking status: Never Smoker   . Smokeless tobacco: Never Used  . Alcohol Use: No  . Drug Use: No  . Sexual Activity: Not on file   Other Topics Concern  . Not on file   Social History Narrative  . No narrative on file    Review of Systems:  All systems reviewed.  They are negative to the above problem except as previously stated.  Vital Signs: BP 120/82  Pulse 75  Ht 4\' 11"  (1.499 m)  Wt 212 lb (96.163 kg)  BMI 42.8 kg/m2  SpO2 95%  Physical Exam Patient is in NAD HEENT:  Normocephalic, atraumatic. EOMI, PERRLA.  Neck: JVP is normal.  No bruits.  Lungs: clear to auscultation. No rales no wheezes.  Heart: Regular rate and rhythm. Normal S1, S2. No S3.   No significant murmurs. PMI not  displaced.  Abdomen:  Supple, nontender. Normal bowel sounds. No masses. No hepatomegaly.  Extremities:   Good distal pulses throughout. No lower extremity edema.  Musculoskeletal :moving all extremities.  Neuro:   alert and oriented x3.  CN II-XII grossly intact.  ASSESSMENT AND PLAN:  1. Dizziness: I agree with Wende Mott  I think it is primarily from meds.   2. CP  Atypical  Not with activity 3  CAD  MIld by cath 3. Hyperlipidemia:  Managed by lipid clinic

## 2013-10-06 NOTE — Patient Instructions (Signed)
Your physician recommends that you schedule a follow-up appointment in: AS NEEDED  

## 2013-10-16 ENCOUNTER — Other Ambulatory Visit: Payer: Self-pay

## 2013-11-13 ENCOUNTER — Observation Stay (HOSPITAL_COMMUNITY)
Admission: EM | Admit: 2013-11-13 | Discharge: 2013-11-14 | Disposition: A | Discharge status: Home / Self Care | Payer: Medicare Other | Attending: Internal Medicine | Admitting: Internal Medicine

## 2013-11-13 ENCOUNTER — Encounter (HOSPITAL_COMMUNITY): Payer: Self-pay | Admitting: Emergency Medicine

## 2013-11-13 ENCOUNTER — Emergency Department (HOSPITAL_COMMUNITY): Payer: Medicare Other

## 2013-11-13 DIAGNOSIS — I2510 Atherosclerotic heart disease of native coronary artery without angina pectoris: Secondary | ICD-10-CM

## 2013-11-13 DIAGNOSIS — K219 Gastro-esophageal reflux disease without esophagitis: Secondary | ICD-10-CM | POA: Insufficient documentation

## 2013-11-13 DIAGNOSIS — J449 Chronic obstructive pulmonary disease, unspecified: Secondary | ICD-10-CM

## 2013-11-13 DIAGNOSIS — R079 Chest pain, unspecified: Principal | ICD-10-CM | POA: Insufficient documentation

## 2013-11-13 DIAGNOSIS — E785 Hyperlipidemia, unspecified: Secondary | ICD-10-CM | POA: Insufficient documentation

## 2013-11-13 DIAGNOSIS — R42 Dizziness and giddiness: Secondary | ICD-10-CM | POA: Diagnosis present

## 2013-11-13 HISTORY — DX: Atherosclerotic heart disease of native coronary artery without angina pectoris: I25.10

## 2013-11-13 LAB — BASIC METABOLIC PANEL
CO2: 28 mEq/L (ref 19–32)
Calcium: 8.6 mg/dL (ref 8.4–10.5)
Potassium: 3.5 mEq/L (ref 3.5–5.1)
Sodium: 137 mEq/L (ref 135–145)

## 2013-11-13 LAB — CBC WITH DIFFERENTIAL/PLATELET
Basophils Absolute: 0 10*3/uL (ref 0.0–0.1)
Eosinophils Relative: 2 % (ref 0–5)
Lymphocytes Relative: 46 % (ref 12–46)
Neutro Abs: 2.3 10*3/uL (ref 1.7–7.7)
Neutrophils Relative %: 38 % — ABNORMAL LOW (ref 43–77)
Platelets: 255 10*3/uL (ref 150–400)
RBC: 3.93 MIL/uL (ref 3.87–5.11)
RDW: 14.5 % (ref 11.5–15.5)
WBC: 6.1 10*3/uL (ref 4.0–10.5)

## 2013-11-13 LAB — TROPONIN I
Troponin I: <0.3 ng/mL (ref <0.30)
Troponin I: <0.3 ng/mL (ref <0.30)

## 2013-11-13 LAB — GLUCOSE, CAPILLARY: Glucose-Capillary: 121 mg/dL — ABNORMAL HIGH (ref 70–99)

## 2013-11-13 MED ORDER — INFLUENZA VAC SPLIT QUAD 0.5 ML IM SUSP
0.5000 mL | INTRAMUSCULAR | Status: AC
  Administered 2013-11-14: 0.5 mL via INTRAMUSCULAR
  Filled 2013-11-13: qty 0.5

## 2013-11-13 MED ORDER — ASPIRIN 325 MG PO TABS
325.0000 mg | ORAL_TABLET | Freq: Every day | ORAL | Status: DC
  Administered 2013-11-14: 325 mg via ORAL
  Filled 2013-11-13: qty 1

## 2013-11-13 MED ORDER — MOMETASONE FURO-FORMOTEROL FUM 200-5 MCG/ACT IN AERO
2.0000 | INHALATION_SPRAY | Freq: Two times a day (BID) | RESPIRATORY_TRACT | Status: DC
  Administered 2013-11-13 – 2013-11-14 (×2): 2 via RESPIRATORY_TRACT
  Filled 2013-11-13: qty 8.8

## 2013-11-13 MED ORDER — GABAPENTIN 300 MG PO CAPS
300.0000 mg | ORAL_CAPSULE | Freq: Three times a day (TID) | ORAL | Status: DC
  Administered 2013-11-13 – 2013-11-14 (×2): 300 mg via ORAL
  Filled 2013-11-13 (×2): qty 1

## 2013-11-13 MED ORDER — SODIUM CHLORIDE 0.9 % IV SOLN
INTRAVENOUS | Status: AC
  Administered 2013-11-13: 19:00:00 via INTRAVENOUS

## 2013-11-13 MED ORDER — ACETAMINOPHEN 650 MG RE SUPP: 650.0000 mg | Freq: Four times a day (QID) | RECTAL | Status: DC | PRN

## 2013-11-13 MED ORDER — ONDANSETRON HCL 4 MG/2ML IJ SOLN: 4.0000 mg | Freq: Four times a day (QID) | INTRAMUSCULAR | Status: DC | PRN

## 2013-11-13 MED ORDER — MECLIZINE HCL 12.5 MG PO TABS
25.0000 mg | ORAL_TABLET | Freq: Once | ORAL | Status: AC
  Administered 2013-11-13: 25 mg via ORAL
  Filled 2013-11-13: qty 2

## 2013-11-13 MED ORDER — CITALOPRAM HYDROBROMIDE 20 MG PO TABS
40.0000 mg | ORAL_TABLET | Freq: Every day | ORAL | Status: DC
  Administered 2013-11-13 – 2013-11-14 (×2): 40 mg via ORAL
  Filled 2013-11-13 (×2): qty 2

## 2013-11-13 MED ORDER — ALBUTEROL SULFATE (5 MG/ML) 0.5% IN NEBU: 2.5000 mg | INHALATION_SOLUTION | RESPIRATORY_TRACT | Status: DC | PRN

## 2013-11-13 MED ORDER — HYDROCODONE-ACETAMINOPHEN 5-325 MG PO TABS: 1.0000 | ORAL_TABLET | Freq: Four times a day (QID) | ORAL | Status: DC | PRN

## 2013-11-13 MED ORDER — ACETAMINOPHEN 325 MG PO TABS
650.0000 mg | ORAL_TABLET | Freq: Four times a day (QID) | ORAL | Status: DC | PRN
  Administered 2013-11-14: 650 mg via ORAL
  Filled 2013-11-13: qty 2

## 2013-11-13 MED ORDER — ALPRAZOLAM 0.25 MG PO TABS: 0.2500 mg | ORAL_TABLET | Freq: Two times a day (BID) | ORAL | Status: DC | PRN

## 2013-11-13 MED ORDER — ENOXAPARIN SODIUM 40 MG/0.4ML ~~LOC~~ SOLN
40.0000 mg | SUBCUTANEOUS | Status: DC
  Administered 2013-11-13: 40 mg via SUBCUTANEOUS
  Filled 2013-11-13: qty 0.4

## 2013-11-13 MED ORDER — ASPIRIN 325 MG PO TABS
325.0000 mg | ORAL_TABLET | Freq: Once | ORAL | Status: AC
  Administered 2013-11-13: 325 mg via ORAL
  Filled 2013-11-13: qty 1

## 2013-11-13 MED ORDER — ONDANSETRON HCL 4 MG/2ML IJ SOLN
4.0000 mg | Freq: Once | INTRAMUSCULAR | Status: AC
  Administered 2013-11-13: 4 mg via INTRAMUSCULAR
  Filled 2013-11-13: qty 2

## 2013-11-13 MED ORDER — PANTOPRAZOLE SODIUM 40 MG PO TBEC
40.0000 mg | DELAYED_RELEASE_TABLET | Freq: Two times a day (BID) | ORAL | Status: DC
  Administered 2013-11-14: 40 mg via ORAL
  Filled 2013-11-13: qty 1

## 2013-11-13 MED ORDER — SODIUM CHLORIDE 0.9 % IV BOLUS (SEPSIS)
1000.0000 mL | Freq: Once | INTRAVENOUS | Status: AC
  Administered 2013-11-13: 1000 mL via INTRAVENOUS

## 2013-11-13 MED ORDER — SODIUM CHLORIDE 0.9 % IJ SOLN
3.0000 mL | Freq: Two times a day (BID) | INTRAMUSCULAR | Status: DC
  Administered 2013-11-13 – 2013-11-14 (×2): 3 mL via INTRAVENOUS

## 2013-11-13 MED ORDER — ONDANSETRON HCL 4 MG PO TABS: 4.0000 mg | ORAL_TABLET | Freq: Four times a day (QID) | ORAL | Status: DC | PRN

## 2013-11-13 MED ORDER — TOPIRAMATE 100 MG PO TABS
100.0000 mg | ORAL_TABLET | Freq: Every day | ORAL | Status: DC
  Administered 2013-11-13: 100 mg via ORAL
  Filled 2013-11-13 (×4): qty 1

## 2013-11-13 NOTE — H&P (Signed)
PATIENT DETAILS Name: Alexis Rios Age: 67 y.o. Sex: female Date of Birth: Mar 06, 1946 Admit Date: 11/13/2013 ZOX:[id number] J, MD   CHIEF COMPLAINT:  Chest pain, lightheadedness and nausea  HPI: Alexis Rios is a 66 y.o. female with a Past Medical History of chronic pain syndrome on Vicodin, depression, irritable bowel syndrome, diabetes, gastroesophageal reflux disease who presents today with the above noted complaint. Per patient she was in her usual state of health until early this morning when she started having sharp retrosternal chest pains without any radiation. Pain lasted a few minutes, there was no associated nausea vomiting, shortness of breath or diaphoresis. One hour after the pain, patient started experiencing nausea and "dizziness". Upon asking whether the room spins around her-patient describes the "dizziness" as mostly lightheadedness. Since this was persistent, she presented to the ED. In the ED she was not found to be orthostatic, CT of the head was negative. I was subsequently asked to admit this patient for further evaluation and treatment. During my evaluation, patient appeared very comfortable. She claimed that her dizziness was significantly better and very minimal. She was no longer having chest pain.  ALLERGIES:   Allergies  Allergen Reactions  . Sulfa Antibiotics Swelling    Mouth and throat   . Codeine Itching  . Cymbalta [Duloxetine Hcl] Other (See Comments)    Cannot urinate  . Keflet [Cephalexin Monohydrate] Itching  . Macrodantin [Nitrofurantoin Macrocrystal] Nausea And Vomiting  . Tape Rash    PAST MEDICAL HISTORY: Past Medical History  Diagnosis Date  . Diabetes mellitus   . Asthma   . Chronic back pain   . Hypercholesteremia   . Seasonal allergies   . S/P cardiac catheterization     LHC 03/2008: Mid AV groove circumflex 30%, mid RCA 20%, EF 60%  . H/O echocardiogram     Echo 09/2011: Mild LVH, EF 60-65%, grade 1 diastolic  dysfunction, trivial AI, mild MR, mild LAE.  . H/O exercise stress test     Dobutamine Myoview 09/2011: No scar or ischemia, no EKG changes, study not gated.  . Skin cancer     scc/bcc  . Anxiety   . Arthritis   . COPD (chronic obstructive pulmonary disease)   . Depression   . History of gallstones   . IBS (irritable bowel syndrome)   . History of pneumonia   . Carpal tunnel syndrome   . Diverticulosis   . GERD (gastroesophageal reflux disease)   . Subclinical hypothyroidism   . Peripheral neuropathy   . Osteoporosis   . Gastroparesis     ?  . HH (hiatus hernia)   . Complication of anesthesia     if no breathing tx before anesthesia wakes with asthma  . Pneumonia   . Sleep apnea     cpap wwith O2 45yrs  . UTI (urinary tract infection)   . Stones in the urinary tract   . Chronic headaches     migraines  . Dyspnea     Cardiopulmonary stress test 2013: Normal functional capacity when compared to matched sedentary norms; obesity and deconditioning primary factors contributing to overall limitation; ?OHS.    PAST SURGICAL HISTORY: Past Surgical History  Procedure Laterality Date  . Cholecystectomy    . Tonsillectomy    . Shoulder surg      x2 R  . Back surgery      x3  . Knee surgery      R  . Foot surgery  R  . Wrist surgery      L  . Appendectomy    . Abdominal hysterectomy      partial  . Breast surgery      lump x 2 R  . Toe amputation      right  . Umbilical hernia repair    . Nose surgery      skin cancer excision  . Back surgery      MEDICATIONS AT HOME: Prior to Admission medications   Medication Sig Start Date End Date Taking? Authorizing Provider  albuterol (PROVENTIL HFA;VENTOLIN HFA) 108 (90 BASE) MCG/ACT inhaler Inhale 2 puffs into the lungs every 6 (six) hours as needed. For shortness of breath    Yes Historical Provider, MD  albuterol (PROVENTIL) (2.5 MG/3ML) 0.083% nebulizer solution Take 2.5 mg by nebulization every 6 (six) hours as  needed for shortness of breath.    Yes Historical Provider, MD  ALPRAZolam Prudy Feeler) 0.5 MG tablet Take 0.5 mg by mouth 2 (two) times daily as needed for anxiety.    Yes Historical Provider, MD  citalopram (CELEXA) 40 MG tablet Take 40 mg by mouth daily.    Yes Historical Provider, MD  DULERA 200-5 MCG/ACT AERO Take 2 puffs by mouth 2 (two) times daily.  08/11/11  Yes Historical Provider, MD  gabapentin (NEURONTIN) 300 MG capsule Take 300 mg by mouth 3 (three) times daily.     Yes Historical Provider, MD  HYDROcodone-acetaminophen (NORCO/VICODIN) 5-325 MG per tablet Take 1-2 tablets by mouth every 4 (four) hours as needed for pain. 02/10/13  Yes Reinaldo Meeker, MD  metFORMIN (GLUCOPHAGE-XR) 500 MG 24 hr tablet Take 2 tablets by mouth Twice daily. 08/24/11  Yes Historical Provider, MD  methocarbamol (ROBAXIN) 500 MG tablet Take 1 tablet (500 mg total) by mouth every 6 (six) hours as needed. 02/10/13  Yes Reinaldo Meeker, MD  pantoprazole (PROTONIX) 40 MG tablet Take 40 mg by mouth 2 (two) times daily.   Yes Historical Provider, MD  topiramate (TOPAMAX) 100 MG tablet Take 100 mg by mouth at bedtime.   Yes Historical Provider, MD    FAMILY HISTORY: Family History  Problem Relation Age of Onset  . Allergies Daughter   . Liver cancer Brother   . Leukemia Brother   . Emphysema Brother   . Diabetes Other     all brothers and sister x 5  . Heart disease Mother   . Heart disease Father   . Stroke Father     SOCIAL HISTORY:  reports that she has never smoked. She has never used smokeless tobacco. She reports that she does not drink alcohol or use illicit drugs.  REVIEW OF SYSTEMS:  Constitutional:   No  weight loss, night sweats,  Fevers, chills, fatigue.  HEENT:    No headaches, Difficulty swallowing,Tooth/dental problems,Sore throat,  No sneezing, itching, ear ache, nasal congestion, post nasal drip,   Cardio-vascular: No chest pain,  Orthopnea, PND, swelling in lower extremities, anasarca,   dizziness, palpitations  GI:  No heartburn, indigestion, abdominal pain, vomiting, diarrhea, change in bowel habits, loss of appetite  Resp: No shortness of breath with exertion or at rest.  No excess mucus, no productive cough, No non-productive cough,  No coughing up of blood.No change in color of mucus.No wheezing.No chest wall deformity  Skin:  no rash or lesions.  GU:  no dysuria, change in color of urine, no urgency or frequency.  No flank pain.  Musculoskeletal: No joint pain or  swelling.  No decreased range of motion.  No back pain.  Psych: No change in mood or affect.  No memory loss.   PHYSICAL EXAM: Blood pressure 113/67, pulse 54, temperature 97.5 F (36.4 C), temperature source Oral, resp. rate 11, height 5' (1.524 m), weight 98.431 kg (217 lb), SpO2 96.00%.  General appearance :Awake, alert, not in any distress. Speech Clear. Not toxic Looking HEENT: Atraumatic and Normocephalic, pupils equally reactive to light and accomodation Neck: supple, no JVD. No cervical lymphadenopathy.  Chest:Good air entry bilaterally, no added sounds  CVS: S1 S2 regular, no murmurs.  Abdomen: Bowel sounds present, Non tender and not distended with no gaurding, rigidity or rebound. Extremities: B/L Lower Ext shows no edema, both legs are warm to touch Neurology: Awake alert, and oriented X 3, CN II-XII intact, Non focal Skin:No Rash Wounds:N/A  LABS ON ADMISSION:   Recent Labs  11/13/13 0943  NA 137  K 3.5  CL 102  CO2 28  GLUCOSE 129*  BUN 11  CREATININE 0.88  CALCIUM 8.6   No results found for this basename: AST, ALT, ALKPHOS, BILITOT, PROT, ALBUMIN,  in the last 72 hours No results found for this basename: LIPASE, AMYLASE,  in the last 72 hours  Recent Labs  11/13/13 0943  WBC 6.1  NEUTROABS 2.3  HGB 11.3*  HCT 35.7*  MCV 90.8  PLT 255    Recent Labs  11/13/13 0943  TROPONINI <0.30   No results found for this basename: DDIMER,  in the last 72 hours No  components found with this basename: POCBNP,    RADIOLOGIC STUDIES ON ADMISSION: Dg Chest 2 View  11/13/2013   CLINICAL DATA:  Dizziness, nausea, chest pain  EXAM: CHEST  2 VIEW  COMPARISON:  04/27/2013  FINDINGS: The heart size and mediastinal contours are within normal limits. Both lungs are clear. The visualized skeletal structures are unremarkable.  IMPRESSION: No active cardiopulmonary disease.   Electronically Signed   By: Esperanza Heir M.D.   On: 11/13/2013 10:14   Ct Head Wo Contrast  11/13/2013   CLINICAL DATA:  New onset of dizziness without history of trauma, there is a previous history of vertigo as well.  EXAM: CT HEAD WITHOUT CONTRAST  TECHNIQUE: Contiguous axial images were obtained from the base of the skull through the vertex without intravenous contrast.  COMPARISON:  None.  FINDINGS: The ventricles are normal in size and position. There is very mild diffuse cerebral atrophy. There is no shift of the midline. The cerebellum and brainstem exhibit no acute abnormalities. There is no evidence of an evolving ischemic infarction. There is no intracranial hemorrhage.  At bone window settings the observed portions of the paranasal sinuses are clear with the exception of the right sphenoid sinus where an air-fluid level is present.  IMPRESSION: 1. There is no acute abnormality of the brain. There is no evidence of an acute intracranial hemorrhage. 2. There is an air-fluid level in the left sphenoid sinus suspicious for acute inflammation.   Electronically Signed   By: David  [place]   On: 11/13/2013 13:08     EKG: Independently reviewed. Normal sinus rhythm with some nonspecific ST changes  ASSESSMENT AND PLAN: Present on Admission:  . Chest pain - Suspecting to be atypical, likely gastroesophageal reflux disease. But has significant risk factors and has had episodic chest pains in the past. She would be admitted to a telemetry unit, cardiac enzymes will be cycled, she will placed on  aspirin. We  will follow her clinical course and monitor her clinical course. Cardiology consultation and echocardiogram have been ordered.   . Intermittent lightheadedness - This is been a recurrent issue for the patient, from outpatient [facility] cardiology notes- patient has had a 48 hour Holter monitor which is apparently negative. CT of the head is negative. Per ED physician, orthostatics were negative.  - In the past, episodic lightheadedness has been attribute it to patient being on multiple medications-narcotics, Topamax, Neurontin and benzodiazepines. But the patient, this morning she only took one Vicodin and has not taken any other medication. She claims that she usually takes maybe one tablet of Vicodin every 2 weeks.  - Etiology of lightheadedness not evident at this time, will monitor on telemetry to make sure no arrhythmias. We'll gently hydrate overnight, recheck orthostatics and have a vestibular physical therapy evaluation in a.m.   .  back pain/neck pain  - Chronic issue, with decreased the frequency of Vicodin. Currently stable, no focal neurological deficits on exam   . GERD (gastroesophageal reflux disease) - Continue PPI.  Marland Kitchen History of COPD - Carley stable, lungs clear. Continue home inhaler regimen.  Further plan will depend as patient's clinical course evolves and further radiologic and laboratory data become available. Patient will be monitored closely.  DVT Prophylaxis: Prophylactic Lovenox   Code Status: Full Code  Total time spent for admission equals 45 minutes.  [facility] Pager [phone]  If 7PM-7AM, please contact night-coverage [email or web address] Password [email or web address] 11/13/2013, 2:32 PM

## 2013-11-13 NOTE — Progress Notes (Signed)
2 gold and diamond hoop earrings were removed by patient for ct head exam. Earrings were placed in plastic bag with patient's name and room number and placed in patient's hands

## 2013-11-13 NOTE — ED Provider Notes (Signed)
CSN: [id number]     Arrival date & time 11/13/13  0909 History  This chart was scribed for Shon Baton, MD by Quintella Reichert, ED scribe.  This patient was seen in room APA12/APA12 and the patient's care was started at 9:28 AM.   Chief Complaint  Patient presents with  . Dizziness  . Nausea    The history is provided by the patient. No language interpreter was used.    HPI Comments: Alexis Rios is a 67 y.o. female with h/o DM and hypercholesteremia who presents to the Emergency Department complaining of a sudden-onset transient episode of CP that occurred this morning with subsequent nausea, dizziness and lightheadedness.  Pt states she was standing in line at a grocery store this morning at 8 AM when she suddenly experienced "a stab" in her chest.  This resolved quickly but 50 minutes later she became nauseated, dizzy and lightheaded.  She describes dizziness as a sense of being off-balance and denies a room-spinning sensation.  Lightheadedness is described as "like I was going to pass out."  She has had no more CP since then.  She denies abdominal pain, vomiting, new cough or diarrhea, or unusual leg swelling.  Pt denies personal h/o heart disease but admits to a family history including her mother who had an MI at age 45.  She denies h/o DVT/PE.  She has never smoked but has a long history of frequent second-hand smoke exposure.  She is allergic to Sulfa antibiotics; Codeine; Cymbalta; Keflet; Macrodantin; and Tape.     Patient Active Problem List   Diagnosis Date Noted  . GERD (gastroesophageal reflux disease) 09/09/2012  . Obesity 06/25/2012  . Asthma 05/28/2012  . Diastolic dysfunction 05/28/2012  . OSA (obstructive sleep apnea) 05/10/2012  . Dyspnea 05/07/2012  . Chest pain 08/24/2011  . Dyslipidemia 08/24/2011    Past Medical History  Diagnosis Date  . Diabetes mellitus   . Asthma   . Chronic back pain   . Hypercholesteremia   . Seasonal allergies   . S/P cardiac  catheterization     LHC 03/2008: Mid AV groove circumflex 30%, mid RCA 20%, EF 60%  . H/O echocardiogram     Echo 09/2011: Mild LVH, EF 60-65%, grade 1 diastolic dysfunction, trivial AI, mild MR, mild LAE.  . H/O exercise stress test     Dobutamine Myoview 09/2011: No scar or ischemia, no EKG changes, study not gated.  . Skin cancer     scc/bcc  . Anxiety   . Arthritis   . COPD (chronic obstructive pulmonary disease)   . Depression   . History of gallstones   . IBS (irritable bowel syndrome)   . History of pneumonia   . Carpal tunnel syndrome   . Diverticulosis   . GERD (gastroesophageal reflux disease)   . Subclinical hypothyroidism   . Peripheral neuropathy   . Osteoporosis   . Gastroparesis     ?  . HH (hiatus hernia)   . Complication of anesthesia     if no breathing tx before anesthesia wakes with asthma  . Pneumonia   . Sleep apnea     cpap wwith O2 41yrs  . UTI (urinary tract infection)   . Stones in the urinary tract   . Chronic headaches     migraines  . Dyspnea     Cardiopulmonary stress test 2013: Normal functional capacity when compared to matched sedentary norms; obesity and deconditioning primary factors contributing to overall limitation; ?OHS.  Past Surgical History  Procedure Laterality Date  . Cholecystectomy    . Tonsillectomy    . Shoulder surg      x2 R  . Back surgery      x3  . Knee surgery      R  . Foot surgery      R  . Wrist surgery      L  . Appendectomy    . Abdominal hysterectomy      partial  . Breast surgery      lump x 2 R  . Toe amputation      right  . Umbilical hernia repair    . Nose surgery      skin cancer excision  . Back surgery      Family History  Problem Relation Age of Onset  . Allergies Daughter   . Liver cancer Brother   . Leukemia Brother   . Emphysema Brother   . Diabetes Other     all brothers and sister x 5  . Heart disease Mother   . Heart disease Father   . Stroke Father     History   Substance Use Topics  . Smoking status: Never Smoker   . Smokeless tobacco: Never Used  . Alcohol Use: No    OB History   Grav Para Term Preterm Abortions TAB SAB Ect Mult Living                  Review of Systems  Constitutional: Negative for fever.  Respiratory: Positive for cough (chronic at night, no changes). Negative for chest tightness and shortness of breath.   Cardiovascular: Positive for chest pain. Negative for leg swelling (no new or increased swelling).  Gastrointestinal: Positive for nausea and diarrhea (chronic, no changes). Negative for vomiting and abdominal pain.  Genitourinary: Negative for dysuria.  Musculoskeletal: Negative for back pain.  Skin: Negative for wound.  Neurological: Positive for dizziness and light-headedness. Negative for headaches.  Psychiatric/Behavioral: Negative for confusion.  All other systems reviewed and are negative.     Allergies  Sulfa antibiotics; Codeine; Cymbalta; Keflet; Macrodantin; and Tape  Home Medications   Current Outpatient Rx  Name  Route  Sig  Dispense  Refill  . albuterol (PROVENTIL HFA;VENTOLIN HFA) 108 (90 BASE) MCG/ACT inhaler   Inhalation   Inhale 2 puffs into the lungs every 6 (six) hours as needed. For shortness of breath          . albuterol (PROVENTIL) (2.5 MG/3ML) 0.083% nebulizer solution   Nebulization   Take 2.5 mg by nebulization every 6 (six) hours as needed for shortness of breath.          . ALPRAZolam (XANAX) 0.5 MG tablet   Oral   Take 0.5 mg by mouth 2 (two) times daily as needed for anxiety.          . citalopram (CELEXA) 40 MG tablet   Oral   Take 40 mg by mouth daily.          Elwin Sleight 200-5 MCG/ACT AERO   Oral   Take 2 puffs by mouth 2 (two) times daily.          Marland Kitchen gabapentin (NEURONTIN) 300 MG capsule   Oral   Take 300 mg by mouth 3 (three) times daily.           Marland Kitchen HYDROcodone-acetaminophen (NORCO/VICODIN) 5-325 MG per tablet   Oral   Take 1-2 tablets by mouth  every 4 (four)  hours as needed for pain.         . metFORMIN (GLUCOPHAGE-XR) 500 MG 24 hr tablet   Oral   Take 2 tablets by mouth Twice daily.         . methocarbamol (ROBAXIN) 500 MG tablet   Oral   Take 1 tablet (500 mg total) by mouth every 6 (six) hours as needed.   40 tablet   1   . pantoprazole (PROTONIX) 40 MG tablet   Oral   Take 40 mg by mouth 2 (two) times daily.         Marland Kitchen topiramate (TOPAMAX) 100 MG tablet   Oral   Take 100 mg by mouth at bedtime.          BP 104/48  Pulse 59  Temp(Src) 97.5 F (36.4 C) (Oral)  Resp 17  Ht 5' (1.524 m)  Wt 217 lb (98.431 kg)  BMI 42.38 kg/m2  SpO2 100%  Physical Exam  Nursing note and vitals reviewed. Constitutional: She is oriented to person, place, and time.  Elderly, no acute distress  HENT:  Head: Normocephalic.  Deformity of the nasal septum with repair  Eyes: Pupils are equal, round, and reactive to light.  Neck: Neck supple.  Cardiovascular: Normal rate, regular rhythm and normal heart sounds.   No murmur heard. Pulmonary/Chest: Effort normal and breath sounds normal. No respiratory distress.  Abdominal: Soft. Bowel sounds are normal. There is no tenderness.  Musculoskeletal: She exhibits no edema.  Neurological: She is alert and oriented to person, place, and time. No cranial nerve deficit. Coordination normal.  5 out of  5 strength in all 4 extremities, no dysmetria to finger-nose-finger,  Skin: Skin is warm and dry.  Psychiatric: She has a normal mood and affect.    ED Course  Procedures (including critical care time)  DIAGNOSTIC STUDIES: Oxygen Saturation is 100% on room air, normal by my interpretation.    COORDINATION OF CARE: 9:34 AM: Discussed treatment plan which includes aspirin, anti-emetics, EKG, CXR, labs, and likely admission for further evaluation.  Pt expressed understanding and agreed to plan.   Labs Review Labs Reviewed  GLUCOSE, CAPILLARY - Abnormal; Notable for the following:     Glucose-Capillary 121 (*)    All other components within normal limits  CBC WITH DIFFERENTIAL - Abnormal; Notable for the following:    Hemoglobin 11.3 (*)    HCT 35.7 (*)    Neutrophils Relative % 38 (*)    Monocytes Relative 14 (*)    All other components within normal limits  BASIC METABOLIC PANEL - Abnormal; Notable for the following:    Glucose, Bld 129 (*)    GFR calc non Af Amer 66 (*)    GFR calc Af Amer 76 (*)    All other components within normal limits  TROPONIN I    Imaging Review Dg Chest 2 View  11/13/2013   CLINICAL DATA:  Dizziness, nausea, chest pain  EXAM: CHEST  2 VIEW  COMPARISON:  04/27/2013  FINDINGS: The heart size and mediastinal contours are within normal limits. Both lungs are clear. The visualized skeletal structures are unremarkable.  IMPRESSION: No active cardiopulmonary disease.   Electronically Signed   By: Esperanza Heir M.D.   On: 11/13/2013 10:14   Ct Head Wo Contrast  11/13/2013   CLINICAL DATA:  New onset of dizziness without history of trauma, there is a previous history of vertigo as well.  EXAM: CT HEAD WITHOUT CONTRAST  TECHNIQUE: Contiguous axial  images were obtained from the base of the skull through the vertex without intravenous contrast.  COMPARISON:  None.  FINDINGS: The ventricles are normal in size and position. There is very mild diffuse cerebral atrophy. There is no shift of the midline. The cerebellum and brainstem exhibit no acute abnormalities. There is no evidence of an evolving ischemic infarction. There is no intracranial hemorrhage.  At bone window settings the observed portions of the paranasal sinuses are clear with the exception of the right sphenoid sinus where an air-fluid level is present.  IMPRESSION: 1. There is no acute abnormality of the brain. There is no evidence of an acute intracranial hemorrhage. 2. There is an air-fluid level in the left sphenoid sinus suspicious for acute inflammation.   Electronically Signed   By:  David  [place]   On: 11/13/2013 13:08    EKG Interpretation    Date/Time:  Thursday November 13 2013 09:11:40 EST Ventricular Rate:  63 PR Interval:  134 QRS Duration: 88 QT Interval:  452 QTC Calculation: 462 R Axis:   13 Text Interpretation:  Normal sinus rhythm Possible Anterior infarct , age undetermined Q wave in V3 Abnormal ECG When compared with ECG of 27-Apr-2013 17:44, Borderline criteria for Anterior infarct are now Present Borderline criteria for Inferior infarct are now Present Confirmed by Kionna Brier  MD, Allante Beane ([id number]) on 11/13/2013 9:39:27 AM            MDM  No diagnosis found.  Patient presents with chest pain, nausea, and dizziness onset this morning. She does have a history of dizziness and vertigo. She also has risk factors for ACS including diabetes and hyperlipidemia. She is not orthostatic on exam. Screening EKG is reassuring. Chest x-ray is within normal limits. CT scan of the head is negative. Initial troponin is negative.  Patient continues to complain of dizziness. she was given a normal saline bolus and meclizine with some improvement.  She states that she has had several recurrences of her chest pain.     Given recurrent chest pain and risk factors for ACS and continued dizziness, I feel the patient should be admitted for serial enzymes and symptomatic control.    I personally performed the services described in this documentation, which was scribed in my presence. The recorded information has been reviewed and is accurate.    Shon Baton, MD 11/13/13 [phone]

## 2013-11-13 NOTE — ED Notes (Signed)
Patient with c/o sudden onset dizziness and nausea that started around 0904 this morning while standing in line at a store. H/o DM. Has not checked Blood sugar this morning. Patient with h/o vertigo as well. States one episode of chest pain around 0800, sharp in nature to middle of chest, denies radiation of pain that stopped 1-2 minutes after onset. Denies current chest pain. Alert/oriented x 4. Speech clear. Grips equal bilaterally, but weak.

## 2013-11-14 ENCOUNTER — Encounter (HOSPITAL_COMMUNITY): Payer: Self-pay | Admitting: Adult Health

## 2013-11-14 DIAGNOSIS — I2510 Atherosclerotic heart disease of native coronary artery without angina pectoris: Secondary | ICD-10-CM | POA: Insufficient documentation

## 2013-11-14 DIAGNOSIS — R072 Precordial pain: Secondary | ICD-10-CM

## 2013-11-14 LAB — GLUCOSE, CAPILLARY: Glucose-Capillary: 98 mg/dL (ref 70–99)

## 2013-11-14 LAB — TROPONIN I: Troponin I: <0.3 ng/mL (ref <0.30)

## 2013-11-14 MED ORDER — HYDROCODONE-ACETAMINOPHEN 5-325 MG PO TABS: 1.0000 | ORAL_TABLET | Freq: Four times a day (QID) | ORAL | Status: DC | PRN

## 2013-11-14 MED ORDER — ATORVASTATIN CALCIUM 20 MG PO TABS: 20.0000 mg | ORAL_TABLET | Freq: Every day | ORAL | Status: DC

## 2013-11-14 MED ORDER — ALPRAZOLAM 0.25 MG PO TABS: 0.2500 mg | ORAL_TABLET | Freq: Two times a day (BID) | ORAL | Status: DC | PRN

## 2013-11-14 MED ORDER — ASPIRIN 325 MG PO TABS: 325.0000 mg | ORAL_TABLET | Freq: Every day | ORAL | Status: DC

## 2013-11-14 MED ORDER — LEVOFLOXACIN 750 MG PO TABS: 750.0000 mg | ORAL_TABLET | Freq: Every day | ORAL | Status: DC

## 2013-11-14 MED ORDER — LEVOFLOXACIN 750 MG PO TABS
750.0000 mg | ORAL_TABLET | Freq: Every day | ORAL | Status: DC
  Administered 2013-11-14: 750 mg via ORAL
  Filled 2013-11-14: qty 1

## 2013-11-14 NOTE — Discharge Summary (Signed)
Physician Discharge Summary  Alexis Rios ZOX:[id number] DOB: 10-09-1946 DOA: 11/13/2013  PCP: Alexis Mulder, MD  Admit date: 11/13/2013 Discharge date: 11/14/2013  Time spent: 45 minutes  Recommendations for Outpatient Follow-up:  1. PCP in 1 week for evaluation of symptoms  Discharge Diagnoses:  Principal Problem:   Chest pain Active Problems:   Dyslipidemia   GERD (gastroesophageal reflux disease)   Intermittent lightheadedness   Discharge Condition: stable  Diet recommendation: carb modified  Filed Weights   11/13/13 0923  Weight: 98.431 kg (217 lb)    History of present illness:  Alexis Rios is a 67 y.o. female with a Past Medical History of chronic pain syndrome on Vicodin, depression, irritable bowel syndrome, diabetes, gastroesophageal reflux disease who presented on 11/13/13 with the above noted complaint. Per patient she was in her usual state of health until the morning when she started having sharp retrosternal chest pains without any radiation. Pain lasted a few minutes, there was no associated nausea vomiting, shortness of breath or diaphoresis. One hour after the pain, patient started experiencing nausea and "dizziness". Upon asking whether the room spins around her-patient describes the "dizziness" as mostly lightheadedness. Since this was persistent, she presented to the ED. In the ED she was not found to be orthostatic, CT of the head was negative. Admitted this patient for further evaluation and treatment.  During evaluation, patient appeared very comfortable. She claimed that her dizziness was significantly better and very minimal. She was no longer having chest pain.   Hospital Course:  Chest pain  Atypical, likely gastroesophageal reflux disease.  Pt with significant risk factors and episodic chest pains in the past admitted for observation and rule out. No events on tele, cardiac enzymes negative.She has has prior studies in the past including a  holter with no significant arrhythmias. Per cardiology, her EKG showed sinus rhythm with inferior Q waves with no evidence of acute ischemia, the inferior Q-waves do not appear to be present on prior EKG from 08/2013. Echo obtained and yielded mild LVH with EF 65% and no diastolic dysfunction. Will continue on aspirin at discharge and statin.Pain not likely cardiac related.     . Intermittent lightheadedness  - This is been a recurrent issue for the patient, from outpatient [facility] cardiology notes- patient has had a 48 hour Holter monitor which is apparently negative. CT of the head is negative for acute brain abnormality but concerning for sinus infection. Orthostatics were negative. In the past, episodic lightheadedness has been attribute it to patient being on multiple medications-narcotics, Topamax, Neurontin and benzodiazepines. Given meclizine in ED and much improved. Will start levaquin for sinus infection. Also reduced home pain med and benzo.  Recommend follow up with PCP 1 week for evaluation of symptoms . Patient had PT evaluation, outpatient physical therapy would be very by case management . . back pain/neck pain  - Chronic issue, with decreased the frequency of Vicodin. Remained stable, no focal neurological deficits on exam   . GERD (gastroesophageal reflux disease)  - stable during this hospitalizaiton  . History of COPD  - Stable during this hospitalization. .   Procedures: Consultations:  cardiology  Discharge Exam: Filed Vitals:   11/14/13 1421  BP: 114/49  Pulse: 74  Temp: 97.5 F (36.4 C)  Resp: 20    General: calm obese NAD Cardiovascular: RRR No MGR No pitting edema Respiratory: normal effort BS clear bilaterally  No wheeze or rhochi  Discharge Instructions     Medication List  albuterol (2.5 MG/3ML) 0.083% nebulizer solution  Commonly known as:  PROVENTIL  Take 2.5 mg by nebulization every 6 (six) hours as needed for shortness of breath.      albuterol 108 (90 BASE) MCG/ACT inhaler  Commonly known as:  PROVENTIL HFA;VENTOLIN HFA  Inhale 2 puffs into the lungs every 6 (six) hours as needed. For shortness of breath     ALPRAZolam 0.25 MG tablet  Commonly known as:  XANAX  Take 1 tablet (0.25 mg total) by mouth 2 (two) times daily as needed for anxiety.     aspirin 325 MG tablet  Take 1 tablet (325 mg total) by mouth daily.     atorvastatin 20 MG tablet  Commonly known as:  LIPITOR  Take 1 tablet (20 mg total) by mouth daily at 6 PM.     citalopram 40 MG tablet  Commonly known as:  CELEXA  Take 40 mg by mouth daily.     DULERA 200-5 MCG/ACT Aero  Generic drug:  mometasone-formoterol  Take 2 puffs by mouth 2 (two) times daily.     gabapentin 300 MG capsule  Commonly known as:  NEURONTIN  Take 300 mg by mouth 3 (three) times daily.     HYDROcodone-acetaminophen 5-325 MG per tablet  Commonly known as:  NORCO/VICODIN  Take 1 tablet by mouth every 6 (six) hours as needed for moderate pain.     levofloxacin 750 MG tablet  Commonly known as:  LEVAQUIN  Take 1 tablet (750 mg total) by mouth daily.     metFORMIN 500 MG 24 hr tablet  Commonly known as:  GLUCOPHAGE-XR  Take 2 tablets by mouth Twice daily.     methocarbamol 500 MG tablet  Commonly known as:  ROBAXIN  Take 1 tablet (500 mg total) by mouth every 6 (six) hours as needed.     pantoprazole 40 MG tablet  Commonly known as:  PROTONIX  Take 40 mg by mouth 2 (two) times daily.     topiramate 100 MG tablet  Commonly known as:  TOPAMAX  Take 100 mg by mouth at bedtime.       Allergies  Allergen Reactions  . Sulfa Antibiotics Swelling    Mouth and throat   . Codeine Itching  . Cymbalta [Duloxetine Hcl] Other (See Comments)    Cannot urinate  . Keflet [Cephalexin Monohydrate] Itching  . Macrodantin [Nitrofurantoin Macrocrystal] Nausea And Vomiting  . Tape Rash   Follow-up Information   Follow up with Alexis Mulder, MD. Schedule an appointment as  soon as possible for a visit in 1 week. (evaluation of symptoms)    Specialty:  Radiology   Contact information:   [address] [place] [address] [phone]        The results of significant diagnostics from this hospitalization (including imaging, microbiology, ancillary and laboratory) are listed below for reference.    Significant Diagnostic Studies: Dg Chest 2 View  11/13/2013   CLINICAL DATA:  Dizziness, nausea, chest pain  EXAM: CHEST  2 VIEW  COMPARISON:  04/27/2013  FINDINGS: The heart size and mediastinal contours are within normal limits. Both lungs are clear. The visualized skeletal structures are unremarkable.  IMPRESSION: No active cardiopulmonary disease.   Electronically Signed   By: Esperanza Heir M.D.   On: 11/13/2013 10:14   Ct Head Wo Contrast  11/13/2013   CLINICAL DATA:  New onset of dizziness without history of trauma, there is a previous history of vertigo as well.  EXAM: CT  HEAD WITHOUT CONTRAST  TECHNIQUE: Contiguous axial images were obtained from the base of the skull through the vertex without intravenous contrast.  COMPARISON:  None.  FINDINGS: The ventricles are normal in size and position. There is very mild diffuse cerebral atrophy. There is no shift of the midline. The cerebellum and brainstem exhibit no acute abnormalities. There is no evidence of an evolving ischemic infarction. There is no intracranial hemorrhage.  At bone window settings the observed portions of the paranasal sinuses are clear with the exception of the right sphenoid sinus where an air-fluid level is present.  IMPRESSION: 1. There is no acute abnormality of the brain. There is no evidence of an acute intracranial hemorrhage. 2. There is an air-fluid level in the left sphenoid sinus suspicious for acute inflammation.   Electronically Signed   By: David  [place]   On: 11/13/2013 13:08    Microbiology: No results found for this or any previous visit (from the past 240 hour(s)).    Labs: Basic Metabolic Panel:  Recent Labs Lab 11/13/13 0943  NA 137  K 3.5  CL 102  CO2 28  GLUCOSE 129*  BUN 11  CREATININE 0.88  CALCIUM 8.6   Liver Function Tests: No results found for this basename: AST, ALT, ALKPHOS, BILITOT, PROT, ALBUMIN,  in the last 168 hours No results found for this basename: LIPASE, AMYLASE,  in the last 168 hours No results found for this basename: AMMONIA,  in the last 168 hours CBC:  Recent Labs Lab 11/13/13 0943  WBC 6.1  NEUTROABS 2.3  HGB 11.3*  HCT 35.7*  MCV 90.8  PLT 255   Cardiac Enzymes:  Recent Labs Lab 11/13/13 0943 11/13/13 1757 11/13/13 2303 11/14/13 0540  TROPONINI <0.30 <0.30 0.30* <0.30   BNP: BNP (last 3 results) No results found for this basename: PROBNP,  in the last 8760 hours CBG:  Recent Labs Lab 11/13/13 0926 11/13/13 2153 11/14/13 0733 11/14/13 1126  GLUCAP 121* 114* 98 94       Signed:  Toya Smothers NP  [facility] 11/14/2013, 3:16 PM   Attending Patient seen and examined, agree with the above assessment and plan. Much better today, no further chest pain or dizziness. Seen by physical therapy, who patient physical therapy has been recommended. This will be arranged by case management. 2-D echocardiogram shows normal ejection fraction. Seen by cardiology and cleared for discharge as well.She claims to have some right ear fullness, review of the CT scan of the head shows left sphenoid sinusitis, will try a short course of antibiotics to see if this will alleviate some of her dizziness/lightheadedness.  Discharge plan was discussed with patient and husband at bedside, the agreeable.   Windell Norfolk MD

## 2013-11-14 NOTE — Evaluation (Signed)
Physical Therapy Evaluation Patient Details Name: Alexis Rios MRN: [id number] DOB: 1946-08-25 Today's Date: 11/14/2013 Time: 1410-1437 PT Time Calculation (min): 27 min  PT Assessment / Plan / Recommendation History of Present Illness  Pt is admitted with chest pain and dizziness.  She has chronic back pain with multiple spinal surgeries, DM, GERD and is morbidly obese.  She lives with her husband and is very sedenary at baseline.  Clinical Impression  Pt is seen for evaluation.  She reports no chest pain and only very mild lightheadedness which is present at the same intensity and constantly no matter which position she is in.  She states that this lightheadedness is resolving over time.  Pt does report that she "staggers" frequently at home and has had numerous falls.  She has had multiple spinal surgeries and she does have LE weakness.  Her gait pattern is abnormal and is much improved with the use of a walker.  She has one at home and I have recommended that she use it.  She would benefit from OP PT for generalized strengthening and balance intervention.    PT Assessment  All further PT needs can be met in the next venue of care    Follow Up Recommendations  Outpatient PT    Does the patient have the potential to tolerate intense rehabilitation      Barriers to Discharge        Equipment Recommendations  None recommended by PT    Recommendations for Other Services     Frequency      Precautions / Restrictions Precautions Precautions: None Restrictions Weight Bearing Restrictions: No   Pertinent Vitals/Pain       Mobility  Bed Mobility Bed Mobility: Supine to Sit Supine to Sit: 6: Modified independent (Device/Increase time);HOB elevated Transfers Transfers: Sit to Stand;Stand to Sit Sit to Stand: 6: Modified independent (Device/Increase time);From bed Stand to Sit: 6: Modified independent (Device/Increase time);To bed Ambulation/Gait Ambulation/Gait Assistance:  6: Modified independent (Device/Increase time) Ambulation Distance (Feet): 150 Feet Assistive device: Rolling walker;None Gait Pattern: Step-through pattern;Decreased stance time - left;Decreased hip/knee flexion - right;Lateral trunk lean to right General Gait Details: gait rhythm was very stable with a walker and this has been recommended for the pt to use at home Stairs: No Wheelchair Mobility Wheelchair Mobility: No    Exercises     PT Diagnosis: Abnormality of gait;Generalized weakness  PT Problem List: Decreased mobility;Decreased strength PT Treatment Interventions:       PT Goals(Current goals can be found in the care plan section) Acute Rehab PT Goals PT Goal Formulation: No goals set, d/c therapy  Visit Information  Last PT Received On: 11/14/13 History of Present Illness: Pt is admitted with chest pain and dizziness.  She has chronic back pain with multiple spinal surgeries, DM, GERD and is morbidly obese.  She lives with her husband and is very sedenary at baseline.       Prior Functioning  Home Living Family/patient expects to be discharged to:: Private residence Available Help at Discharge: Family;Available 24 hours/day Type of Home: House Home Access: Stairs to enter [employer] of Steps: 1 Entrance Stairs-Rails: None Home Layout: One level Home Equipment: Walker - 2 wheels;Cane - single point;Bedside commode;Shower seat Prior Function Level of Independence: Independent with assistive device(s) Communication Communication: No difficulties    Cognition  Cognition Arousal/Alertness: Awake/alert Behavior During Therapy: WFL for tasks assessed/performed Overall Cognitive Status: Within Functional Limits for tasks assessed    Extremity/Trunk Assessment Lower  Extremity Assessment Lower Extremity Assessment: Generalized weakness (significant weakness in gastrocnemeii) Cervical / Trunk Assessment Cervical / Trunk Assessment: Kyphotic;Other  exceptions Cervical / Trunk Exceptions: may have a scoliosis with left pelvis elevated.Marland KitchenMarland KitchenLLE is externally rotated   Balance Balance Balance Assessed: Yes Dynamic Standing Balance Dynamic Standing - Balance Support: No upper extremity supported Dynamic Standing - Level of Assistance: 5: Stand by assistance Standardized Balance Assessment Standardized Balance Assessment: Vestibular Evaluation (pt would not benefit from vestibular exercise/habituation)  End of Session PT - End of Session Equipment Utilized During Treatment: Gait belt Activity Tolerance: Patient tolerated treatment well Patient left: in bed  GP Functional Assessment Tool Used: clinical judgement Functional Limitation: Mobility: Walking and moving around Mobility: Walking and Moving Around Current Status ([id number]): At least 1 percent but less than 20 percent impaired, limited or restricted Mobility: Walking and Moving Around Goal Status [phone]): At least 1 percent but less than 20 percent impaired, limited or restricted Mobility: Walking and Moving Around Discharge Status [phone]): At least 1 percent but less than 20 percent impaired, limited or restricted   Konrad Penta 11/14/2013, 3:50 PM

## 2013-11-14 NOTE — Consult Note (Signed)
CARDIOLOGY CONSULT NOTE   Patient ID: Alexis Rios MRN: [id number] DOB/AGE: 69-Oct-1947 69 y.o.  Admit Date: 11/13/2013 Referring Physician: PTH Primary Physician: Alexis Mulder, MD Consulting Cardiologist: Alexis Rich MD Primary Cardiologist: Alexis Pates MD Reason for Consultation: Chest Pain with dizziness  Clinical Summary Ms. Alexis Rios is a 67 y.o.female morbidly obese, admitted with recurrent chest pain and dizziness.. She has a history of chronic pain syndrome on vicodin.. Most recent cardiac cath in 2009 mid AV groove circumflex 30%, mid RCA 20%, with dobutamine stress test in 2013 normal. Symptoms of chest pain and dizziness at that time were thought be related to severe deconditioning, morbid obesity, and DDD of the cervical spine, with use of opioids.    She states that the main reason she came in was dizziness and nausea that did not get better on its own. No vomiting.Marland Kitchen She was found to be mildly hypotensive on ER evaluation 104/48. CT scan demonstrated air-fluid level in the left sphenoid sinus suspicious for acute inflammation. CXR negative for CHF or pneumonia. She was treated with antivert, zofran and ASA. Echo is in process. Troponin is negative X 4. Inferior Q-waves are noted. Orthostatics were negative.   She admits to noncompliance with CPAP, stating that she doesn't think she needs it. She denies medical noncompliance. Last dose of Vicodin, day of admission, and states she only take it once or twice a week.     Allergies  Allergen Reactions  . Sulfa Antibiotics Swelling    Mouth and throat   . Codeine Itching  . Cymbalta [Duloxetine Hcl] Other (See Comments)    Cannot urinate  . Keflet [Cephalexin Monohydrate] Itching  . Macrodantin [Nitrofurantoin Macrocrystal] Nausea And Vomiting  . Tape Rash    Medications Scheduled Medications: . aspirin  325 mg Oral Daily  . citalopram  40 mg Oral Daily  . enoxaparin (LOVENOX) injection  40 mg Subcutaneous  Q24H  . gabapentin  300 mg Oral TID  . influenza vac split quadrivalent PF  0.5 mL Intramuscular Tomorrow-1000  . mometasone-formoterol  2 puff Inhalation BID  . pantoprazole  40 mg Oral BID  . sodium chloride  3 mL Intravenous Q12H  . topiramate  100 mg Oral QHS        PRN Medications: acetaminophen, acetaminophen, albuterol, albuterol, ALPRAZolam, HYDROcodone-acetaminophen, ondansetron (ZOFRAN) IV, ondansetron   Past Medical History  Diagnosis Date  . Diabetes mellitus   . Asthma   . Chronic back pain   . Hypercholesteremia   . Seasonal allergies   . S/P cardiac catheterization     LHC 03/2008: Mid AV groove circumflex 30%, mid RCA 20%, EF 60%  . H/O echocardiogram     Echo 09/2011: Mild LVH, EF 60-65%, grade 1 diastolic dysfunction, trivial AI, mild MR, mild LAE.  . H/O exercise stress test     Dobutamine Myoview 09/2011: No scar or ischemia, no EKG changes, study not gated.  . Skin cancer     scc/bcc  . Anxiety   . Arthritis   . COPD (chronic obstructive pulmonary disease)   . Depression   . History of gallstones   . IBS (irritable bowel syndrome)   . History of pneumonia   . Carpal tunnel syndrome   . Diverticulosis   . GERD (gastroesophageal reflux disease)   . Subclinical hypothyroidism   . Peripheral neuropathy   . Osteoporosis   . Gastroparesis     ?  . HH (hiatus hernia)   . Complication of anesthesia  if no breathing tx before anesthesia wakes with asthma  . Pneumonia   . Sleep apnea     cpap wwith O2 63yrs  . UTI (urinary tract infection)   . Stones in the urinary tract   . Chronic headaches     migraines  . Dyspnea     Cardiopulmonary stress test 2013: Normal functional capacity when compared to matched sedentary norms; obesity and deconditioning primary factors contributing to overall limitation; ?OHS.  Marland Kitchen CAD (coronary artery disease)     Cath 2009, AV groove CX 30%, RCA 20 %    Past Surgical History  Procedure Laterality Date  .  Cholecystectomy    . Tonsillectomy    . Shoulder surg      x2 R  . Back surgery      x3  . Knee surgery      R  . Foot surgery      R  . Wrist surgery      L  . Appendectomy    . Abdominal hysterectomy      partial  . Breast surgery      lump x 2 R  . Toe amputation      right  . Umbilical hernia repair    . Nose surgery      skin cancer excision  . Back surgery      Family History  Problem Relation Age of Onset  . Allergies Daughter   . Liver cancer Brother   . Leukemia Brother   . Emphysema Brother   . Diabetes Other     all brothers and sister x 5  . Heart disease Mother   . Heart disease Father   . Stroke Father     Social History Ms. Alexis Rios reports that she has never smoked. She has never used smokeless tobacco. Ms. Alexis Rios reports that she does not drink alcohol.  Review of Systems Otherwise reviewed and negative except as outlined.  Physical Examination Blood pressure 105/57, pulse 66, temperature 97.7 F (36.5 C), temperature source Oral, resp. rate 20, height 5' (1.524 m), weight 217 lb (98.431 kg), SpO2 91.00%.  Intake/Output Summary (Last 24 hours) at 11/14/13 0852 Last data filed at 11/13/13 1930  Gross per 24 hour  Intake    240 ml  Output      0 ml  Net    240 ml    Telemetry: NSR  HEENT: Conjunctiva and lids normal, oropharynx clear with moist mucosa. Neck: Supple, no elevated JVP or carotid bruits, no thyromegaly. Lungs: Clear to auscultation, nonlabored breathing at rest. Cardiac: Regular rate and rhythm, no S3 or significant systolic murmur, no pericardial rub. Abdomen: Soft, nontender, no hepatomegaly, bowel sounds present, no guarding or rebound. Extremities: No pitting edema, distal pulses 2+. Skin: Warm and dry. Musculoskeletal: No kyphosis. Neuropsychiatric: Alert and oriented x3, affect grossly appropriate.  Prior Cardiac Testing/Procedures 1. Holter Monitor: 10/16/2013    SR with rates between 46-131 bpm. Average HR 67 bpm.  Rare PAC;s and Occ PVC's. Small bursts of PAT 3-6 beats).   2. Echo: 09/2011   Mild LVH, EF of 60-65%, Grade 1 diastolic dysfunction, Trival AI, mild MR, mild LAE.   3. Cardiac stress test 2013:   Normal functional capacity when compared to matched sedentary norma. Obesity and deconditioning primary factors contributing to overall limitation. Question OHS.  4. Cardiac Stress Myoview Dobutamine Myoview 09/2011: No scar or ischemia, no EKG changes, study not gated.  5. Cardiac Cath 03/2008 Left main was  long, angiographically normal.  LAD was a moderate-sized vessel which tapered distally gave off 2  diagonals, was angiographically normal.  Left circumflex gave off larger OM-1 and OM-2 and had 30% stenosis in  the mid AV groove circ, otherwise normal.  Right coronary artery was large dominant vessel gave off an RV Antwoine Zorn,  PDA, and posterolateral. There was 20% plaque in the mid RCA.  Left ventriculogram showed an ejection fraction of 60%. There was no  regional wall motion abnormalities. No trace mitral regurgitation.  ASSESSMENT:  1. Minimal nonobstructive coronary artery disease.  2. Normal left ventricular function.  PLAN: Given her angiogram, I doubt her chest pain is cardiac in nature.  We will need to continue risk factor management.   Lab Results  Basic Metabolic Panel:  Recent Labs Lab 11/13/13 0943  NA 137  K 3.5  CL 102  CO2 28  GLUCOSE 129*  BUN 11  CREATININE 0.88  CALCIUM 8.6    Liver Function Tests: No results found for this basename: AST, ALT, ALKPHOS, BILITOT, PROT, ALBUMIN,  in the last 168 hours  CBC:  Recent Labs Lab 11/13/13 0943  WBC 6.1  NEUTROABS 2.3  HGB 11.3*  HCT 35.7*  MCV 90.8  PLT 255    Cardiac Enzymes:  Recent Labs Lab 11/13/13 0943 11/13/13 1757 11/13/13 2303 11/14/13 0540  TROPONINI <0.30 <0.30 0.30* <0.30     Radiology: Dg Chest 2 View  11/13/2013   CLINICAL DATA:  Dizziness, nausea, chest pain  EXAM: CHEST  2  VIEW  COMPARISON:  04/27/2013  FINDINGS: The heart size and mediastinal contours are within normal limits. Both lungs are clear. The visualized skeletal structures are unremarkable.  IMPRESSION: No active cardiopulmonary disease.   Electronically Signed   By: Esperanza Heir M.D.   On: 11/13/2013 10:14   Ct Head Wo Contrast  11/13/2013   CLINICAL DATA:  New onset of dizziness without history of trauma, there is a previous history of vertigo as well.  EXAM: CT HEAD WITHOUT CONTRAST  TECHNIQUE: Contiguous axial images were obtained from the base of the skull through the vertex without intravenous contrast.  COMPARISON:  None.  FINDINGS: The ventricles are normal in size and position. There is very mild diffuse cerebral atrophy. There is no shift of the midline. The cerebellum and brainstem exhibit no acute abnormalities. There is no evidence of an evolving ischemic infarction. There is no intracranial hemorrhage.  At bone window settings the observed portions of the paranasal sinuses are clear with the exception of the right sphenoid sinus where an air-fluid level is present.  IMPRESSION: 1. There is no acute abnormality of the brain. There is no evidence of an acute intracranial hemorrhage. 2. There is an air-fluid level in the left sphenoid sinus suspicious for acute inflammation.   Electronically Signed   By: David  [place]   On: 11/13/2013 13:08     ECG: NSR rate of 623 bpm. Q-waves noted inferiorly.   Impression and Recommendations:  1. Dizziness: Multifactorial. She has been having symptoms of dizziness for several years. Thought to be related to medications, RE: vicodin, but she states she does not take it often. CT scan demonstrates abnormal, with fluid filled sphenoid sinus on the left. HR is low normal. She has responded to Antivert. Review of records reveals that she has had a recent Holter monitor in Nov of 2014 with average HR of 67 bpm, with some elevations in HR with brief PAT noted. She is  not  on any AV nodal blocking agents. HR elevation could be related to use of inhaled steroids, deconditioning. Echo is being performed presently. If normal, doubt further cardiac work up is necessary at this time.    2. CAD: Most recent cardiac cath 2009, non-obstructive disease at that time. Follow up stress test in 2012 and 2013 negative for ischemia. She will continue on risk management treatment. Do not see that she is on a statin. Most recent lipid profile in 8/ 2013 demonstrated LDL of 114, with total cholesterol of 187. Recommend checking status, and beginning low dose atorvastatin 20 mg daily.  3. COPD with OSA: She admits to being non-compliant with CPAP. States her oxygen level has been normal at home and therefore she does not think she needs it. Recommend that she use this as directed at night due to her OSA with obesity. Can potentially cause cardiac arrhythmias RE: afib, which may or may not be the case in this setting.   4. Diabetes:  CVRF for CAD.  Management per PTH.         Signed: Bettey Mare. Lyman Bishop NP [facility] 11/14/2013, 8:52 AM Co-Sign MD  Attending Note Patient seen and discussed with NP Lyman Bishop. 67 yo female with multiple comorbidities including DM, HL, asthma, OSA, and anxiety/ depression admitted with chest pain and dizziness. In regards to the chest pain, she has a long history of prior episodes of chest pain with negative work up previously including a cath in 2009 with patent coronaries, and multiple stress tests without evidence of ischemia. This episode of pain was described as a 3/10 sharp pain in mid chest with no other associated symptoms, lasted for 2 minutes. Nothing made better or worst. She cannot recall if this is similar to her prior chest pain. She states that about 1 hour later she became very dizzy and nauseous. This this episode she did not have any pain, no palpitations, no diaphoresis. Symptoms lasted for several hours, reports improvement  with meclizine. She has has prior studies in the past including a holter with no significant arrhythmias. Her EKG showed sinus rhythm with inferior Q waves with no evidence of acute ischemia, the inferior Q-waves do not appear to be present on prior EKG from 08/2013. Her troponins have all been negative. Telemetry shows no significant arrhythmias. Dizziness does not appear to be cardiac, there was some evidence of sinusitis on head CT and this could be related, she is also on quite a few meds at home that can cause dizziness including neurontin, xanax, celexa. The chest pain episode is atypical for cardiac chest pain, however her EKG suggests that she may have had a prior inferior wall MI the fairly recent past as these findings were not seen in 08/2013 EKG. Will follow up echo for LV function and wall motion to correlate with EKG. Please check fasting lipid panel as she has DM and based on 07/2012 her LDL was not at goal, I do not see that she is on a statin. Continue aspirin.   Alexis Rich MD

## 2013-11-14 NOTE — Progress Notes (Signed)
Pt verbalized understanding of d/c instructions, medication changes as well as new meds, and necessary follow up appts. Pts IV was d/c without complications. Pt has no questions at this time. Atorvastatin was ordered, but pt states she is unable to take cholesterol medication d/t side effects. Toya Smothers, NP made aware of this. Pt has no questions at this time. Pt d/c via wheelchair accompanied by myself. Her husband will be escorting her home. Sheryn Bison

## 2013-11-14 NOTE — Care Management Note (Signed)
    Page 1 of 1   11/14/2013     3:54:09 PM   CARE MANAGEMENT NOTE 11/14/2013  Patient:  Alexis Rios, Alexis Rios   Account Number:  [id number]  Date Initiated:  11/14/2013  Documentation initiated by:  Rosemary Holms  Subjective/Objective Assessment:   Pt from home. PT evaluated pt and recommended Outpt PT. Order send down and CM requested OP PT to call to set up appt.     Action/Plan:   Anticipated DC Date:  11/14/2013   Anticipated DC Plan:        DC Planning Services  CM consult      Choice offered to / List presented to:             Status of service:  Completed, signed off Medicare Important Message given?   (If response is "NO", the following Medicare IM given date fields will be blank) Date Medicare IM given:   Date Additional Medicare IM given:    Discharge Disposition:  HOME/SELF CARE  Per UR Regulation:    If discussed at Long Length of Stay Meetings, dates discussed:    Comments:  11/14/13 Ambermarie Honeyman Leanord Hawking RN BSN CM

## 2013-11-14 NOTE — Clinical Social Work Note (Signed)
CSW received consult with no documented reason. Reviewed chart and spoke with MD- no apparent need for CSW. CM referred for medication needs. Will sign off but can be reconsulted if needed.   Derenda Fennel, [place] [phone]

## 2013-11-14 NOTE — Progress Notes (Signed)
Nutrition Brief Note  Patient identified on the Malnutrition Screening Tool (MST) Report  Patient Active Problem List   Diagnosis Date Noted  . Chest pain 11/13/2013  . Intermittent lightheadedness 11/13/2013  . GERD (gastroesophageal reflux disease) 09/09/2012  . Obesity 06/25/2012  . Asthma 05/28/2012  . Diastolic dysfunction 05/28/2012  . OSA (obstructive sleep apnea) 05/10/2012  . Dyspnea 05/07/2012  . Chest pain 08/24/2011  . Dyslipidemia 08/24/2011    Wt Readings from Last 15 Encounters:  11/13/13 217 lb (98.431 kg)  10/06/13 212 lb (96.163 kg)  08/18/13 202 lb (91.627 kg)  04/27/13 200 lb (90.719 kg)  02/07/13 212 lb 11.9 oz (96.5 kg)  02/07/13 212 lb 11.9 oz (96.5 kg)  01/31/13 212 lb 12.8 oz (96.525 kg)  11/01/12 226 lb (102.513 kg)  10/25/12 226 lb (102.513 kg)  09/12/12 235 lb (106.595 kg)  09/09/12 235 lb (106.595 kg)  08/09/12 235 lb (106.595 kg)  07/31/12 236 lb 12.8 oz (107.412 kg)  07/03/12 247 lb (112.038 kg)  06/25/12 255 lb (115.667 kg)    Body mass index is 42.38 kg/(m^2). Patient meets criteria for obesity class III based on current BMI.  Hx of weight loss over past 1.5 years desirable.   Current diet order is  NPO. Labs and medications reviewed.   No nutrition interventions warranted at this time. If nutrition issues arise, please consult RD.   Royann Shivers MS,RD,CSG,LDN Office: [phone] Pager: [phone]

## 2013-11-14 NOTE — Progress Notes (Signed)
*  PRELIMINARY RESULTS* Echocardiogram 2D Echocardiogram has been performed.  Bond Grieshop 11/14/2013, 9:15 AM

## 2013-11-15 LAB — TROPONIN I: Troponin I: <0.3 ng/mL (ref <0.30)

## 2013-11-30 ENCOUNTER — Encounter (HOSPITAL_COMMUNITY): Payer: Self-pay | Admitting: Emergency Medicine

## 2013-11-30 ENCOUNTER — Emergency Department (HOSPITAL_COMMUNITY): Payer: Medicare Other

## 2013-11-30 ENCOUNTER — Emergency Department (HOSPITAL_COMMUNITY)
Admission: EM | Admit: 2013-11-30 | Discharge: 2013-11-30 | Disposition: A | Discharge status: Home / Self Care | Payer: Medicare Other | Attending: Emergency Medicine | Admitting: Emergency Medicine

## 2013-11-30 DIAGNOSIS — Z79899 Other long term (current) drug therapy: Secondary | ICD-10-CM | POA: Insufficient documentation

## 2013-11-30 DIAGNOSIS — Z9089 Acquired absence of other organs: Secondary | ICD-10-CM | POA: Insufficient documentation

## 2013-11-30 DIAGNOSIS — M542 Cervicalgia: Secondary | ICD-10-CM | POA: Insufficient documentation

## 2013-11-30 DIAGNOSIS — F3289 Other specified depressive episodes: Secondary | ICD-10-CM | POA: Insufficient documentation

## 2013-11-30 DIAGNOSIS — J441 Chronic obstructive pulmonary disease with (acute) exacerbation: Secondary | ICD-10-CM | POA: Insufficient documentation

## 2013-11-30 DIAGNOSIS — J40 Bronchitis, not specified as acute or chronic: Secondary | ICD-10-CM

## 2013-11-30 DIAGNOSIS — Z90710 Acquired absence of both cervix and uterus: Secondary | ICD-10-CM | POA: Insufficient documentation

## 2013-11-30 DIAGNOSIS — I2510 Atherosclerotic heart disease of native coronary artery without angina pectoris: Secondary | ICD-10-CM | POA: Insufficient documentation

## 2013-11-30 DIAGNOSIS — M549 Dorsalgia, unspecified: Secondary | ICD-10-CM | POA: Insufficient documentation

## 2013-11-30 DIAGNOSIS — IMO0001 Reserved for inherently not codable concepts without codable children: Secondary | ICD-10-CM | POA: Insufficient documentation

## 2013-11-30 DIAGNOSIS — F329 Major depressive disorder, single episode, unspecified: Secondary | ICD-10-CM | POA: Insufficient documentation

## 2013-11-30 DIAGNOSIS — Z85828 Personal history of other malignant neoplasm of skin: Secondary | ICD-10-CM | POA: Insufficient documentation

## 2013-11-30 DIAGNOSIS — G4730 Sleep apnea, unspecified: Secondary | ICD-10-CM | POA: Insufficient documentation

## 2013-11-30 DIAGNOSIS — Z7982 Long term (current) use of aspirin: Secondary | ICD-10-CM | POA: Insufficient documentation

## 2013-11-30 DIAGNOSIS — M1710 Unilateral primary osteoarthritis, unspecified knee: Secondary | ICD-10-CM | POA: Insufficient documentation

## 2013-11-30 DIAGNOSIS — Z95818 Presence of other cardiac implants and grafts: Secondary | ICD-10-CM | POA: Insufficient documentation

## 2013-11-30 DIAGNOSIS — G8929 Other chronic pain: Secondary | ICD-10-CM | POA: Insufficient documentation

## 2013-11-30 DIAGNOSIS — G43909 Migraine, unspecified, not intractable, without status migrainosus: Secondary | ICD-10-CM | POA: Insufficient documentation

## 2013-11-30 DIAGNOSIS — K219 Gastro-esophageal reflux disease without esophagitis: Secondary | ICD-10-CM | POA: Insufficient documentation

## 2013-11-30 DIAGNOSIS — Z8701 Personal history of pneumonia (recurrent): Secondary | ICD-10-CM | POA: Insufficient documentation

## 2013-11-30 DIAGNOSIS — R5381 Other malaise: Secondary | ICD-10-CM | POA: Insufficient documentation

## 2013-11-30 DIAGNOSIS — Z87440 Personal history of urinary (tract) infections: Secondary | ICD-10-CM | POA: Insufficient documentation

## 2013-11-30 DIAGNOSIS — M1990 Unspecified osteoarthritis, unspecified site: Secondary | ICD-10-CM

## 2013-11-30 DIAGNOSIS — R197 Diarrhea, unspecified: Secondary | ICD-10-CM | POA: Insufficient documentation

## 2013-11-30 DIAGNOSIS — E119 Type 2 diabetes mellitus without complications: Secondary | ICD-10-CM | POA: Insufficient documentation

## 2013-11-30 DIAGNOSIS — F411 Generalized anxiety disorder: Secondary | ICD-10-CM | POA: Insufficient documentation

## 2013-11-30 LAB — BASIC METABOLIC PANEL
BUN: 9 mg/dL (ref 6–23)
Creatinine, Ser: 0.76 mg/dL (ref 0.50–1.10)
GFR calc Af Amer: >90 mL/min (ref >90)
GFR calc non Af Amer: 84 mL/min — ABNORMAL LOW (ref >90)
Glucose, Bld: 108 mg/dL — ABNORMAL HIGH (ref 70–99)
Potassium: 3.6 mEq/L (ref 3.5–5.1)

## 2013-11-30 LAB — CBC WITH DIFFERENTIAL/PLATELET
Basophils Relative: 0 % (ref 0–1)
Eosinophils Absolute: 0.2 10*3/uL (ref 0.0–0.7)
HCT: 36.1 % (ref 36.0–46.0)
Hemoglobin: 11.7 g/dL — ABNORMAL LOW (ref 12.0–15.0)
Lymphs Abs: 2.7 10*3/uL (ref 0.7–4.0)
MCH: 28.8 pg (ref 26.0–34.0)
MCHC: 32.4 g/dL (ref 30.0–36.0)
MCV: 88.9 fL (ref 78.0–100.0)
Monocytes Absolute: 0.9 10*3/uL (ref 0.1–1.0)
Monocytes Relative: 14 % — ABNORMAL HIGH (ref 3–12)
Neutrophils Relative %: 40 % — ABNORMAL LOW (ref 43–77)
RBC: 4.06 MIL/uL (ref 3.87–5.11)

## 2013-11-30 MED ORDER — DM-GUAIFENESIN ER 30-600 MG PO TB12: 1.0000 | ORAL_TABLET | Freq: Two times a day (BID) | ORAL | Status: DC

## 2013-11-30 MED ORDER — HYDROCODONE-ACETAMINOPHEN 5-325 MG PO TABS
1.0000 | ORAL_TABLET | Freq: Once | ORAL | Status: AC
  Administered 2013-11-30: 1 via ORAL
  Filled 2013-11-30: qty 1

## 2013-11-30 MED ORDER — ALBUTEROL SULFATE HFA 108 (90 BASE) MCG/ACT IN AERS: 1.0000 | INHALATION_SPRAY | Freq: Four times a day (QID) | RESPIRATORY_TRACT | Status: DC | PRN

## 2013-11-30 MED ORDER — HYDROCODONE-ACETAMINOPHEN 5-325 MG PO TABS: 1.0000 | ORAL_TABLET | Freq: Four times a day (QID) | ORAL | Status: DC | PRN

## 2013-11-30 NOTE — ED Provider Notes (Signed)
CSN: [id number]     Arrival date & time 11/30/13  1508 History  This chart was scribed for Shelda Jakes, MD by Quintella Reichert, ED scribe.  This patient was seen in room [facility] and the patient's care was started at 4:10 PM.   Chief Complaint  Patient presents with  . Cough    Patient is a 67 y.o. female presenting with cough. The history is provided by the patient. No language interpreter was used.  Cough Cough characteristics:  Productive Sputum characteristics:  Yellow Severity:  Moderate Duration:  3 weeks Timing: persistent. Progression:  Worsening Chronicity:  New Relieved by:  Nothing Worsened by:  Nothing tried Ineffective treatments: antibiotics. Associated symptoms: chills, myalgias (legs), rhinorrhea and shortness of breath   Associated symptoms: no chest pain, no fever, no headaches and no rash     HPI Comments: ANNE BOLTZ is a 67 y.o. female with h/o COPD, DM, and asthma who presents to the Emergency Department complaining of 3 weeks of persistent worsening URI symptoms.  Pt states she thinks she has bronchitis.  She was admitted to [facility] on 12/4 for CP and evaluation was unremarkable at that admission.  Her CP resolved and she was otherwise in her usual state of health after discharge until 3 weeks ago when she developed cough productive of yellow sputum with associated congestion, SOB, rhinorrhea, and chills.  She has seen her PCP since symptoms began and has been placed on antibiotics without relief.  She denies fevers, abdominal pain, nausea, vomiting,or any other associated symptoms.    Pt also complains of bilateral knee pain with associated swelling.  She states she has diffuse pain throughout her legs but pain is most severe in her knees.  She states her pain makes it difficult for her to walk.  She has attempted to treat pain with 1 hydrocodone with no relief.  She has h/o arthritis and osteoporosis and admits to prior h/o right knee surgery.  She denies  recent injuries.   PCP is Alan Mulder, MD in [place]   Past Medical History  Diagnosis Date  . Diabetes mellitus   . Asthma   . Chronic back pain   . Hypercholesteremia   . Seasonal allergies   . S/P cardiac catheterization     LHC 03/2008: Mid AV groove circumflex 30%, mid RCA 20%, EF 60%  . H/O echocardiogram     Echo 09/2011: Mild LVH, EF 60-65%, grade 1 diastolic dysfunction, trivial AI, mild MR, mild LAE.  . H/O exercise stress test     Dobutamine Myoview 09/2011: No scar or ischemia, no EKG changes, study not gated.  . Skin cancer     scc/bcc  . Anxiety   . Arthritis   . COPD (chronic obstructive pulmonary disease)   . Depression   . History of gallstones   . IBS (irritable bowel syndrome)   . History of pneumonia   . Carpal tunnel syndrome   . Diverticulosis   . GERD (gastroesophageal reflux disease)   . Subclinical hypothyroidism   . Peripheral neuropathy   . Osteoporosis   . Gastroparesis     ?  . HH (hiatus hernia)   . Complication of anesthesia     if no breathing tx before anesthesia wakes with asthma  . Pneumonia   . Sleep apnea     cpap wwith O2 49yrs  . UTI (urinary tract infection)   . Stones in the urinary tract   . Chronic headaches  migraines  . Dyspnea     Cardiopulmonary stress test 2013: Normal functional capacity when compared to matched sedentary norms; obesity and deconditioning primary factors contributing to overall limitation; ?OHS.  Marland Kitchen CAD (coronary artery disease)     Cath 2009, AV groove CX 30%, RCA 20 %    Past Surgical History  Procedure Laterality Date  . Cholecystectomy    . Tonsillectomy    . Shoulder surg      x2 R  . Back surgery      x3  . Knee surgery      R  . Foot surgery      R  . Wrist surgery      L  . Appendectomy    . Abdominal hysterectomy      partial  . Breast surgery      lump x 2 R  . Toe amputation      right  . Umbilical hernia repair    . Nose surgery      skin cancer excision   . Back surgery      Family History  Problem Relation Age of Onset  . Allergies Daughter   . Liver cancer Brother   . Leukemia Brother   . Emphysema Brother   . Diabetes Other     all brothers and sister x 5  . Heart disease Mother   . Heart disease Father   . Stroke Father     History  Substance Use Topics  . Smoking status: Never Smoker   . Smokeless tobacco: Never Used  . Alcohol Use: No    OB History   Grav Para Term Preterm Abortions TAB SAB Ect Mult Living                  Review of Systems  Constitutional: Positive for chills. Negative for fever.  HENT: Positive for congestion and rhinorrhea.   Eyes: Negative for visual disturbance.  Respiratory: Positive for cough and shortness of breath.   Cardiovascular: Negative for chest pain and leg swelling.  Gastrointestinal: Positive for diarrhea (chronic, unchanged). Negative for nausea, vomiting and abdominal pain.  Genitourinary: Negative for dysuria and hematuria.  Musculoskeletal: Positive for arthralgias (knees), back pain (chronic), myalgias (legs) and neck pain (chronic).  Skin: Negative for rash.  Neurological: Negative for headaches.  Hematological: Does not bruise/bleed easily.  Psychiatric/Behavioral: Negative for confusion.     Allergies  Sulfa antibiotics; Codeine; Cymbalta; Keflet; Macrodantin; and Tape  Home Medications   Current Outpatient Rx  Name  Route  Sig  Dispense  Refill  . albuterol (PROVENTIL HFA;VENTOLIN HFA) 108 (90 BASE) MCG/ACT inhaler   Inhalation   Inhale 2 puffs into the lungs every 6 (six) hours as needed. For shortness of breath          . albuterol (PROVENTIL) (2.5 MG/3ML) 0.083% nebulizer solution   Nebulization   Take 2.5 mg by nebulization every 6 (six) hours as needed for shortness of breath.          . ALPRAZolam (XANAX) 0.25 MG tablet   Oral   Take 1 tablet (0.25 mg total) by mouth 2 (two) times daily as needed for anxiety.   30 tablet   0   . aspirin 325  MG tablet   Oral   Take 1 tablet (325 mg total) by mouth daily.         . citalopram (CELEXA) 40 MG tablet   Oral   Take 40 mg by mouth daily.          Marland Kitchen  DULERA 200-5 MCG/ACT AERO   Oral   Take 2 puffs by mouth 2 (two) times daily.          Marland Kitchen gabapentin (NEURONTIN) 300 MG capsule   Oral   Take 300 mg by mouth 3 (three) times daily.           Marland Kitchen HYDROcodone-acetaminophen (NORCO/VICODIN) 5-325 MG per tablet   Oral   Take 1 tablet by mouth every 6 (six) hours as needed for moderate pain.   30 tablet   0   . metFORMIN (GLUCOPHAGE-XR) 500 MG 24 hr tablet   Oral   Take 1,000 tablets by mouth Twice daily.          . methocarbamol (ROBAXIN) 500 MG tablet   Oral   Take 1 tablet (500 mg total) by mouth every 6 (six) hours as needed.   40 tablet   1   . pantoprazole (PROTONIX) 40 MG tablet   Oral   Take 40 mg by mouth 2 (two) times daily.         Marland Kitchen topiramate (TOPAMAX) 100 MG tablet   Oral   Take 100 mg by mouth at bedtime.         Marland Kitchen albuterol (PROVENTIL HFA;VENTOLIN HFA) 108 (90 BASE) MCG/ACT inhaler   Inhalation   Inhale 1-2 puffs into the lungs every 6 (six) hours as needed for wheezing or shortness of breath.   1 Inhaler   2   . dextromethorphan-guaiFENesin (MUCINEX DM) 30-600 MG per 12 hr tablet   Oral   Take 1 tablet by mouth 2 (two) times daily.   14 tablet   1   . HYDROcodone-acetaminophen (NORCO/VICODIN) 5-325 MG per tablet   Oral   Take 1-2 tablets by mouth every 6 (six) hours as needed for moderate pain.   20 tablet   0    BP 125/57  Pulse 81  Temp(Src) 98.2 F (36.8 C) (Oral)  Resp 15  Ht 5' (1.524 m)  Wt 205 lb (92.987 kg)  BMI 40.04 kg/m2  SpO2 99%  Physical Exam  Nursing note and vitals reviewed. Constitutional: She is oriented to person, place, and time. She appears well-developed and well-nourished. No distress.  HENT:  Head: Normocephalic and atraumatic.  Mouth/Throat: Mucous membranes are not dry.  Eyes: EOM are normal.   Neck: Neck supple. No tracheal deviation present.  Cardiovascular: Normal rate, regular rhythm and normal heart sounds.   No murmur heard. Pulmonary/Chest: Effort normal and breath sounds normal. No respiratory distress. She has no wheezes. She has no rales.  Abdominal: Bowel sounds are normal. There is no tenderness.  Musculoskeletal: Normal range of motion.  Right leg and knee warmer than left Right knee more swollen than left, no effusion Left knee has minimal swelling and warmth No erythema on either leg or knee  Neurological: She is alert and oriented to person, place, and time.  Skin: Skin is warm and dry.  Psychiatric: She has a normal mood and affect. Her behavior is normal.    ED Course  Procedures (including critical care time)  DIAGNOSTIC STUDIES: Oxygen Saturation is 99% on room air, normal by my interpretation.    COORDINATION OF CARE: 4:25 PM-Discussed treatment plan which includes CXR with pt at bedside and pt agreed to plan.    Labs Review Labs Reviewed  CBC WITH DIFFERENTIAL - Abnormal; Notable for the following:    Hemoglobin 11.7 (*)    Neutrophils Relative % 40 (*)    Monocytes Relative  14 (*)    All other components within normal limits  BASIC METABOLIC PANEL - Abnormal; Notable for the following:    Glucose, Bld 108 (*)    GFR calc non Af Amer 84 (*)    All other components within normal limits   Results for orders placed during the hospital encounter of 11/30/13  CBC WITH DIFFERENTIAL      Result Value Range   WBC 6.3  4.0 - 10.5 K/uL   RBC 4.06  3.87 - 5.11 MIL/uL   Hemoglobin 11.7 (*) 12.0 - 15.0 g/dL   HCT 62.1  30.8 - 65.7 %   MCV 88.9  78.0 - 100.0 fL   MCH 28.8  26.0 - 34.0 pg   MCHC 32.4  30.0 - 36.0 g/dL   RDW 84.6  96.2 - 95.2 %   Platelets 233  150 - 400 K/uL   Neutrophils Relative % 40 (*) 43 - 77 %   Neutro Abs 2.5  1.7 - 7.7 K/uL   Lymphocytes Relative 43  12 - 46 %   Lymphs Abs 2.7  0.7 - 4.0 K/uL   Monocytes Relative 14 (*)  3 - 12 %   Monocytes Absolute 0.9  0.1 - 1.0 K/uL   Eosinophils Relative 3  0 - 5 %   Eosinophils Absolute 0.2  0.0 - 0.7 K/uL   Basophils Relative 0  0 - 1 %   Basophils Absolute 0.0  0.0 - 0.1 K/uL  BASIC METABOLIC PANEL      Result Value Range   Sodium 140  135 - 145 mEq/L   Potassium 3.6  3.5 - 5.1 mEq/L   Chloride 106  96 - 112 mEq/L   CO2 25  19 - 32 mEq/L   Glucose, Bld 108 (*) 70 - 99 mg/dL   BUN 9  6 - 23 mg/dL   Creatinine, Ser 8.41  0.50 - 1.10 mg/dL   Calcium 8.9  8.4 - 32.4 mg/dL   GFR calc non Af Amer 84 (*) >90 mL/min   GFR calc Af Amer >90  >90 mL/min     Imaging Review Dg Chest 2 View  11/30/2013   CLINICAL DATA:  Cough, congestion  EXAM: CHEST  2 VIEW  COMPARISON:  11/13/2013  FINDINGS: Mild bibasilar opacities, likely atelectasis. No pleural effusion or pneumothorax.  Cardiomegaly.  Mild degenerative changes of the visualized thoracolumbar spine. Lumbar spine fixation hardware, incompletely visualized.  Cholecystectomy clips.  IMPRESSION: Mild bibasilar opacities, likely atelectasis.   Electronically Signed   By: Charline Bills M.D.   On: 11/30/2013 17:27   Dg Knee Complete 4 Views Left  11/30/2013   CLINICAL DATA:  Cough, weakness  EXAM: LEFT KNEE - COMPLETE 4+ VIEW  COMPARISON:  None.  FINDINGS: No fracture or dislocation is seen.  Mild tricompartmental degenerative changes.  No suprapatellar knee joint effusion.  IMPRESSION: No fracture or dislocation is seen.  Mild degenerative changes.   Electronically Signed   By: Charline Bills M.D.   On: 11/30/2013 17:28   Dg Knee Complete 4 Views Right  11/30/2013   CLINICAL DATA:  Cough, weakness  EXAM: RIGHT KNEE - COMPLETE 4+ VIEW  COMPARISON:  None.  FINDINGS: No fracture or dislocation is seen.  Moderate tricompartmental degenerative changes.  No suprapatellar knee joint effusion.  IMPRESSION: No fracture or dislocation is seen.  Moderate tricompartmental degenerative changes.   Electronically Signed   By:  Charline Bills M.D.   On: 11/30/2013 17:28  EKG Interpretation   None       MDM   1. Bronchitis   2. Arthritis    Patient most likely with a persistent bronchitis following an upper respiratory infection around Thanksgiving time frame. Today's x-ray without any evidence of pneumonia pulmonary edema or pneumothorax. Patient also does not have a leukocytosis. Patient's knee complaint is most likely arthritic in nature degenerative changes are more prevalent on the right side and that's where most of her pain is. We'll continue with pain medication she was only taking 1 tablet of hydrocodone can take 2 every 6 hours. Patient will need to followup with her record Dr. For the bronchitis we'll start albuterol inhaler Mucinex DM and in addition the hydrocodone we'll help suppress the cough. Patient has no hypoxia room air sats are 99%. Patient in no acute distress.    I personally performed the services described in this documentation, which was scribed in my presence. The recorded information has been reviewed and is accurate.     Shelda Jakes, MD 11/30/13 [phone]

## 2013-11-30 NOTE — ED Notes (Signed)
Pt c/o cough that is productive with yellow sputum, bilateral leg pain and weakness, states that she was seen in er three weeks ago with admission to hospital, went home and started having problems with walking after getting home, pt reports that onset of weakness in legs and cough was at least two weeks ago,

## 2013-11-30 NOTE — ED Notes (Signed)
Pt c/o cough and weakness x 3 weeks. Pt states she has had increased weakness and trouble walking x 3 days.

## 2014-11-25 ENCOUNTER — Emergency Department (HOSPITAL_COMMUNITY)
Admission: EM | Admit: 2014-11-25 | Discharge: 2014-11-25 | Disposition: A | Discharge status: Home / Self Care | Payer: Commercial Managed Care - HMO | Attending: Emergency Medicine | Admitting: Emergency Medicine

## 2014-11-25 ENCOUNTER — Emergency Department (HOSPITAL_COMMUNITY): Payer: Commercial Managed Care - HMO

## 2014-11-25 ENCOUNTER — Encounter (HOSPITAL_COMMUNITY): Payer: Self-pay | Admitting: *Deleted

## 2014-11-25 DIAGNOSIS — K219 Gastro-esophageal reflux disease without esophagitis: Secondary | ICD-10-CM | POA: Diagnosis not present

## 2014-11-25 DIAGNOSIS — G8929 Other chronic pain: Secondary | ICD-10-CM | POA: Insufficient documentation

## 2014-11-25 DIAGNOSIS — G4730 Sleep apnea, unspecified: Secondary | ICD-10-CM | POA: Insufficient documentation

## 2014-11-25 DIAGNOSIS — Z8701 Personal history of pneumonia (recurrent): Secondary | ICD-10-CM | POA: Insufficient documentation

## 2014-11-25 DIAGNOSIS — Y9289 Other specified places as the place of occurrence of the external cause: Secondary | ICD-10-CM | POA: Insufficient documentation

## 2014-11-25 DIAGNOSIS — S4991XA Unspecified injury of right shoulder and upper arm, initial encounter: Secondary | ICD-10-CM | POA: Diagnosis present

## 2014-11-25 DIAGNOSIS — S50311A Abrasion of right elbow, initial encounter: Secondary | ICD-10-CM | POA: Insufficient documentation

## 2014-11-25 DIAGNOSIS — W19XXXA Unspecified fall, initial encounter: Secondary | ICD-10-CM

## 2014-11-25 DIAGNOSIS — S6991XA Unspecified injury of right wrist, hand and finger(s), initial encounter: Secondary | ICD-10-CM | POA: Insufficient documentation

## 2014-11-25 DIAGNOSIS — Z79899 Other long term (current) drug therapy: Secondary | ICD-10-CM | POA: Insufficient documentation

## 2014-11-25 DIAGNOSIS — I2510 Atherosclerotic heart disease of native coronary artery without angina pectoris: Secondary | ICD-10-CM | POA: Diagnosis not present

## 2014-11-25 DIAGNOSIS — Z87440 Personal history of urinary (tract) infections: Secondary | ICD-10-CM | POA: Diagnosis not present

## 2014-11-25 DIAGNOSIS — M1990 Unspecified osteoarthritis, unspecified site: Secondary | ICD-10-CM | POA: Diagnosis not present

## 2014-11-25 DIAGNOSIS — Y998 Other external cause status: Secondary | ICD-10-CM | POA: Diagnosis not present

## 2014-11-25 DIAGNOSIS — W1839XA Other fall on same level, initial encounter: Secondary | ICD-10-CM | POA: Insufficient documentation

## 2014-11-25 DIAGNOSIS — F419 Anxiety disorder, unspecified: Secondary | ICD-10-CM | POA: Diagnosis not present

## 2014-11-25 DIAGNOSIS — E119 Type 2 diabetes mellitus without complications: Secondary | ICD-10-CM | POA: Diagnosis not present

## 2014-11-25 DIAGNOSIS — Z7982 Long term (current) use of aspirin: Secondary | ICD-10-CM | POA: Diagnosis not present

## 2014-11-25 DIAGNOSIS — G629 Polyneuropathy, unspecified: Secondary | ICD-10-CM | POA: Insufficient documentation

## 2014-11-25 DIAGNOSIS — Z9981 Dependence on supplemental oxygen: Secondary | ICD-10-CM | POA: Insufficient documentation

## 2014-11-25 DIAGNOSIS — F329 Major depressive disorder, single episode, unspecified: Secondary | ICD-10-CM | POA: Insufficient documentation

## 2014-11-25 DIAGNOSIS — Z87442 Personal history of urinary calculi: Secondary | ICD-10-CM | POA: Insufficient documentation

## 2014-11-25 DIAGNOSIS — R52 Pain, unspecified: Secondary | ICD-10-CM

## 2014-11-25 DIAGNOSIS — Y9389 Activity, other specified: Secondary | ICD-10-CM | POA: Insufficient documentation

## 2014-11-25 MED ORDER — HYDROCODONE-ACETAMINOPHEN 5-325 MG PO TABS: 1.0000 | ORAL_TABLET | Freq: Four times a day (QID) | ORAL | Status: DC | PRN

## 2014-11-25 NOTE — ED Notes (Signed)
Pt fell on curb at 1200. States pain to left arm, mostly to left wrist and hand area. States she did not get dizzy or pass out. States she is unsure how she fell, stating she did not stumble.

## 2014-11-25 NOTE — ED Provider Notes (Signed)
CSN: [id number]     Arrival date & time 11/25/14  1245 History  This chart was scribe for Carmin Muskrat, MD by Judithann Sauger, ED Scribe. The patient was seen in room [facility] and the patient's care was started at 1:18 PM.   Chief Complaint  Patient presents with  . Arm Injury   The history is provided by the patient. No language interpreter was used.   HPI Comments: Alexis Rios is a 68 y.o. female with chronic neck issues and chronic who presents to the Emergency Department status post fall that occurred 12:00 pm today. She states that when she fell, she fell on asphalt. She denies hitting her head and LOC. She reports an associated right wrist, right arm, and right elbow pain. She denies dizziness, lightheadedness, and N/V/D. She denies taking any blood thinners. No medication taken this for for pain relief. No other injuries, no other complaints.  Past Medical History  Diagnosis Date  . Diabetes mellitus   . Asthma   . Chronic back pain   . Hypercholesteremia   . Seasonal allergies   . S/P cardiac catheterization     LHC 03/2008: Mid AV groove circumflex 30%, mid RCA 20%, EF 60%  . H/O echocardiogram     Echo 09/2011: Mild LVH, EF 38-18%, grade 1 diastolic dysfunction, trivial AI, mild MR, mild LAE.  . H/O exercise stress test     Dobutamine Myoview 09/2011: No scar or ischemia, no EKG changes, study not gated.  . Skin cancer     scc/bcc  . Anxiety   . Arthritis   . COPD (chronic obstructive pulmonary disease)   . Depression   . History of gallstones   . IBS (irritable bowel syndrome)   . History of pneumonia   . Carpal tunnel syndrome   . Diverticulosis   . GERD (gastroesophageal reflux disease)   . Subclinical hypothyroidism   . Peripheral neuropathy   . Osteoporosis   . Gastroparesis     ?  . HH (hiatus hernia)   . Complication of anesthesia     if no breathing tx before anesthesia wakes with asthma  . Pneumonia   . Sleep apnea     cpap wwith O2 78yrs   . UTI (urinary tract infection)   . Stones in the urinary tract   . Chronic headaches     migraines  . Dyspnea     Cardiopulmonary stress test 2013: Normal functional capacity when compared to matched sedentary norms; obesity and deconditioning primary factors contributing to overall limitation; ?OHS.  Marland Kitchen CAD (coronary artery disease)     Cath 2009, AV groove CX 30%, RCA 20 %   Past Surgical History  Procedure Laterality Date  . Cholecystectomy    . Tonsillectomy    . Shoulder surg      x2 R  . Back surgery      x3  . Knee surgery      R  . Foot surgery      R  . Wrist surgery      L  . Appendectomy    . Abdominal hysterectomy      partial  . Breast surgery      lump x 2 R  . Toe amputation      right  . Umbilical hernia repair    . Nose surgery      skin cancer excision  . Back surgery     Family History  Problem Relation Age of Onset  .  Allergies Daughter   . Liver cancer Brother   . Leukemia Brother   . Emphysema Brother   . Diabetes Other     all brothers and sister x 5  . Heart disease Mother   . Heart disease Father   . Stroke Father    History  Substance Use Topics  . Smoking status: Never Smoker   . Smokeless tobacco: Never Used  . Alcohol Use: No   OB History    No data available     Review of Systems  Constitutional: Negative for appetite change.  Gastrointestinal: Negative for nausea, vomiting and diarrhea.  Musculoskeletal: Positive for arthralgias (right wrist, right arm, right elbow.).  Neurological: Negative for dizziness, syncope and light-headedness.      Allergies  Sulfa antibiotics; Codeine; Cymbalta; Keflet; Macrodantin; and Tape  Home Medications   Prior to Admission medications   Medication Sig Start Date End Date Taking? Authorizing Provider  albuterol (PROVENTIL HFA;VENTOLIN HFA) 108 (90 BASE) MCG/ACT inhaler Inhale 2 puffs into the lungs every 6 (six) hours as needed. For shortness of breath     Historical Provider, MD   albuterol (PROVENTIL HFA;VENTOLIN HFA) 108 (90 BASE) MCG/ACT inhaler Inhale 1-2 puffs into the lungs every 6 (six) hours as needed for wheezing or shortness of breath. 11/30/13   Fredia Sorrow, MD  albuterol (PROVENTIL) (2.5 MG/3ML) 0.083% nebulizer solution Take 2.5 mg by nebulization every 6 (six) hours as needed for shortness of breath.     Historical Provider, MD  ALPRAZolam Duanne Moron) 0.25 MG tablet Take 1 tablet (0.25 mg total) by mouth 2 (two) times daily as needed for anxiety. 11/14/13   Radene Gunning, NP  aspirin 325 MG tablet Take 1 tablet (325 mg total) by mouth daily. 11/14/13   Radene Gunning, NP  citalopram (CELEXA) 40 MG tablet Take 40 mg by mouth daily.     Historical Provider, MD  dextromethorphan-guaiFENesin (MUCINEX DM) 30-600 MG per 12 hr tablet Take 1 tablet by mouth 2 (two) times daily. 11/30/13   Fredia Sorrow, MD  DULERA 200-5 MCG/ACT AERO Take 2 puffs by mouth 2 (two) times daily.  08/11/11   Historical Provider, MD  gabapentin (NEURONTIN) 300 MG capsule Take 300 mg by mouth 3 (three) times daily.      Historical Provider, MD  HYDROcodone-acetaminophen (NORCO/VICODIN) 5-325 MG per tablet Take 1 tablet by mouth every 6 (six) hours as needed for moderate pain. 11/14/13   Radene Gunning, NP  HYDROcodone-acetaminophen (NORCO/VICODIN) 5-325 MG per tablet Take 1-2 tablets by mouth every 6 (six) hours as needed for moderate pain. 11/30/13   Fredia Sorrow, MD  metFORMIN (GLUCOPHAGE-XR) 500 MG 24 hr tablet Take 1,000 tablets by mouth Twice daily.  08/24/11   Historical Provider, MD  methocarbamol (ROBAXIN) 500 MG tablet Take 1 tablet (500 mg total) by mouth every 6 (six) hours as needed. 02/10/13   Olga Coaster Kritzer, MD  pantoprazole (PROTONIX) 40 MG tablet Take 40 mg by mouth 2 (two) times daily.    Historical Provider, MD  topiramate (TOPAMAX) 100 MG tablet Take 100 mg by mouth at bedtime.    Historical Provider, MD   BP 137/73 mmHg  Pulse 62  Temp(Src) 97.9 F (36.6 C) (Oral)  Resp 22   Ht 5' (1.524 m)  Wt 216 lb (97.977 kg)  BMI 42.18 kg/m2  SpO2 94% Physical Exam  Constitutional: She is oriented to person, place, and time. She appears well-developed and well-nourished. No distress.  HENT:  Head:  Normocephalic and atraumatic.  Eyes: Conjunctivae and EOM are normal.  Cardiovascular: Normal rate and regular rhythm.   Pulmonary/Chest: Effort normal and breath sounds normal. No stridor. No respiratory distress.  Abdominal: She exhibits no distension.  Musculoskeletal: She exhibits no edema.  Grip strength change: left is stronger than right.  Pain on dorsal of right hand, no deformity Tenderness to the posterior elbow with superficial abrasions.   Neurological: She is alert and oriented to person, place, and time. No cranial nerve deficit.  Skin: Skin is warm and dry.  Psychiatric: She has a normal mood and affect.  Nursing note and vitals reviewed.   ED Course  Procedures (including critical care time) DIAGNOSTIC STUDIES: Oxygen Saturation is 94% on RA, adequate by my interpretation.    COORDINATION OF CARE: 1:21 PM- Pt advised of plan for treatment and pt agrees.    Labs Review Labs Reviewed - No data to display  Imaging Review Dg Shoulder Right  11/25/2014   CLINICAL DATA:  [place] at home.  Pain  EXAM: RIGHT SHOULDER - 2+ VIEW  COMPARISON:  None.  FINDINGS: Advanced degenerative change in the glenohumeral joint with joint space narrowing and spurring and humeral head deformity. Probable rotator cuff tear with narrowing of the acromial humeral distance. There is resorption of the distal clavicle.  Negative for acute fracture.  IMPRESSION: Chronic degenerative change.  Negative for fracture.   Electronically Signed   By: Franchot Gallo M.D.   On: 11/25/2014 14:31   Dg Elbow Complete Right  11/25/2014   CLINICAL DATA:  [place] at home.  Elbow pain  EXAM: RIGHT ELBOW - COMPLETE 3+ VIEW  COMPARISON:  None.  FINDINGS: There is no evidence of fracture, dislocation, or  joint effusion. There is no evidence of arthropathy or other focal bone abnormality. Soft tissues are unremarkable. Sub cortical lucency of the radial head on the oblique view felt to be a projectional phenomena and not related to fracture.  IMPRESSION: Negative.   Electronically Signed   By: Franchot Gallo M.D.   On: 11/25/2014 14:36   Dg Wrist Complete Right  11/25/2014   CLINICAL DATA:  Fall at home today.  Right wrist and hand pain.  EXAM: RIGHT WRIST - COMPLETE 3+ VIEW  COMPARISON:  Right hand series performed today.  FINDINGS: No acute bony abnormality. Specifically, no fracture, subluxation, or dislocation. Soft tissues are intact. Mild joint space narrowing in the radiocarpal joint.  IMPRESSION: No acute bony abnormality.   Electronically Signed   By: Rolm Baptise M.D.   On: 11/25/2014 14:37   Dg Hand Complete Right  11/25/2014   CLINICAL DATA:  68 year old female with history of trauma from a fall landing on the right hand. Right hand and wrist pain.  EXAM: RIGHT HAND - COMPLETE 3+ VIEW  COMPARISON:  No priors.  FINDINGS: No acute displaced fracture, subluxation or dislocation in the hand. Multifocal joint space narrowing and subchondral sclerosis is noted, most evident at the intercarpal joint between the scaphoid and trapezium, compatible with osteoarthritis. There is irregularity of the distal radius and ulna, likely sequela of remote fractures.  IMPRESSION: 1. No acute displaced fracture in the hand. 2. The appearance of the distal radius and ulna suggests old healed fractures. 3. Degenerative changes of osteoarthritis, as above.   Electronically Signed   By: Vinnie Langton M.D.   On: 11/25/2014 14:35    On repeat exam the patient appears more comfortable.  MDM   Final diagnoses:  Pain  Patient presents after mechanical fall with pain throughout right upper extremity.  Patient is awake and alert, neurologically intact, with no evidence for neurologic or vascular compromise.   Patient's x-rays are reassuring. Patient improved here. With no evidence for acute injury, and no hemodynamic instability, she was discharged in stable condition to follow-up with orthopedics as needed.  I personally performed the services described in this documentation, which was scribed in my presence. The recorded information has been reviewed and is accurate.    Carmin Muskrat, MD 11/25/14 [phone]

## 2014-11-25 NOTE — Discharge Instructions (Signed)
As discussed, it is normal to feel worse in the days immediately following a fall regardless of medication use. ° °However, please take all medication as directed, use ice packs liberally.  If you develop any new, or concerning changes in your condition, please return here for further evaluation and management.   ° °Otherwise, please return followup with your physician ° ° ° °

## 2015-02-25 ENCOUNTER — Other Ambulatory Visit: Payer: Self-pay | Admitting: Neurosurgery

## 2015-02-25 DIAGNOSIS — M5412 Radiculopathy, cervical region: Secondary | ICD-10-CM

## 2015-03-08 ENCOUNTER — Ambulatory Visit
Admission: RE | Admit: 2015-03-08 | Discharge: 2015-03-08 | Disposition: A | Discharge status: Home / Self Care | Payer: Commercial Managed Care - HMO | Source: Ambulatory Visit | Attending: Neurosurgery | Admitting: Neurosurgery

## 2015-03-08 DIAGNOSIS — M5412 Radiculopathy, cervical region: Secondary | ICD-10-CM

## 2015-03-08 MED ORDER — DIAZEPAM 5 MG PO TABS
5.0000 mg | ORAL_TABLET | Freq: Once | ORAL | Status: AC
  Administered 2015-03-08: 5 mg via ORAL

## 2015-03-08 MED ORDER — IOHEXOL 300 MG/ML  SOLN
10.0000 mL | Freq: Once | INTRAMUSCULAR | Status: AC | PRN
  Administered 2015-03-08: 10 mL via INTRATHECAL

## 2015-03-08 MED ORDER — ONDANSETRON HCL 4 MG/2ML IJ SOLN
4.0000 mg | Freq: Once | INTRAMUSCULAR | Status: AC
  Administered 2015-03-08: 4 mg via INTRAMUSCULAR

## 2015-03-08 MED ORDER — ONDANSETRON HCL 4 MG/2ML IJ SOLN: 4.0000 mg | Freq: Four times a day (QID) | INTRAMUSCULAR | Status: DC | PRN

## 2015-03-08 MED ORDER — MEPERIDINE HCL 100 MG/ML IJ SOLN
50.0000 mg | Freq: Once | INTRAMUSCULAR | Status: AC
  Administered 2015-03-08: 50 mg via INTRAMUSCULAR

## 2015-03-08 NOTE — Discharge Instructions (Addendum)
Myelogram Discharge Instructions  1. Go home and rest quietly for the next 24 hours.  It is important to lie flat for the next 24 hours.  Get up only to go to the restroom.  You may lie in the bed or on a couch on your back, your stomach, your left side or your right side.  You may have one pillow under your head.  You may have pillows between your knees while you are on your side or under your knees while you are on your back.  2. DO NOT drive today.  Recline the seat as far back as it will go, while still wearing your seat belt, on the way home.  3. You may get up to go to the bathroom as needed.  You may sit up for 10 minutes to eat.  You may resume your normal diet and medications unless otherwise indicated.  Drink plenty of extra fluids today and tomorrow.  4. The incidence of a spinal headache with nausea and/or vomiting is about 5% (one in 20 patients).  If you develop a headache, lie flat and drink plenty of fluids until the headache goes away.  Caffeinated beverages may be helpful.  If you develop severe nausea and vomiting or a headache that does not go away with flat bed rest, call [phone].  5. You may resume normal activities after your 24 hours of bed rest is over; however, do not exert yourself strongly or do any heavy lifting tomorrow.  6. Call your physician for a follow-up appointment.   You may resume Celexa on Tuesday, March 09, 2015 after 2:00p.m.

## 2015-03-08 NOTE — Progress Notes (Signed)
Pt states she has been off Celexa for the past 2 days. Discharge instructions explained to pt.

## 2015-06-07 ENCOUNTER — Other Ambulatory Visit: Payer: Self-pay

## 2015-06-23 ENCOUNTER — Encounter: Payer: Self-pay | Admitting: *Deleted

## 2015-07-05 ENCOUNTER — Ambulatory Visit (INDEPENDENT_AMBULATORY_CARE_PROVIDER_SITE_OTHER): Payer: Commercial Managed Care - HMO | Admitting: Adult Health

## 2015-07-05 ENCOUNTER — Encounter: Payer: Self-pay | Admitting: Adult Health

## 2015-07-05 VITALS — BP 140/90 | HR 80 | Ht 60.0 in | Wt 216.0 lb

## 2015-07-05 DIAGNOSIS — B372 Candidiasis of skin and nail: Secondary | ICD-10-CM | POA: Diagnosis not present

## 2015-07-05 HISTORY — DX: Candidiasis of skin and nail: B37.2

## 2015-07-05 MED ORDER — MOMETASONE FURO-FORMOTEROL FUM 200-5 MCG/ACT IN AERO: 2.0000 | INHALATION_SPRAY | Freq: Two times a day (BID) | RESPIRATORY_TRACT | Status: DC

## 2015-07-05 MED ORDER — FLUCONAZOLE 150 MG PO TABS: ORAL_TABLET | ORAL | Status: DC

## 2015-07-05 MED ORDER — FLUCONAZOLE 100 MG PO TABS: ORAL_TABLET | ORAL | Status: DC

## 2015-07-05 NOTE — Progress Notes (Addendum)
Subjective:     Patient ID: Alexis Rios, female   DOB: August 04, 1946, 69 y.o.   MRN: [id number]  HPI [name] is a 69 year old white female in complaining of skin fungus for 2 months, has used nystatin cream and 7 days of diflucan 100 mg and then nystatin powders, and it is finally getting better but has some still there, she was treated by Dr Alexis Rios.She says it was sore and cracked and had smell.Also gets under breast but none today.Did spend some time in [place] and sugar has been up.  Review of Systems Patient denies any headaches, hearing loss, fatigue, blurred vision, shortness of breath, chest pain, abdominal pain, problems with bowel movements, urination, or intercourse(not having sex). No joint pain or mood swings.See HPI for positives  Reviewed past medical,surgical, social and family history. Reviewed medications and allergies.     Objective:   Physical Exam BP 140/90 mmHg  Pulse 80  Ht 5' (1.524 m)  Wt 216 lb (97.977 kg)  BMI 42.18 kg/m2   Has skin fungus under panniculus and in groin area, painted with gentian violet.  Discussed keeping clean and dry and try to keep sugars under control.  Assessment:     Skin fungus    Plan:    Continue using nystatin powders Rx diflucan 150 mg Take 1 now and 1 in 3 days #2 with 1 refill, do not take celexa with this Keep clean and dry, use fan or hair dryer on low to dry Can use gentian violet if returns Follow up prn

## 2015-07-05 NOTE — Patient Instructions (Addendum)
Keep area clean and dry Can use gentian violet to skin yeast Yeast Infection of the Skin Some yeast on the skin is normal, but sometimes it causes an infection. If you have a yeast infection, it shows up as white or light brown patches on brown skin. You can see it better in the summer on tan skin. It causes light-colored holes in your suntan. It can happen on any area of the body. This cannot be passed from person to person. HOME CARE  Scrub your skin daily with a dandruff shampoo. Your rash may take a couple weeks to get well.  Do not scratch or itch the rash. GET HELP RIGHT AWAY IF:   You get another infection from scratching. The skin may get warm, red, and may ooze fluid.  The infection does not seem to be getting better. MAKE SURE YOU:  Understand these instructions.  Will watch your condition.  Will get help right away if you are not doing well or get worse. Document Released: 11/09/2008 Document Revised: 02/19/2012 Document Reviewed: 11/09/2008 [facility] Patient Information 2015 [place], [place]. This information is not intended to replace advice given to you by your health care provider. Make sure you discuss any questions you have with your health care provider. Use nystatin powders

## 2016-03-14 ENCOUNTER — Other Ambulatory Visit (HOSPITAL_COMMUNITY): Payer: Self-pay | Admitting: Internal Medicine

## 2016-03-14 DIAGNOSIS — Z1231 Encounter for screening mammogram for malignant neoplasm of breast: Secondary | ICD-10-CM

## 2016-03-17 ENCOUNTER — Ambulatory Visit (HOSPITAL_COMMUNITY)
Admission: RE | Admit: 2016-03-17 | Discharge: 2016-03-17 | Disposition: A | Discharge status: Home / Self Care | Payer: Commercial Managed Care - HMO | Source: Ambulatory Visit | Attending: Internal Medicine | Admitting: Internal Medicine

## 2016-03-17 DIAGNOSIS — Z1231 Encounter for screening mammogram for malignant neoplasm of breast: Secondary | ICD-10-CM | POA: Insufficient documentation

## 2016-06-28 ENCOUNTER — Ambulatory Visit (INDEPENDENT_AMBULATORY_CARE_PROVIDER_SITE_OTHER): Payer: Commercial Managed Care - HMO | Admitting: Allergy and Immunology

## 2016-06-28 ENCOUNTER — Encounter: Payer: Self-pay | Admitting: Allergy and Immunology

## 2016-06-28 VITALS — BP 130/80 | HR 74 | Temp 98.9°F | Resp 16 | Ht 61.0 in | Wt 221.4 lb

## 2016-06-28 DIAGNOSIS — J387 Other diseases of larynx: Secondary | ICD-10-CM | POA: Diagnosis not present

## 2016-06-28 DIAGNOSIS — G4730 Sleep apnea, unspecified: Secondary | ICD-10-CM

## 2016-06-28 DIAGNOSIS — J309 Allergic rhinitis, unspecified: Secondary | ICD-10-CM | POA: Diagnosis not present

## 2016-06-28 DIAGNOSIS — H1010 Acute atopic conjunctivitis, unspecified eye: Secondary | ICD-10-CM | POA: Diagnosis not present

## 2016-06-28 DIAGNOSIS — J4540 Moderate persistent asthma, uncomplicated: Secondary | ICD-10-CM | POA: Diagnosis not present

## 2016-06-28 DIAGNOSIS — K219 Gastro-esophageal reflux disease without esophagitis: Secondary | ICD-10-CM

## 2016-06-28 DIAGNOSIS — F329 Major depressive disorder, single episode, unspecified: Secondary | ICD-10-CM

## 2016-06-28 DIAGNOSIS — F32A Depression, unspecified: Secondary | ICD-10-CM

## 2016-06-28 MED ORDER — MOMETASONE FURO-FORMOTEROL FUM 200-5 MCG/ACT IN AERO: 2.0000 | INHALATION_SPRAY | Freq: Two times a day (BID) | RESPIRATORY_TRACT | Status: DC

## 2016-06-28 MED ORDER — OMEPRAZOLE 40 MG PO CPDR: 40.0000 mg | DELAYED_RELEASE_CAPSULE | Freq: Every day | ORAL | Status: DC

## 2016-06-28 MED ORDER — RANITIDINE HCL 300 MG PO TABS: 300.0000 mg | ORAL_TABLET | Freq: Every day | ORAL | Status: DC

## 2016-06-28 MED ORDER — ALBUTEROL SULFATE HFA 108 (90 BASE) MCG/ACT IN AERS: 2.0000 | INHALATION_SPRAY | RESPIRATORY_TRACT | Status: DC | PRN

## 2016-06-28 NOTE — Progress Notes (Signed)
Follow-up Note  Referring Provider: Celene Squibb, MD Primary Provider: Wende Neighbors, MD Date of Office Visit: 06/28/2016  Subjective:   Alexis Rios (DOB: 1946-03-08) is a 70 y.o. female who returns to the [facility] on 06/28/2016 in re-evaluation of the following:  HPI: Rubby returns to this clinic in reevaluation of her asthma, allergic rhinitis, gastroesophageal reflux disease with LPR, and sleep apnea. I've not seen her in his clinic since May 2015. She has several issues that need to be addressed.  Her asthma appears to be under pretty good control at this point in time. She rarely uses a bronchodilator averaging out to one time per month. She has not required a systemic steroid to treat this condition in over a year. She's been using her Dulera on a regular basis.  Her nose has been doing relatively well and she has not been having any episodes of sinusitis requiring an antibiotic. She does not have any nasal congestion nor anosmia or ugly nasal discharge or headache. She does not use her Flonase.  Her reflux has been out of control with burning in her chest and regurgitation up into her throat on a daily basis. She is not using any proton pump inhibitor or H2 receptor blocker at this point time as she is discontinued these agent sometime over the course of the past year. She does not consume caffeine or eat chocolate that often.  She is not using her CPAP machine. She's not using this machine because she needs to get up 2 or 3 times per night to urinate. She cannot deal with unmasking and re-masking several times a night.  Her depression has really gotten out of control and she contacted her psychiatrist a few weeks ago who increased her antidepressants.    Medication List           albuterol 108 (90 Base) MCG/ACT inhaler  Commonly known as:  PROVENTIL HFA;VENTOLIN HFA  Inhale 2 puffs into the lungs every 6 (six) hours as needed. For shortness of breath     ALPRAZolam 0.25 MG tablet  Commonly known as:  XANAX  Take 1 tablet (0.25 mg total) by mouth 2 (two) times daily as needed for anxiety.     amitriptyline 10 MG tablet  Commonly known as:  ELAVIL  Take 10 mg by mouth at bedtime.     aspirin 325 MG tablet  Take 1 tablet (325 mg total) by mouth daily.     citalopram 40 MG tablet  Commonly known as:  CELEXA  Take 40 mg by mouth daily.     gabapentin 300 MG capsule  Commonly known as:  NEURONTIN  Take 300 mg by mouth 2 (two) times daily.     HYDROcodone-acetaminophen 5-325 MG tablet  Commonly known as:  NORCO/VICODIN  Take 1 tablet by mouth every 6 (six) hours as needed for severe pain.     metFORMIN 500 MG 24 hr tablet  Commonly known as:  GLUCOPHAGE-XR  Take 1,000 tablets by mouth Twice daily.     methocarbamol 500 MG tablet  Commonly known as:  ROBAXIN  Take 1 tablet (500 mg total) by mouth every 6 (six) hours as needed.     mometasone-formoterol 200-5 MCG/ACT Aero  Commonly known as:  DULERA  Inhale 2 puffs into the lungs 2 (two) times daily.     topiramate 100 MG tablet  Commonly known as:  TOPAMAX  Take 100 mg by mouth at bedtime.  Vitamin D (Ergocalciferol) 50000 units Caps capsule  Commonly known as:  DRISDOL        Past Medical History  Diagnosis Date  . Diabetes mellitus   . Asthma   . Chronic back pain   . Hypercholesteremia   . Seasonal allergies   . S/P cardiac catheterization     LHC 03/2008: Mid AV groove circumflex 30%, mid RCA 20%, EF 60%  . H/O echocardiogram     Echo 09/2011: Mild LVH, EF 123456, grade 1 diastolic dysfunction, trivial AI, mild MR, mild LAE.  . H/O exercise stress test     Dobutamine Myoview 09/2011: No scar or ischemia, no EKG changes, study not gated.  . Skin cancer     scc/bcc  . Anxiety   . Arthritis   . COPD (chronic obstructive pulmonary disease) ([place])   . Depression   . History of gallstones   . IBS (irritable bowel syndrome)   . History of pneumonia   .  Carpal tunnel syndrome   . Diverticulosis   . GERD (gastroesophageal reflux disease)   . Subclinical hypothyroidism   . Peripheral neuropathy ([place])   . Osteoporosis   . Gastroparesis     ?  . HH (hiatus hernia)   . Complication of anesthesia     if no breathing tx before anesthesia wakes with asthma  . Pneumonia   . Sleep apnea     cpap wwith O2 41yrs  . UTI (urinary tract infection)   . Stones in the urinary tract   . Chronic headaches     migraines  . Dyspnea     Cardiopulmonary stress test 2013: Normal functional capacity when compared to matched sedentary norms; obesity and deconditioning primary factors contributing to overall limitation; ?OHS.  Marland Kitchen CAD (coronary artery disease)     Cath 2009, AV groove CX 30%, RCA 20 %  . Hypertension   . Anemia   . Candidiasis of skin and nail   . Skin yeast infection 07/05/2015    Past Surgical History  Procedure Laterality Date  . Cholecystectomy    . Tonsillectomy    . Shoulder surg      x2 R  . Back surgery      x3  . Knee surgery      R  . Foot surgery      R  . Wrist surgery      L  . Appendectomy    . Abdominal hysterectomy      partial  . Breast surgery      lump x 2 R  . Toe amputation      right  . Umbilical hernia repair    . Nose surgery      skin cancer excision  . Back surgery      Allergies  Allergen Reactions  . Sulfa Antibiotics Swelling    Mouth and throat   . Keflex [Cephalexin]   . Mucinex [Guaifenesin Er] Other (See Comments)    Urinary retention  . Codeine Itching  . Cymbalta [Duloxetine Hcl] Other (See Comments)    Cannot urinate  . Keflet [Cephalexin Monohydrate] Itching  . Macrodantin [Nitrofurantoin Macrocrystal] Nausea And Vomiting  . Tape Rash    Review of systems negative except as noted in HPI / PMHx or noted below:  Review of Systems  Constitutional: Negative.   HENT: Negative.   Eyes: Negative.   Respiratory: Negative.   Cardiovascular: Negative.   Gastrointestinal:  Negative.   Genitourinary: Negative.  Musculoskeletal: Negative.   Skin: Negative.   Neurological: Negative.        Recent episode of vertigo treated with meclizine by her primary care physician  Endo/Heme/Allergies: Negative.   Psychiatric/Behavioral: Negative.      Objective:   Filed Vitals:   06/28/16 1103  BP: 130/80  Pulse: 74  Temp: 98.9 F (37.2 C)  Resp: 16   Height: 5\' 1"  (154.9 cm)  Weight: 221 lb 6.4 oz (100.426 kg)   Physical Exam  Constitutional: She is well-developed, well-nourished, and in no distress.  HENT:  Head: Normocephalic.  Right Ear: Tympanic membrane, external ear and ear canal normal.  Left Ear: Tympanic membrane, external ear and ear canal normal.  Nose: Nose normal. No mucosal edema or rhinorrhea.  Mouth/Throat: Uvula is midline, oropharynx is clear and moist and mucous membranes are normal. No oropharyngeal exudate.  Midline nasal surgical scar  Eyes: Conjunctivae are normal.  Neck: Trachea normal. No tracheal tenderness present. No tracheal deviation present. No thyromegaly present.  Cardiovascular: Normal rate, regular rhythm, S1 normal, S2 normal and normal heart sounds.   No murmur heard. Pulmonary/Chest: Breath sounds normal. No stridor. No respiratory distress. She has no wheezes. She has no rales.  Musculoskeletal: She exhibits no edema.  Lymphadenopathy:       Head (right side): No tonsillar adenopathy present.       Head (left side): No tonsillar adenopathy present.    She has no cervical adenopathy.  Neurological: She is alert. Gait normal.  Skin: No rash noted. She is not diaphoretic. No erythema. Nails show no clubbing.  Psychiatric: Mood and affect normal.    Diagnostics:    Spirometry was performed and demonstrated an FEV1 of 1.80 at 92 % of predicted.  The patient had an Asthma Control Test with the following results:  .    Assessment and Plan:   1. Asthma, moderate persistent, well-controlled   2. Allergic  rhinoconjunctivitis   3. LPRD (laryngopharyngeal reflux disease)   4. Sleep apnea   5. Depression     1. Continue Dulera 200 - 2 inhalations twice a day  2. Restart omeprazole 40 mg in a.m. plus ranitidine 300 mg in PM  3. Can restart Flonase one-2 sprays each nostril one time per day  4. Can use ProAir HFA and OTC antihistamine if needed  5. Restart CPAP treatment  6. Return to clinic in 4 weeks or earlier if problem  I have encouraged Tamye to readdress her medical issues by restarting treatment for reflux and restarting her treatment for sleep apnea. I suspect that her sleep will probably be less fragmented if she can get her reflux under control and I suspect that her depression will probably get under a little better control if she can provide oxygen to her brain at nighttime by using the CPAP machine. I recommended that she keep her mask on but just disconnect the hose when she needs to go to the bathroom and then she can reconnect the house when she gets back into bed. This will make it a little bit easier for her throughout the night in that she will not need to unmask and re-mask multiple times a night. I would like for her to continue to use therapy directed against asthma which is actually been relatively effective and we'll keep her on Dulera at this point. She will return to this clinic in 4 weeks so we can assess her response to this therapy and she can return to this  clinic sooner should there be a significant problem.  Allena Katz, MD [place]

## 2016-06-28 NOTE — Patient Instructions (Addendum)
  1. Continue Dulera 200 - 2 inhalations twice a day  2. Restart omeprazole 40 mg in a.m. plus ranitidine 300 mg in PM  3. Can restart Flonase one-2 sprays each nostril one time per day  4. Can use ProAir HFA and OTC antihistamine if needed  5. Restart CPAP treatment  6. Return to clinic in 4 weeks or earlier if problem

## 2016-08-02 ENCOUNTER — Ambulatory Visit (INDEPENDENT_AMBULATORY_CARE_PROVIDER_SITE_OTHER): Payer: Commercial Managed Care - HMO | Admitting: Allergy and Immunology

## 2016-08-02 ENCOUNTER — Encounter: Payer: Self-pay | Admitting: Allergy and Immunology

## 2016-08-02 VITALS — BP 120/82 | HR 20 | Resp 20

## 2016-08-02 DIAGNOSIS — K219 Gastro-esophageal reflux disease without esophagitis: Secondary | ICD-10-CM

## 2016-08-02 DIAGNOSIS — J309 Allergic rhinitis, unspecified: Secondary | ICD-10-CM

## 2016-08-02 DIAGNOSIS — H1010 Acute atopic conjunctivitis, unspecified eye: Secondary | ICD-10-CM

## 2016-08-02 DIAGNOSIS — J302 Other seasonal allergic rhinitis: Secondary | ICD-10-CM | POA: Diagnosis not present

## 2016-08-02 DIAGNOSIS — J4540 Moderate persistent asthma, uncomplicated: Secondary | ICD-10-CM | POA: Diagnosis not present

## 2016-08-02 DIAGNOSIS — R42 Dizziness and giddiness: Secondary | ICD-10-CM | POA: Diagnosis not present

## 2016-08-02 DIAGNOSIS — J387 Other diseases of larynx: Secondary | ICD-10-CM | POA: Diagnosis not present

## 2016-08-02 DIAGNOSIS — H1045 Other chronic allergic conjunctivitis: Secondary | ICD-10-CM

## 2016-08-02 DIAGNOSIS — G4730 Sleep apnea, unspecified: Secondary | ICD-10-CM | POA: Diagnosis not present

## 2016-08-02 MED ORDER — SUCRALFATE 1 G PO TABS: 1.0000 g | ORAL_TABLET | Freq: Four times a day (QID) | ORAL | 4 refills | Status: DC

## 2016-08-02 NOTE — Patient Instructions (Addendum)
  1. Continue Dulera 200 - 2 inhalations twice a day  2. Continue omeprazole 40 mg in a.m. plus ranitidine 300 mg in PM  3. Can add Carafate 1 g tablet 4 times a day to help with reflux  4. Evaluation with gastroenterologist for reflux  5. Continue Flonase one-2 sprays each nostril one time per day  6. Can use ProAir HFA and OTC antihistamine if needed  7. Continue CPAP treatment  8. Continue meclizine if needed  9. Evaluation with ENT for vertigo and hearing loss  10. Return to clinic in 12 weeks or earlier if problem  11. Obtain fall flu vaccine

## 2016-08-02 NOTE — Progress Notes (Signed)
Follow-up Note  Referring Provider: Celene Squibb, MD Primary Provider: Wende Neighbors, MD Date of Office Visit: 08/02/2016  Subjective:   Alexis Rios (DOB: 10-19-1946) is a 70 y.o. female who returns to [facility] on 08/02/2016 in re-evaluation of the following:  HPI: Jlah presents to this clinic in evaluation of her asthma, allergic rhinoconjunctivitis, LPR, sleep apnea, and issues with vertigo. I last saw her in his clinic on 06/28/2016 at which time we attempted to address the issue with her very severe LPR and reflux and addressed her atopic respiratory issue.  Although her atopic respiratory issue is going quite well and she does not need to use a short acting bronchodilator and has no issues with shortness of breath or wheezing or coughing or nasal congestion or ugly nasal discharge, she has been having problems with vertigo. She's been having vertigo over the course of the past few months for which she'll intermittently take meclizine. She does not have any tinnitus associated with her vertigo but she does feel as though her "ears close up" when she's having vertigo. She had a rather significant episode this Sunday that lasted most of the day. She has not seen an ear nose and throat physician for this issue.  Her reflux is still out of control. She has reflux with regurgitation and burning both during the daytime and nighttime even in the face of utilizing her proton pump inhibitor and H2 receptor blocker. She cannot remember the last time she saw GI although she thinks that was greater than 10 years ago.  She is using her CPAP machine at this point in time although she does need to remove it several times at night to urinate.    Medication List      albuterol 108 (90 Base) MCG/ACT inhaler Commonly known as:  VENTOLIN HFA Inhale 2 puffs into the lungs every 4 (four) hours as needed for wheezing or shortness of breath.   albuterol (2.5 MG/3ML) 0.083%  nebulizer solution Commonly known as:  PROVENTIL Take 2.5 mg by nebulization every 6 (six) hours as needed for shortness of breath.   ALPRAZolam 0.25 MG tablet Commonly known as:  XANAX Take 1 tablet (0.25 mg total) by mouth 2 (two) times daily as needed for anxiety.   amitriptyline 10 MG tablet Commonly known as:  ELAVIL Take 10 mg by mouth at bedtime.   aspirin 325 MG tablet Take 1 tablet (325 mg total) by mouth daily.   citalopram 40 MG tablet Commonly known as:  CELEXA Take 40 mg by mouth daily.   cyanocobalamin 1000 MCG/ML injection Commonly known as:  (VITAMIN B-12)   FLUTICASONE PROPIONATE (NASAL) NA Place into the nose.   gabapentin 300 MG capsule Commonly known as:  NEURONTIN Take 300 mg by mouth 2 (two) times daily.   HYDROcodone-acetaminophen 5-325 MG tablet Commonly known as:  NORCO/VICODIN Take 1 tablet by mouth every 6 (six) hours as needed for severe pain.   meclizine 25 MG tablet Commonly known as:  ANTIVERT Take 1 tablet by mouth every 8 (eight) hours as needed.   metFORMIN 500 MG 24 hr tablet Commonly known as:  GLUCOPHAGE-XR Take 1,000 tablets by mouth Twice daily.   methocarbamol 500 MG tablet Commonly known as:  ROBAXIN Take 1 tablet (500 mg total) by mouth every 6 (six) hours as needed.   mometasone-formoterol 200-5 MCG/ACT Aero Commonly known as:  DULERA Inhale 2 puffs into the lungs 2 (two) times daily.  omeprazole 40 MG capsule Commonly known as:  PRILOSEC Take 1 capsule (40 mg total) by mouth daily.   ranitidine 300 MG tablet Commonly known as:  ZANTAC Take 1 tablet (300 mg total) by mouth daily.   topiramate 100 MG tablet Commonly known as:  TOPAMAX Take 100 mg by mouth at bedtime.   Vitamin D (Ergocalciferol) 50000 units Caps capsule Commonly known as:  DRISDOL       Past Medical History:  Diagnosis Date  . Anemia   . Anxiety   . Arthritis   . Asthma   . CAD (coronary artery disease)    Cath 2009, AV groove CX 30%,  RCA 20 %  . Candidiasis of skin and nail   . Carpal tunnel syndrome   . Chronic back pain   . Chronic headaches    migraines  . Complication of anesthesia    if no breathing tx before anesthesia wakes with asthma  . COPD (chronic obstructive pulmonary disease) ([place])   . Depression   . Diabetes mellitus   . Diverticulosis   . Dyspnea    Cardiopulmonary stress test 2013: Normal functional capacity when compared to matched sedentary norms; obesity and deconditioning primary factors contributing to overall limitation; ?OHS.  . Gastroparesis    ?  Marland Kitchen GERD (gastroesophageal reflux disease)   . H/O echocardiogram    Echo 09/2011: Mild LVH, EF 123456, grade 1 diastolic dysfunction, trivial AI, mild MR, mild LAE.  . H/O exercise stress test    Dobutamine Myoview 09/2011: No scar or ischemia, no EKG changes, study not gated.  . HH (hiatus hernia)   . History of gallstones   . History of pneumonia   . Hypercholesteremia   . Hypertension   . IBS (irritable bowel syndrome)   . Osteoporosis   . Peripheral neuropathy ([place])   . Pneumonia   . S/P cardiac catheterization    LHC 03/2008: Mid AV groove circumflex 30%, mid RCA 20%, EF 60%  . Seasonal allergies   . Skin cancer    scc/bcc  . Skin yeast infection 07/05/2015  . Sleep apnea    cpap wwith O2 59yrs  . Stones in the urinary tract   . Subclinical hypothyroidism   . UTI (urinary tract infection)     Past Surgical History:  Procedure Laterality Date  . ABDOMINAL HYSTERECTOMY     partial  . APPENDECTOMY    . BACK SURGERY     x3  . BACK SURGERY    . BREAST SURGERY     lump x 2 R  . CHOLECYSTECTOMY    . FOOT SURGERY     R  . KNEE SURGERY     R  . NOSE SURGERY     skin cancer excision  . shoulder surg     x2 R  . TOE AMPUTATION     right  . TONSILLECTOMY    . UMBILICAL HERNIA REPAIR    . WRIST SURGERY     L    Allergies  Allergen Reactions  . Sulfa Antibiotics Swelling    Mouth and throat   . Codeine Itching  .  Cymbalta [Duloxetine Hcl] Other (See Comments)    Cannot urinate  . Keflet [Cephalexin Monohydrate] Itching  . Keflex [Cephalexin]   . Macrodantin [Nitrofurantoin Macrocrystal] Nausea And Vomiting  . Mucinex [Guaifenesin Er] Other (See Comments)    Urinary retention  . Tape Rash    Review of systems negative except as noted in HPI /  PMHx or noted below:  Review of Systems  Constitutional: Negative.   HENT: Negative.   Eyes: Negative.   Respiratory: Negative.   Cardiovascular: Negative.   Gastrointestinal: Negative.   Genitourinary: Negative.   Musculoskeletal: Negative.   Skin: Negative.   Neurological: Negative.   Endo/Heme/Allergies: Negative.   Psychiatric/Behavioral: Negative.      Objective:   Vitals:   08/02/16 1056  BP: 120/82  Pulse: (!) 20  Resp: 20          Physical Exam  Constitutional: She is well-developed, well-nourished, and in no distress.  Slightly raspy voice  HENT:  Head: Normocephalic.  Right Ear: Tympanic membrane, external ear and ear canal normal.  Left Ear: Tympanic membrane, external ear and ear canal normal.  Nose: Nose normal. No mucosal edema or rhinorrhea.  Mouth/Throat: Uvula is midline, oropharynx is clear and moist and mucous membranes are normal. No oropharyngeal exudate.  Eyes: Conjunctivae are normal.  Neck: Trachea normal. No tracheal tenderness present. No tracheal deviation present. No thyromegaly present.  Cardiovascular: Normal rate, regular rhythm, S1 normal, S2 normal and normal heart sounds.   No murmur heard. Pulmonary/Chest: Breath sounds normal. No stridor. No respiratory distress. She has no wheezes. She has no rales.  Musculoskeletal: She exhibits no edema.  Lymphadenopathy:       Head (right side): No tonsillar adenopathy present.       Head (left side): No tonsillar adenopathy present.    She has no cervical adenopathy.  Neurological: She is alert. Gait normal.  Skin: No rash noted. She is not diaphoretic. No  erythema. Nails show no clubbing.  Psychiatric: Mood and affect normal.    Diagnostics:    Spirometry was performed and demonstrated an FEV1 of 1.94 at 104 % of predicted.  Assessment and Plan:   1. Asthma, moderate persistent, well-controlled   2. Allergic rhinoconjunctivitis   3. LPRD (laryngopharyngeal reflux disease)   4. Sleep apnea   5. Vertigo     1. Continue Dulera 200 - 2 inhalations twice a day  2. Continue omeprazole 40 mg in a.m. plus ranitidine 300 mg in PM  3. Can add Carafate 1 g tablet 4 times a day to help with reflux  4. Evaluation with gastroenterologist for reflux  5. Continue Flonase one-2 sprays each nostril one time per day  6. Can use ProAir HFA and OTC antihistamine if needed  7. Continue CPAP treatment  8. Continue meclizine if needed  9. Evaluation with ENT for vertigo and hearing loss  10. Return to clinic in 12 weeks or earlier if problem  11. Obtain fall flu vaccine  Although Sylvius respiratory tract issue including her asthma and allergic rhinitis are under good control this point in time she certainly does need attention for some other issues. She appears to have very severe reflux and were going to have her revisit with a gastroenterologist and I've given her some Carafate to use to see if this helps with her reflux over the course the next few weeks. As well, she's having vertigo associated with some hearing abnormalities and I think she needs to be evaluated for possible Mnire's disease by a ENT physician. We'll see if we can get those appointments arranged sometime in the next week or so. Apparently those referrals need to be directed by her primary care physician and will contact them about obtaining those referrals. I will see her back in this clinic in 12 weeks or earlier if there is a problem.  Allena Katz, MD [place]

## 2016-08-21 ENCOUNTER — Emergency Department (HOSPITAL_COMMUNITY)
Admission: EM | Admit: 2016-08-21 | Discharge: 2016-08-21 | Disposition: A | Discharge status: Home / Self Care | Payer: Commercial Managed Care - HMO | Attending: Emergency Medicine | Admitting: Emergency Medicine

## 2016-08-21 ENCOUNTER — Encounter (HOSPITAL_COMMUNITY): Payer: Self-pay | Admitting: Emergency Medicine

## 2016-08-21 DIAGNOSIS — E119 Type 2 diabetes mellitus without complications: Secondary | ICD-10-CM | POA: Diagnosis not present

## 2016-08-21 DIAGNOSIS — Z79899 Other long term (current) drug therapy: Secondary | ICD-10-CM | POA: Insufficient documentation

## 2016-08-21 DIAGNOSIS — E039 Hypothyroidism, unspecified: Secondary | ICD-10-CM | POA: Insufficient documentation

## 2016-08-21 DIAGNOSIS — Z7984 Long term (current) use of oral hypoglycemic drugs: Secondary | ICD-10-CM | POA: Insufficient documentation

## 2016-08-21 DIAGNOSIS — I2510 Atherosclerotic heart disease of native coronary artery without angina pectoris: Secondary | ICD-10-CM | POA: Diagnosis not present

## 2016-08-21 DIAGNOSIS — B372 Candidiasis of skin and nail: Secondary | ICD-10-CM | POA: Insufficient documentation

## 2016-08-21 DIAGNOSIS — J45909 Unspecified asthma, uncomplicated: Secondary | ICD-10-CM | POA: Insufficient documentation

## 2016-08-21 DIAGNOSIS — J449 Chronic obstructive pulmonary disease, unspecified: Secondary | ICD-10-CM | POA: Insufficient documentation

## 2016-08-21 DIAGNOSIS — I10 Essential (primary) hypertension: Secondary | ICD-10-CM | POA: Insufficient documentation

## 2016-08-21 DIAGNOSIS — R21 Rash and other nonspecific skin eruption: Secondary | ICD-10-CM | POA: Diagnosis present

## 2016-08-21 MED ORDER — FLUCONAZOLE 100 MG PO TABS
200.0000 mg | ORAL_TABLET | Freq: Once | ORAL | Status: AC
  Administered 2016-08-21: 200 mg via ORAL

## 2016-08-21 MED ORDER — FLUCONAZOLE 100 MG PO TABS
ORAL_TABLET | ORAL | Status: AC
Start: 2016-08-21 — End: 2016-08-21
  Administered 2016-08-21: 200 mg via ORAL
  Filled 2016-08-21: qty 2

## 2016-08-21 MED ORDER — FLUCONAZOLE 200 MG PO TABS: 200.0000 mg | ORAL_TABLET | ORAL | 0 refills | Status: AC

## 2016-08-21 MED ORDER — FLUCONAZOLE 100 MG PO TABS: 100.0000 mg | ORAL_TABLET | Freq: Once | ORAL | Status: DC

## 2016-08-21 NOTE — ED Notes (Signed)
MD assessed and script given and explained, first dose given in ED, pt and husband understand to take next dose next monday

## 2016-08-21 NOTE — ED Provider Notes (Signed)
[facility] DEPT Provider Note   CSN: [id number] Arrival date & time: 08/21/16  1331     History   Chief Complaint Chief Complaint  Patient presents with  . Rash    HPI Alexis Rios is a 70 y.o. female.  HPI   69yF with rash. She has been using nystatin cream and powder for it. Red, itchy rash beneath b/l breasts, axilla, and groin. She feels like it's not getting any better and is frustrated. No fever or chills. No n/v. No contacts with similar. No respiratory complaints, gi complaints or dizziness/lightheadedness.   Past Medical History:  Diagnosis Date  . Anemia   . Anxiety   . Arthritis   . Asthma   . CAD (coronary artery disease)    Cath 2009, AV groove CX 30%, RCA 20 %  . Candidiasis of skin and nail   . Carpal tunnel syndrome   . Chronic back pain   . Chronic headaches    migraines  . Complication of anesthesia    if no breathing tx before anesthesia wakes with asthma  . COPD (chronic obstructive pulmonary disease) ([place])   . Depression   . Diabetes mellitus   . Diverticulosis   . Dyspnea    Cardiopulmonary stress test 2013: Normal functional capacity when compared to matched sedentary norms; obesity and deconditioning primary factors contributing to overall limitation; ?OHS.  . Gastroparesis    ?  Marland Kitchen GERD (gastroesophageal reflux disease)   . H/O echocardiogram    Echo 09/2011: Mild LVH, EF 123456, grade 1 diastolic dysfunction, trivial AI, mild MR, mild LAE.  . H/O exercise stress test    Dobutamine Myoview 09/2011: No scar or ischemia, no EKG changes, study not gated.  . HH (hiatus hernia)   . History of gallstones   . History of pneumonia   . Hypercholesteremia   . Hypertension   . IBS (irritable bowel syndrome)   . Osteoporosis   . Peripheral neuropathy ([place])   . Pneumonia   . S/P cardiac catheterization    LHC 03/2008: Mid AV groove circumflex 30%, mid RCA 20%, EF 60%  . Seasonal allergies   . Skin cancer    scc/bcc  . Skin yeast  infection 07/05/2015  . Sleep apnea    cpap wwith O2 24yrs  . Stones in the urinary tract   . Subclinical hypothyroidism   . UTI (urinary tract infection)     Patient Active Problem List   Diagnosis Date Noted  . Skin yeast infection 07/05/2015  . CAD (coronary artery disease)   . Chest pain 11/13/2013  . Intermittent lightheadedness 11/13/2013  . GERD (gastroesophageal reflux disease) 09/09/2012  . Obesity 06/25/2012  . Disorder of shoulder 06/04/2012  . Asthma 05/28/2012  . Diastolic dysfunction 123XX123  . OSA (obstructive sleep apnea) 05/10/2012  . Dyspnea 05/07/2012  . Chest pain 08/24/2011  . Dyslipidemia 08/24/2011    Past Surgical History:  Procedure Laterality Date  . ABDOMINAL HYSTERECTOMY     partial  . APPENDECTOMY    . BACK SURGERY     x3  . BACK SURGERY    . BREAST SURGERY     lump x 2 R  . CHOLECYSTECTOMY    . FOOT SURGERY     R  . KNEE SURGERY     R  . NOSE SURGERY     skin cancer excision  . shoulder surg     x2 R  . TOE AMPUTATION     right  .  TONSILLECTOMY    . UMBILICAL HERNIA REPAIR    . WRIST SURGERY     L    OB History    No data available       Home Medications    Prior to Admission medications   Medication Sig Start Date End Date Taking? Authorizing Provider  albuterol (PROVENTIL) (2.5 MG/3ML) 0.083% nebulizer solution Take 2.5 mg by nebulization every 6 (six) hours as needed for shortness of breath.    Yes Historical Provider, MD  albuterol (VENTOLIN HFA) 108 (90 Base) MCG/ACT inhaler Inhale 2 puffs into the lungs every 4 (four) hours as needed for wheezing or shortness of breath. 06/28/16  Yes Jiles Prows, MD  ALPRAZolam Duanne Moron) 0.25 MG tablet Take 1 tablet (0.25 mg total) by mouth 2 (two) times daily as needed for anxiety. Patient taking differently: Take 0.25 mg by mouth 4 (four) times daily.  11/14/13  Yes Lezlie Octave Black, NP  amitriptyline (ELAVIL) 25 MG tablet Take 25 mg by mouth at bedtime.   Yes Historical Provider, MD    buPROPion (WELLBUTRIN) 75 MG tablet Take 75 mg by mouth 2 (two) times daily.   Yes Historical Provider, MD  citalopram (CELEXA) 40 MG tablet Take 40 mg by mouth daily.    Yes Historical Provider, MD  cyanocobalamin (,VITAMIN B-12,) 1000 MCG/ML injection Inject 1,000 mcg into the muscle every 30 (thirty) days.  07/11/16  Yes Historical Provider, MD  gabapentin (NEURONTIN) 300 MG capsule Take 300 mg by mouth 3 (three) times daily.    Yes Historical Provider, MD  metFORMIN (GLUCOPHAGE-XR) 500 MG 24 hr tablet Take 1,000 tablets by mouth Twice daily.  08/24/11  Yes Historical Provider, MD  mometasone-formoterol (DULERA) 200-5 MCG/ACT AERO Inhale 2 puffs into the lungs 2 (two) times daily. 06/28/16  Yes Jiles Prows, MD  nystatin (MYCOSTATIN/NYSTOP) powder Apply 1 g topically 2 (two) times daily.  05/30/16  Yes Historical Provider, MD  omeprazole (PRILOSEC) 40 MG capsule Take 1 capsule (40 mg total) by mouth daily. 06/28/16  Yes Jiles Prows, MD  ranitidine (ZANTAC) 300 MG tablet Take 1 tablet (300 mg total) by mouth daily. 06/28/16  Yes Jiles Prows, MD  sucralfate (CARAFATE) 1 g tablet Take 1 tablet (1 g total) by mouth 4 (four) times daily. 08/02/16  Yes Jiles Prows, MD  topiramate (TOPAMAX) 100 MG tablet Take 100 mg by mouth at bedtime.   Yes Historical Provider, MD  Vitamin D, Ergocalciferol, (DRISDOL) 50000 units CAPS capsule Take 50,000 Units by mouth every 7 (seven) days. Takes on Saturdays. 05/31/16  Yes Historical Provider, MD  fluconazole (DIFLUCAN) 200 MG tablet Take 1 tablet (200 mg total) by mouth once a week. Take one tablet and then the second tablet 1 week later. 08/21/16 08/23/16  Virgel Manifold, MD    Family History Family History  Problem Relation Age of Onset  . Allergies Daughter   . Liver cancer Brother   . Hypertension Brother   . Diabetes Brother   . Leukemia Brother   . Emphysema Brother   . Heart disease Mother   . Heart disease Father   . Stroke Father   . Hypertension  Sister   . Diabetes Sister   . Diabetes Other     all brothers and sister x 5    Social History Social History  Substance Use Topics  . Smoking status: Never Smoker  . Smokeless tobacco: Never Used  . Alcohol use No  Allergies   Sulfa antibiotics; Codeine; Cymbalta [duloxetine hcl]; Keflet [cephalexin monohydrate]; Keflex [cephalexin]; Macrodantin [nitrofurantoin macrocrystal]; Mucinex [guaifenesin er]; and Tape   Review of Systems Review of Systems  All systems reviewed and negative, other than as noted in HPI.   Physical Exam Updated Vital Signs BP 155/80 (BP Location: Left Arm)   Pulse 70   Temp 98 F (36.7 C) (Oral)   Ht 4\' 11"  (1.499 m)   Wt 218 lb (98.9 kg)   SpO2 98%   BMI 44.03 kg/m   Physical Exam  Constitutional: She appears well-developed and well-nourished. No distress.  HENT:  Head: Normocephalic and atraumatic.  Eyes: Conjunctivae are normal. Right eye exhibits no discharge. Left eye exhibits no discharge.  Neck: Neck supple.  Cardiovascular: Normal rate, regular rhythm and normal heart sounds.  Exam reveals no gallop and no friction rub.   No murmur heard. Pulmonary/Chest: Effort normal and breath sounds normal. No respiratory distress.  Abdominal: Soft. She exhibits no distension. There is no tenderness.  Musculoskeletal: She exhibits no edema or tenderness.  Neurological: She is alert.  Skin: Skin is warm and dry. Rash noted.  Red, beefy rash b/l axilla, under folds of pannus and inguinal creases. Macerated appearance. Scattered punctate lesion of more intense erythema.   Psychiatric: She has a normal mood and affect. Her behavior is normal. Thought content normal.  Nursing note and vitals reviewed.    ED Treatments / Results  Labs (all labs ordered are listed, but only abnormal results are displayed) Labs Reviewed - No data to display  EKG  EKG Interpretation None       Radiology No results found.  Procedures Procedures  (including critical care time)  Medications Ordered in ED Medications - No data to display   Initial Impression / Assessment and Plan / ED Course  I have reviewed the triage vital signs and the nursing notes.  Pertinent labs & imaging results that were available during my care of the patient were reviewed by me and considered in my medical decision making (see chart for details).  Clinical Course    70 year old female with rash. Consistent with intertrigo. She has it in her axilla, beneath the folds of her lower abdomen and in the creases of her thighs. She what are probably satellite lesions from Candida. She's been using topical antifungal's. I think it is reasonable to give a couple doses of fluconazole as well. I stressed to her that the biggest help would be try to keep these areas dry the best she can.   She showers daily. Advised that she needs to clean gently and not to scrub or overly irritate. When she gets out of the shower she needs to pay special attention to thoroughly drying these areas and gently using a blow dryer may help. It may help is she can lay out on the bed our couch for a bit and keep these areas exposed to the air before she dresses. Clothing needs to be as loose fitting and breathable as she is comfortable with.   Explained that this is another area of her health that would be improved the better she can control her diabetes. Encouragement provided as sometimes this takes time and diligence to clear up. Return precautions discussed. Outpt FU otherwise.   Final Clinical Impressions(s) / ED Diagnoses   Final diagnoses:  Intertriginous candidiasis    New Prescriptions New Prescriptions   FLUCONAZOLE (DIFLUCAN) 200 MG TABLET    Take 1 tablet (200 mg total)  by mouth once a week. Take one tablet and then the second tablet 1 week later.     Virgel Manifold, MD 08/24/16 1351

## 2016-08-21 NOTE — ED Triage Notes (Signed)
Pt reports rash under skin folds of stomach and in between legs x8 months, pt was given nystatin cream without relief, now given generic cream. Pt also reports cough.

## 2016-10-25 ENCOUNTER — Ambulatory Visit: Payer: Commercial Managed Care - HMO | Admitting: Allergy and Immunology

## 2016-11-01 ENCOUNTER — Ambulatory Visit (INDEPENDENT_AMBULATORY_CARE_PROVIDER_SITE_OTHER): Payer: Commercial Managed Care - HMO | Admitting: Allergy and Immunology

## 2016-11-01 ENCOUNTER — Encounter: Payer: Self-pay | Admitting: Allergy and Immunology

## 2016-11-01 VITALS — BP 142/90 | HR 80 | Resp 16

## 2016-11-01 DIAGNOSIS — G4733 Obstructive sleep apnea (adult) (pediatric): Secondary | ICD-10-CM

## 2016-11-01 DIAGNOSIS — R42 Dizziness and giddiness: Secondary | ICD-10-CM | POA: Diagnosis not present

## 2016-11-01 DIAGNOSIS — J3089 Other allergic rhinitis: Secondary | ICD-10-CM

## 2016-11-01 DIAGNOSIS — J4540 Moderate persistent asthma, uncomplicated: Secondary | ICD-10-CM | POA: Diagnosis not present

## 2016-11-01 DIAGNOSIS — K219 Gastro-esophageal reflux disease without esophagitis: Secondary | ICD-10-CM

## 2016-11-01 MED ORDER — SUCRALFATE 1 G PO TABS: 1.0000 g | ORAL_TABLET | Freq: Four times a day (QID) | ORAL | 1 refills | Status: DC

## 2016-11-01 MED ORDER — OMEPRAZOLE 40 MG PO CPDR: DELAYED_RELEASE_CAPSULE | ORAL | 1 refills | Status: DC

## 2016-11-01 MED ORDER — RANITIDINE HCL 300 MG PO TABS: 300.0000 mg | ORAL_TABLET | Freq: Every day | ORAL | 1 refills | Status: DC

## 2016-11-01 NOTE — Progress Notes (Signed)
Follow-up Note  Referring Provider: Celene Squibb, MD Primary Provider: Wende Neighbors, MD Date of Office Visit: 11/01/2016  Subjective:   Alexis Rios (DOB: [birth year]) is a 70 y.o. female who returns to the [facility] on 11/01/2016 in re-evaluation of the following:  HPI: Alexis Rios returns to this clinic in reevaluation of her asthma and allergic rhinoconjunctivitis and LPR and sleep apnea and vertigo. I last saw her in his clinic in August 2017.  Her respiratory tract issue has been relatively quiet. She does not need to use a bronchodilator greater than 1 time per week and she has not required a systemic steroid or an antibiotic since I've last seen her in his clinic to treat her respiratory tract issue. Her nose is doing quite well. She does not use a short acting bronchodilator.  Her ear issue defined as vertigo and hearing loss has resolved at this point in time. She does have a scheduled appointment with an ENT doctor.  Her reflux issue which was completely out of control when I last saw her in his clinic has responded 95% to the administration of Carafate in addition to her omeprazole and ranitidine. She does have an appointment to see a GI doctor next week.  She continues on CPAP without problem.  She did receive the flu vaccine this year    Medication List      albuterol 108 (90 Base) MCG/ACT inhaler Commonly known as:  VENTOLIN HFA Inhale 2 puffs into the lungs every 4 (four) hours as needed for wheezing or shortness of breath.   albuterol (2.5 MG/3ML) 0.083% nebulizer solution Commonly known as:  PROVENTIL Take 2.5 mg by nebulization every 6 (six) hours as needed for shortness of breath.   ALPRAZolam 0.25 MG tablet Commonly known as:  XANAX Take 1 tablet (0.25 mg total) by mouth 2 (two) times daily as needed for anxiety.   amitriptyline 25 MG tablet Commonly known as:  ELAVIL Take 25 mg by mouth at bedtime.   buPROPion 75 MG tablet Commonly  known as:  WELLBUTRIN Take 75 mg by mouth 2 (two) times daily.   citalopram 40 MG tablet Commonly known as:  CELEXA Take 40 mg by mouth daily.   cyanocobalamin 1000 MCG/ML injection Commonly known as:  (VITAMIN B-12) Inject 1,000 mcg into the muscle every 30 (thirty) days.   gabapentin 300 MG capsule Commonly known as:  NEURONTIN Take 300 mg by mouth 3 (three) times daily.   metFORMIN 500 MG 24 hr tablet Commonly known as:  GLUCOPHAGE-XR Take 1,000 tablets by mouth Twice daily.   mometasone-formoterol 200-5 MCG/ACT Aero Commonly known as:  DULERA Inhale 2 puffs into the lungs 2 (two) times daily.   nystatin powder Commonly known as:  MYCOSTATIN/NYSTOP Apply 1 g topically 2 (two) times daily.   omeprazole 40 MG capsule Commonly known as:  PRILOSEC Take 1 capsule (40 mg total) by mouth daily.   ranitidine 300 MG tablet Commonly known as:  ZANTAC Take 1 tablet (300 mg total) by mouth daily.   sucralfate 1 g tablet Commonly known as:  CARAFATE Take 1 tablet (1 g total) by mouth 4 (four) times daily.   topiramate 100 MG tablet Commonly known as:  TOPAMAX Take 100 mg by mouth at bedtime.   Vitamin D (Ergocalciferol) 50000 units Caps capsule Commonly known as:  DRISDOL Take 50,000 Units by mouth every 7 (seven) days. Takes on Saturdays.       Past Medical History:  Diagnosis Date  . Anemia   . Anxiety   . Arthritis   . Asthma   . CAD (coronary artery disease)    Cath 2009, AV groove CX 30%, RCA 20 %  . Candidiasis of skin and nail   . Carpal tunnel syndrome   . Chronic back pain   . Chronic headaches    migraines  . Complication of anesthesia    if no breathing tx before anesthesia wakes with asthma  . COPD (chronic obstructive pulmonary disease) ([place])   . Depression   . Diabetes mellitus   . Diverticulosis   . Dyspnea    Cardiopulmonary stress test 2013: Normal functional capacity when compared to matched sedentary norms; obesity and deconditioning  primary factors contributing to overall limitation; ?OHS.  . Gastroparesis    ?  Marland Kitchen GERD (gastroesophageal reflux disease)   . H/O echocardiogram    Echo 09/2011: Mild LVH, EF 123456, grade 1 diastolic dysfunction, trivial AI, mild MR, mild LAE.  . H/O exercise stress test    Dobutamine Myoview 09/2011: No scar or ischemia, no EKG changes, study not gated.  . HH (hiatus hernia)   . History of gallstones   . History of pneumonia   . Hypercholesteremia   . Hypertension   . IBS (irritable bowel syndrome)   . Osteoporosis   . Peripheral neuropathy ([place])   . Pneumonia   . S/P cardiac catheterization    LHC 03/2008: Mid AV groove circumflex 30%, mid RCA 20%, EF 60%  . Seasonal allergies   . Skin cancer    scc/bcc  . Skin yeast infection 07/05/2015  . Sleep apnea    cpap wwith O2 23yrs  . Stones in the urinary tract   . Subclinical hypothyroidism   . UTI (urinary tract infection)     Past Surgical History:  Procedure Laterality Date  . ABDOMINAL HYSTERECTOMY     partial  . APPENDECTOMY    . BACK SURGERY     x3  . BACK SURGERY    . BREAST SURGERY     lump x 2 R  . CHOLECYSTECTOMY    . FOOT SURGERY     R  . KNEE SURGERY     R  . NOSE SURGERY     skin cancer excision  . shoulder surg     x2 R  . TOE AMPUTATION     right  . TONSILLECTOMY    . UMBILICAL HERNIA REPAIR    . WRIST SURGERY     L    Allergies  Allergen Reactions  . Sulfa Antibiotics Swelling    Mouth and throat   . Codeine Itching  . Cymbalta [Duloxetine Hcl] Other (See Comments)    Cannot urinate  . Keflet [Cephalexin Monohydrate] Itching  . Keflex [Cephalexin]   . Macrodantin [Nitrofurantoin Macrocrystal] Nausea And Vomiting  . Mucinex [Guaifenesin Er] Other (See Comments)    Urinary retention  . Tape Rash    Review of systems negative except as noted in HPI / PMHx or noted below:  Review of Systems  Constitutional: Negative.   HENT: Negative.   Eyes: Negative.   Respiratory: Negative.     Cardiovascular: Negative.   Gastrointestinal: Negative.   Genitourinary: Negative.   Musculoskeletal: Negative.   Skin: Negative.   Neurological: Negative.   Endo/Heme/Allergies: Negative.   Psychiatric/Behavioral: Negative.      Objective:   Vitals:   11/01/16 1144  BP: (!) 142/90  Pulse: 80  Resp: 16  Physical Exam  Constitutional: She is well-developed, well-nourished, and in no distress.  HENT:  Head: Normocephalic.  Right Ear: Tympanic membrane, external ear and ear canal normal.  Left Ear: Tympanic membrane, external ear and ear canal normal.  Nose: Nose normal. No mucosal edema or rhinorrhea.  Mouth/Throat: Uvula is midline, oropharynx is clear and moist and mucous membranes are normal. No oropharyngeal exudate.  Eyes: Conjunctivae are normal.  Neck: Trachea normal. No tracheal tenderness present. No tracheal deviation present. No thyromegaly present.  Cardiovascular: Normal rate, regular rhythm, S1 normal, S2 normal and normal heart sounds.   No murmur heard. Pulmonary/Chest: Breath sounds normal. No stridor. No respiratory distress. She has no wheezes. She has no rales.  Musculoskeletal: She exhibits no edema.  Lymphadenopathy:       Head (right side): No tonsillar adenopathy present.       Head (left side): No tonsillar adenopathy present.    She has no cervical adenopathy.  Neurological: She is alert. Gait normal.  Skin: No rash noted. She is not diaphoretic. No erythema. Nails show no clubbing.  Psychiatric: Mood and affect normal.    Diagnostics:    Spirometry was performed and demonstrated an FEV1 of 1.94 at 99 % of predicted.  Assessment and Plan:   1. Asthma, moderate persistent, well-controlled   2. Other allergic rhinitis   3. LPRD (laryngopharyngeal reflux disease)   4. Obstructive sleep apnea syndrome   5. Vertigo     1. Continue Dulera 200 - 2 inhalations twice a day  2. Continue omeprazole 40 mg in a.m. plus ranitidine 300 mg  in PM  3. Continue Carafate 1 g tablet 4 times a day    4. Continue Flonase one-2 sprays each nostril one time per day  5. Can use ProAir HFA and OTC antihistamine if needed  6. Continue CPAP treatment  7. Continue meclizine if needed  8. Follow through with ENT and GI evaluation  9. Return to clinic in 12 weeks or earlier if problem   Mariam appears to be doing much better regarding all of her issues. Her respiratory tract appears to be under very good control with her current medical therapy and we'll continue to have her use anti-inflammatory agents for both her upper and lower airway. Her reflux is much better at this point in time on her combination therapy. Her vertigo and hearing loss has basically resolved. I still would like for her to follow-up with ENT and GI evaluation concerning these issues. I will see her back in this clinic in approximately 3 months or earlier if there is a problem.  Allena Katz, MD [place]

## 2016-11-01 NOTE — Patient Instructions (Addendum)
  1. Continue Dulera 200 - 2 inhalations twice a day  2. Continue omeprazole 40 mg in a.m. plus ranitidine 300 mg in PM  3. Continue Carafate 1 g tablet 4 times a day    4. Continue Flonase one-2 sprays each nostril one time per day  5. Can use ProAir HFA and OTC antihistamine if needed  6. Continue CPAP treatment  7. Continue meclizine if needed  8. Follow through with ENT and GI evaluation  9. Return to clinic in 12 weeks or earlier if problem

## 2016-11-07 ENCOUNTER — Encounter: Payer: Self-pay | Admitting: Physician Assistant

## 2016-11-07 ENCOUNTER — Ambulatory Visit (INDEPENDENT_AMBULATORY_CARE_PROVIDER_SITE_OTHER): Payer: Commercial Managed Care - HMO | Admitting: Physician Assistant

## 2016-11-07 VITALS — BP 122/80 | HR 68 | Ht 59.0 in | Wt 220.0 lb

## 2016-11-07 DIAGNOSIS — K219 Gastro-esophageal reflux disease without esophagitis: Secondary | ICD-10-CM

## 2016-11-07 DIAGNOSIS — R079 Chest pain, unspecified: Secondary | ICD-10-CM | POA: Diagnosis not present

## 2016-11-07 NOTE — Patient Instructions (Addendum)
Continue Carafate- makea slurry , between meals and at bedtime.  Continue Ranitidine- 300 mg twice daily.  Continue Omeprazole 40 mg, take 1 tab by mouth daily.   We have provided you with an antireflux diet.   We made you an appointment with Dr.Paula Ross for 1- 10-2017 at 10:00 am.    You have been scheduled for a Barium Esophogram at [facility] on Thursday 11-09-2016 at 9:00 am. Please arrive at 8:45 am  to your appointment for registration. Make certain not to have anything to eat or drink 3 hours prior to your test. If you need to reschedule for any reason, please contact radiology at [phone] to do so. __________________________________________________________________ A barium swallow is an examination that concentrates on views of the esophagus. This tends to be a double contrast exam (barium and two liquids which, when combined, create a gas to distend the wall of the oesophagus) or single contrast (non-ionic iodine based). The study is usually tailored to your symptoms so a good history is essential. Attention is paid during the study to the form, structure and configuration of the esophagus, looking for functional disorders (such as aspiration, dysphagia, achalasia, motility and reflux) EXAMINATION You may be asked to change into a gown, depending on the type of swallow being performed. A radiologist and radiographer will perform the procedure. The radiologist will advise you of the type of contrast selected for your procedure and direct you during the exam. You will be asked to stand, sit or lie in several different positions and to hold a small amount of fluid in your mouth before being asked to swallow while the imaging is performed .In some instances you may be asked to swallow barium coated marshmallows to assess the motility of a solid food bolus. The exam can be recorded as a digital or video fluoroscopy procedure. POST PROCEDURE It will take 1-2 days for the  barium to pass through your system. To facilitate this, it is important, unless otherwise directed, to increase your fluids for the next 24-48hrs and to resume your normal diet.  This test typically takes about 30 minutes to perform. __________________________________________________________________________________

## 2016-11-07 NOTE — Progress Notes (Addendum)
Subjective:    Patient ID: Alexis Rios, female    DOB: 1946-08-13, 70 y.o.   MRN: [id number]  HPI Alexis Rios is a 70 year old white female known to Dr. Hilarie Fredrickson with history of chronic GERD who comes in today to discuss ongoing issues with reflux, GERD and intermittent chest pain. Patient had EGD in November 2013 showing a small hiatal hernia and mild gastritis. Colonoscopy was also done in 2013 finding of one 5 mm polyp in the ascending colon which was a tubular adenoma and small internal hemorrhoids. Does have history of obstructive sleep apnea, morbid obesity, dysfunction, asthma, and coronary artery disease. She has been on omeprazole 40 mg by mouth daily long-term. She states that last year her primary care provider added ranitidine 100 mg which she has been taking twice daily. About 3 months ago she started having symptoms refractory to that regimen particularly with heartburn and indigestion bothering her in the evenings and at nighttime. She says she sometimes regurgitates at night and will be awakened by indigestion. She was started on Carafate 1 g 4 times daily about 2 months ago which she says is definitely helped. She states that she had an episode of chest pain last week while she was at the grocery store and she said felt different and she was worried about her heart. She did not seek medical attention because she says she has had cardiac evaluation in the past though several years ago and told that her chest pain was not coming from her heart. This episode was not associated with weakness shortness of breath or diaphoresis. It resolved after several minutes. She has not had this sort of chest pain recur. On further questioning patient does use Goody powders one or 2 per day and no other regular NSAIDs. She has noted some recent pill dysphagia but has not had any solid food dysphagia.  Review of Systems Pertinent positive and negative review of systems were noted in the above HPI section.   All other review of systems was otherwise negative.  Outpatient Encounter Prescriptions as of 11/07/2016  Medication Sig  . albuterol (PROVENTIL) (2.5 MG/3ML) 0.083% nebulizer solution Take 2.5 mg by nebulization every 6 (six) hours as needed for shortness of breath.   Marland Kitchen albuterol (VENTOLIN HFA) 108 (90 Base) MCG/ACT inhaler Inhale 2 puffs into the lungs every 4 (four) hours as needed for wheezing or shortness of breath.  . ALPRAZolam (XANAX) 0.25 MG tablet Take 1 tablet (0.25 mg total) by mouth 2 (two) times daily as needed for anxiety. (Patient taking differently: Take 0.25 mg by mouth 4 (four) times daily. )  . amitriptyline (ELAVIL) 25 MG tablet Take 25 mg by mouth at bedtime.  . citalopram (CELEXA) 40 MG tablet Take 40 mg by mouth daily.   . cyanocobalamin (,VITAMIN B-12,) 1000 MCG/ML injection Inject 1,000 mcg into the muscle every 30 (thirty) days.   Marland Kitchen gabapentin (NEURONTIN) 300 MG capsule Take 300 mg by mouth 3 (three) times daily.   . metFORMIN (GLUCOPHAGE-XR) 500 MG 24 hr tablet Take 1,000 tablets by mouth Twice daily.   . mometasone-formoterol (DULERA) 200-5 MCG/ACT AERO Inhale 2 puffs into the lungs 2 (two) times daily.  Marland Kitchen nystatin (MYCOSTATIN/NYSTOP) powder Apply 1 g topically 2 (two) times daily.   Marland Kitchen omeprazole (PRILOSEC) 40 MG capsule Take one capsule every morning as directed.  . ranitidine (ZANTAC) 300 MG tablet Take 1 tablet (300 mg total) by mouth at bedtime.  . sucralfate (CARAFATE) 1 g tablet Take 1  tablet (1 g total) by mouth 4 (four) times daily.  Marland Kitchen topiramate (TOPAMAX) 100 MG tablet Take 100 mg by mouth at bedtime.  . triamcinolone cream (KENALOG) 0.1 %   . venlafaxine (EFFEXOR) 75 MG tablet   . [DISCONTINUED] Vitamin D, Ergocalciferol, (DRISDOL) 50000 units CAPS capsule Take 50,000 Units by mouth every 7 (seven) days. Takes on Saturdays.   No facility-administered encounter medications on file as of 11/07/2016.    Allergies  Allergen Reactions  . Sulfa Antibiotics  Swelling    Mouth and throat   . Codeine Itching  . Cymbalta [Duloxetine Hcl] Other (See Comments)    Cannot urinate  . Keflet [Cephalexin Monohydrate] Itching  . Keflex [Cephalexin]   . Macrodantin [Nitrofurantoin Macrocrystal] Nausea And Vomiting  . Mucinex [Guaifenesin Er] Other (See Comments)    Urinary retention  . Tape Rash   Patient Active Problem List   Diagnosis Date Noted  . Skin yeast infection 07/05/2015  . CAD (coronary artery disease)   . Chest pain 11/13/2013  . Intermittent lightheadedness 11/13/2013  . GERD (gastroesophageal reflux disease) 09/09/2012  . Obesity 06/25/2012  . Disorder of shoulder 06/04/2012  . Asthma 05/28/2012  . Diastolic dysfunction [id number]  . OSA (obstructive sleep apnea) 05/10/2012  . Dyspnea 05/07/2012  . Chest pain 08/24/2011  . Dyslipidemia 08/24/2011   Social History   Social History  . Marital status: Married    Spouse name: N/A  . Number of children: 2  . Years of education: N/A   Occupational History  . retired Retired   Social History Main Topics  . Smoking status: Never Smoker  . Smokeless tobacco: Never Used  . Alcohol use No  . Drug use: No  . Sexual activity: No   Other Topics Concern  . Not on file   Social History Narrative  . No narrative on file    Alexis Rios's family history includes Allergies in her daughter; Diabetes in her brother, other, and sister; Emphysema in her brother; Heart disease in her father and mother; Hypertension in her brother and sister; Leukemia in her brother; Liver cancer in her brother; Stroke in her father.      Objective:    Vitals:   11/07/16 0914  BP: 122/80  Pulse: 68    Physical Exam  well-developed older white female in no acute distress, pleasant blood pressure 122/80 pulse 68, height 4 foot 11 weight 220 BMI 44.4. Patient accompanied by her husband. HEENT ;nontraumatic normocephalic EOMI PERRLA sclera anicteric, Cardiovascular; regular rate and rhythm with  S1-S2 no murmur or gallop, Pulmonary ;clear bilaterally, Abdomen; morbidly obese, soft ,nontender nondistended bowel sounds are active there is no palpable mass or hepatosplenomegaly, Rectal; exam not done, Extremities; no clubbing cyanosis or edema skin warm and dry, Neuropsych ;mood and affect appropriate       Assessment & Plan:   #31 70 year old female with chronic GERD with nighttime symptoms refractory to omeprazole 40 mg by mouth daily and ranitidine 300 mg twice a day. Improved currently with addition of Carafate #2 mild pill dysphagia rule out early stricture #3 history of adenomatous colon polyps last colonoscopy 2013 will be due for follow-up 2018 #4 morbid obesity #5  sleep apnea #6 episode of chest pain 1 week ago-different than her usual reflux/heartburn related symptoms-rule out angina  Plan; Will schedule for barium swallow, proceed with EGD with dilation if stricture found Strict anti-reflux regimen reviewed with the patient, she does have the head of her bed elevated, recommended nothing  by mouth for 3 hours prior to bedtime She will continue omeprazole 40 mg by mouth every morning, and for now ranitidine 300 mg by mouth twice a day We will continue Carafate but change to slurry 1 g 4 times daily between meals and bedtime she may continue for another couple of months Will arrange for cardiac evaluation with Dr. Harrington Challenger, patient has seen in the past. She has not had any cardiac follow-up in several years and with recent episode of chest pain may benefit from stress testing.   Marvella Jenning S Zurisadai Helminiak PA-C 11/07/2016   Cc: Celene Squibb, MD   Addendum: Reviewed and agree with initial management. Jerene Bears, MD

## 2016-11-09 ENCOUNTER — Ambulatory Visit (HOSPITAL_COMMUNITY)
Admission: RE | Admit: 2016-11-09 | Discharge: 2016-11-09 | Disposition: A | Discharge status: Home / Self Care | Payer: Commercial Managed Care - HMO | Source: Ambulatory Visit | Attending: Physician Assistant | Admitting: Physician Assistant

## 2016-11-09 DIAGNOSIS — K219 Gastro-esophageal reflux disease without esophagitis: Secondary | ICD-10-CM | POA: Diagnosis not present

## 2016-11-09 DIAGNOSIS — K222 Esophageal obstruction: Secondary | ICD-10-CM | POA: Insufficient documentation

## 2016-11-09 DIAGNOSIS — K224 Dyskinesia of esophagus: Secondary | ICD-10-CM | POA: Insufficient documentation

## 2016-11-09 DIAGNOSIS — R079 Chest pain, unspecified: Secondary | ICD-10-CM | POA: Insufficient documentation

## 2016-12-06 ENCOUNTER — Ambulatory Visit (HOSPITAL_COMMUNITY)
Admission: RE | Admit: 2016-12-06 | Discharge: 2016-12-06 | Disposition: A | Discharge status: Home / Self Care | Payer: Commercial Managed Care - HMO | Source: Ambulatory Visit | Attending: Internal Medicine | Admitting: Internal Medicine

## 2016-12-06 ENCOUNTER — Other Ambulatory Visit (HOSPITAL_COMMUNITY): Payer: Self-pay | Admitting: Internal Medicine

## 2016-12-06 DIAGNOSIS — T148XXA Other injury of unspecified body region, initial encounter: Secondary | ICD-10-CM

## 2016-12-06 DIAGNOSIS — Z8781 Personal history of (healed) traumatic fracture: Secondary | ICD-10-CM | POA: Diagnosis not present

## 2016-12-06 DIAGNOSIS — W19XXXD Unspecified fall, subsequent encounter: Secondary | ICD-10-CM

## 2016-12-06 DIAGNOSIS — S92352D Displaced fracture of fifth metatarsal bone, left foot, subsequent encounter for fracture with routine healing: Secondary | ICD-10-CM | POA: Insufficient documentation

## 2016-12-06 DIAGNOSIS — M85872 Other specified disorders of bone density and structure, left ankle and foot: Secondary | ICD-10-CM | POA: Diagnosis not present

## 2016-12-11 HISTORY — PX: EYE SURGERY: SHX253

## 2016-12-18 DIAGNOSIS — S92355A Nondisplaced fracture of fifth metatarsal bone, left foot, initial encounter for closed fracture: Secondary | ICD-10-CM | POA: Diagnosis not present

## 2016-12-21 ENCOUNTER — Encounter: Payer: Commercial Managed Care - HMO | Admitting: Internal Medicine

## 2016-12-21 DIAGNOSIS — J209 Acute bronchitis, unspecified: Secondary | ICD-10-CM | POA: Diagnosis not present

## 2017-01-01 DIAGNOSIS — E782 Mixed hyperlipidemia: Secondary | ICD-10-CM | POA: Diagnosis not present

## 2017-01-01 DIAGNOSIS — E1121 Type 2 diabetes mellitus with diabetic nephropathy: Secondary | ICD-10-CM | POA: Diagnosis not present

## 2017-01-01 DIAGNOSIS — I10 Essential (primary) hypertension: Secondary | ICD-10-CM | POA: Diagnosis not present

## 2017-01-03 DIAGNOSIS — E1121 Type 2 diabetes mellitus with diabetic nephropathy: Secondary | ICD-10-CM | POA: Diagnosis not present

## 2017-01-03 DIAGNOSIS — C44311 Basal cell carcinoma of skin of nose: Secondary | ICD-10-CM | POA: Diagnosis not present

## 2017-01-03 DIAGNOSIS — G4733 Obstructive sleep apnea (adult) (pediatric): Secondary | ICD-10-CM | POA: Diagnosis not present

## 2017-01-03 DIAGNOSIS — E782 Mixed hyperlipidemia: Secondary | ICD-10-CM | POA: Diagnosis not present

## 2017-01-03 DIAGNOSIS — J449 Chronic obstructive pulmonary disease, unspecified: Secondary | ICD-10-CM | POA: Diagnosis not present

## 2017-01-11 ENCOUNTER — Ambulatory Visit (INDEPENDENT_AMBULATORY_CARE_PROVIDER_SITE_OTHER): Payer: Medicare Other | Admitting: Otolaryngology

## 2017-01-11 DIAGNOSIS — R42 Dizziness and giddiness: Secondary | ICD-10-CM | POA: Diagnosis not present

## 2017-01-11 DIAGNOSIS — H8111 Benign paroxysmal vertigo, right ear: Secondary | ICD-10-CM | POA: Diagnosis not present

## 2017-01-11 DIAGNOSIS — S92355D Nondisplaced fracture of fifth metatarsal bone, left foot, subsequent encounter for fracture with routine healing: Secondary | ICD-10-CM | POA: Diagnosis not present

## 2017-01-11 DIAGNOSIS — M1711 Unilateral primary osteoarthritis, right knee: Secondary | ICD-10-CM | POA: Diagnosis not present

## 2017-01-11 DIAGNOSIS — M1712 Unilateral primary osteoarthritis, left knee: Secondary | ICD-10-CM | POA: Diagnosis not present

## 2017-01-11 DIAGNOSIS — H903 Sensorineural hearing loss, bilateral: Secondary | ICD-10-CM | POA: Diagnosis not present

## 2017-01-24 ENCOUNTER — Ambulatory Visit: Payer: Commercial Managed Care - HMO | Admitting: Allergy and Immunology

## 2017-02-08 ENCOUNTER — Ambulatory Visit (INDEPENDENT_AMBULATORY_CARE_PROVIDER_SITE_OTHER): Payer: Medicare Other | Admitting: Otolaryngology

## 2017-02-08 DIAGNOSIS — M1711 Unilateral primary osteoarthritis, right knee: Secondary | ICD-10-CM | POA: Diagnosis not present

## 2017-02-08 DIAGNOSIS — H8111 Benign paroxysmal vertigo, right ear: Secondary | ICD-10-CM

## 2017-02-08 DIAGNOSIS — J310 Chronic rhinitis: Secondary | ICD-10-CM | POA: Diagnosis not present

## 2017-02-08 DIAGNOSIS — S92355D Nondisplaced fracture of fifth metatarsal bone, left foot, subsequent encounter for fracture with routine healing: Secondary | ICD-10-CM | POA: Diagnosis not present

## 2017-02-14 DIAGNOSIS — J340 Abscess, furuncle and carbuncle of nose: Secondary | ICD-10-CM | POA: Diagnosis not present

## 2017-02-19 DIAGNOSIS — L719 Rosacea, unspecified: Secondary | ICD-10-CM | POA: Diagnosis not present

## 2017-02-19 DIAGNOSIS — Z85828 Personal history of other malignant neoplasm of skin: Secondary | ICD-10-CM | POA: Diagnosis not present

## 2017-02-19 DIAGNOSIS — D485 Neoplasm of uncertain behavior of skin: Secondary | ICD-10-CM | POA: Diagnosis not present

## 2017-03-20 DIAGNOSIS — E1142 Type 2 diabetes mellitus with diabetic polyneuropathy: Secondary | ICD-10-CM | POA: Diagnosis not present

## 2017-03-20 DIAGNOSIS — B351 Tinea unguium: Secondary | ICD-10-CM | POA: Diagnosis not present

## 2017-03-20 DIAGNOSIS — L84 Corns and callosities: Secondary | ICD-10-CM | POA: Diagnosis not present

## 2017-03-20 DIAGNOSIS — M79676 Pain in unspecified toe(s): Secondary | ICD-10-CM | POA: Diagnosis not present

## 2017-04-05 DIAGNOSIS — M1712 Unilateral primary osteoarthritis, left knee: Secondary | ICD-10-CM | POA: Diagnosis not present

## 2017-04-05 DIAGNOSIS — M48061 Spinal stenosis, lumbar region without neurogenic claudication: Secondary | ICD-10-CM | POA: Diagnosis not present

## 2017-04-05 DIAGNOSIS — M1711 Unilateral primary osteoarthritis, right knee: Secondary | ICD-10-CM | POA: Diagnosis not present

## 2017-04-05 DIAGNOSIS — S92355D Nondisplaced fracture of fifth metatarsal bone, left foot, subsequent encounter for fracture with routine healing: Secondary | ICD-10-CM | POA: Diagnosis not present

## 2017-04-06 DIAGNOSIS — E1121 Type 2 diabetes mellitus with diabetic nephropathy: Secondary | ICD-10-CM | POA: Diagnosis not present

## 2017-04-06 DIAGNOSIS — I10 Essential (primary) hypertension: Secondary | ICD-10-CM | POA: Diagnosis not present

## 2017-04-06 DIAGNOSIS — D519 Vitamin B12 deficiency anemia, unspecified: Secondary | ICD-10-CM | POA: Diagnosis not present

## 2017-04-06 DIAGNOSIS — E559 Vitamin D deficiency, unspecified: Secondary | ICD-10-CM | POA: Diagnosis not present

## 2017-04-11 DIAGNOSIS — E1121 Type 2 diabetes mellitus with diabetic nephropathy: Secondary | ICD-10-CM | POA: Diagnosis not present

## 2017-04-11 DIAGNOSIS — C44311 Basal cell carcinoma of skin of nose: Secondary | ICD-10-CM | POA: Diagnosis not present

## 2017-04-11 DIAGNOSIS — J449 Chronic obstructive pulmonary disease, unspecified: Secondary | ICD-10-CM | POA: Diagnosis not present

## 2017-04-11 DIAGNOSIS — D519 Vitamin B12 deficiency anemia, unspecified: Secondary | ICD-10-CM | POA: Diagnosis not present

## 2017-04-11 DIAGNOSIS — K219 Gastro-esophageal reflux disease without esophagitis: Secondary | ICD-10-CM | POA: Diagnosis not present

## 2017-04-12 DIAGNOSIS — L299 Pruritus, unspecified: Secondary | ICD-10-CM | POA: Diagnosis not present

## 2017-04-12 DIAGNOSIS — Z85828 Personal history of other malignant neoplasm of skin: Secondary | ICD-10-CM | POA: Diagnosis not present

## 2017-04-12 DIAGNOSIS — D519 Vitamin B12 deficiency anemia, unspecified: Secondary | ICD-10-CM | POA: Diagnosis not present

## 2017-04-16 DIAGNOSIS — M48061 Spinal stenosis, lumbar region without neurogenic claudication: Secondary | ICD-10-CM | POA: Diagnosis not present

## 2017-04-25 DIAGNOSIS — M48061 Spinal stenosis, lumbar region without neurogenic claudication: Secondary | ICD-10-CM | POA: Diagnosis not present

## 2017-04-30 DIAGNOSIS — R040 Epistaxis: Secondary | ICD-10-CM | POA: Diagnosis not present

## 2017-04-30 DIAGNOSIS — N281 Cyst of kidney, acquired: Secondary | ICD-10-CM | POA: Diagnosis not present

## 2017-04-30 DIAGNOSIS — R600 Localized edema: Secondary | ICD-10-CM | POA: Diagnosis not present

## 2017-04-30 DIAGNOSIS — M48061 Spinal stenosis, lumbar region without neurogenic claudication: Secondary | ICD-10-CM | POA: Diagnosis not present

## 2017-05-01 ENCOUNTER — Other Ambulatory Visit: Payer: Self-pay | Admitting: Orthopedic Surgery

## 2017-05-01 DIAGNOSIS — M48061 Spinal stenosis, lumbar region without neurogenic claudication: Secondary | ICD-10-CM

## 2017-05-02 ENCOUNTER — Other Ambulatory Visit: Payer: Medicare Other

## 2017-05-04 ENCOUNTER — Ambulatory Visit
Admission: RE | Admit: 2017-05-04 | Discharge: 2017-05-04 | Disposition: A | Discharge status: Home / Self Care | Payer: Medicare Other | Source: Ambulatory Visit | Attending: Orthopedic Surgery | Admitting: Orthopedic Surgery

## 2017-05-04 DIAGNOSIS — M545 Low back pain: Secondary | ICD-10-CM | POA: Diagnosis not present

## 2017-05-04 DIAGNOSIS — M48061 Spinal stenosis, lumbar region without neurogenic claudication: Secondary | ICD-10-CM

## 2017-05-16 DIAGNOSIS — D519 Vitamin B12 deficiency anemia, unspecified: Secondary | ICD-10-CM | POA: Diagnosis not present

## 2017-05-29 ENCOUNTER — Telehealth: Payer: Self-pay | Admitting: Allergy and Immunology

## 2017-05-29 DIAGNOSIS — M48061 Spinal stenosis, lumbar region without neurogenic claudication: Secondary | ICD-10-CM | POA: Diagnosis not present

## 2017-05-29 NOTE — Telephone Encounter (Signed)
Pt called to see if she could get some sample of dulera and pro air. [phone].

## 2017-05-30 MED ORDER — MOMETASONE FURO-FORMOTEROL FUM 200-5 MCG/ACT IN AERO: 2.0000 | INHALATION_SPRAY | Freq: Two times a day (BID) | RESPIRATORY_TRACT | 0 refills | Status: DC

## 2017-05-30 MED ORDER — ALBUTEROL SULFATE HFA 108 (90 BASE) MCG/ACT IN AERS: 2.0000 | INHALATION_SPRAY | RESPIRATORY_TRACT | 0 refills | Status: DC | PRN

## 2017-05-30 NOTE — Telephone Encounter (Signed)
Left message to call office. Patient needs a office visit.

## 2017-05-30 NOTE — Telephone Encounter (Signed)
Sent in 1 courtesy refill with no refills left message advising of this.

## 2017-05-30 NOTE — Telephone Encounter (Signed)
Spoke to patient Informed her that she needed an office visit and once the appt is made then she will be given a courtesy refill until she can be seen Patient states that her husband has a lot of appts and she will call back to schedule

## 2017-05-30 NOTE — Telephone Encounter (Signed)
Message advised we do not have samples of this medication

## 2017-06-15 DIAGNOSIS — D519 Vitamin B12 deficiency anemia, unspecified: Secondary | ICD-10-CM | POA: Diagnosis not present

## 2017-06-21 DIAGNOSIS — S93432D Sprain of tibiofibular ligament of left ankle, subsequent encounter: Secondary | ICD-10-CM | POA: Diagnosis not present

## 2017-06-29 DIAGNOSIS — S93432D Sprain of tibiofibular ligament of left ankle, subsequent encounter: Secondary | ICD-10-CM | POA: Diagnosis not present

## 2017-07-02 DIAGNOSIS — M542 Cervicalgia: Secondary | ICD-10-CM | POA: Diagnosis not present

## 2017-07-02 DIAGNOSIS — M79604 Pain in right leg: Secondary | ICD-10-CM | POA: Diagnosis not present

## 2017-07-02 DIAGNOSIS — M25561 Pain in right knee: Secondary | ICD-10-CM | POA: Diagnosis not present

## 2017-07-02 DIAGNOSIS — Z79899 Other long term (current) drug therapy: Secondary | ICD-10-CM | POA: Diagnosis not present

## 2017-07-02 DIAGNOSIS — M545 Low back pain: Secondary | ICD-10-CM | POA: Diagnosis not present

## 2017-07-02 DIAGNOSIS — M5416 Radiculopathy, lumbar region: Secondary | ICD-10-CM | POA: Diagnosis not present

## 2017-07-02 DIAGNOSIS — M25569 Pain in unspecified knee: Secondary | ICD-10-CM | POA: Diagnosis not present

## 2017-07-06 DIAGNOSIS — S93432D Sprain of tibiofibular ligament of left ankle, subsequent encounter: Secondary | ICD-10-CM | POA: Diagnosis not present

## 2017-07-20 DIAGNOSIS — D509 Iron deficiency anemia, unspecified: Secondary | ICD-10-CM | POA: Diagnosis not present

## 2017-07-20 DIAGNOSIS — E1121 Type 2 diabetes mellitus with diabetic nephropathy: Secondary | ICD-10-CM | POA: Diagnosis not present

## 2017-07-20 DIAGNOSIS — E559 Vitamin D deficiency, unspecified: Secondary | ICD-10-CM | POA: Diagnosis not present

## 2017-07-20 DIAGNOSIS — I10 Essential (primary) hypertension: Secondary | ICD-10-CM | POA: Diagnosis not present

## 2017-07-20 DIAGNOSIS — D519 Vitamin B12 deficiency anemia, unspecified: Secondary | ICD-10-CM | POA: Diagnosis not present

## 2017-07-25 DIAGNOSIS — G4733 Obstructive sleep apnea (adult) (pediatric): Secondary | ICD-10-CM | POA: Diagnosis not present

## 2017-07-25 DIAGNOSIS — D509 Iron deficiency anemia, unspecified: Secondary | ICD-10-CM | POA: Diagnosis not present

## 2017-07-25 DIAGNOSIS — K219 Gastro-esophageal reflux disease without esophagitis: Secondary | ICD-10-CM | POA: Diagnosis not present

## 2017-07-25 DIAGNOSIS — E1121 Type 2 diabetes mellitus with diabetic nephropathy: Secondary | ICD-10-CM | POA: Diagnosis not present

## 2017-07-25 DIAGNOSIS — J449 Chronic obstructive pulmonary disease, unspecified: Secondary | ICD-10-CM | POA: Diagnosis not present

## 2017-08-01 DIAGNOSIS — M25569 Pain in unspecified knee: Secondary | ICD-10-CM | POA: Diagnosis not present

## 2017-08-01 DIAGNOSIS — M542 Cervicalgia: Secondary | ICD-10-CM | POA: Diagnosis not present

## 2017-08-01 DIAGNOSIS — M5416 Radiculopathy, lumbar region: Secondary | ICD-10-CM | POA: Diagnosis not present

## 2017-08-01 DIAGNOSIS — M545 Low back pain: Secondary | ICD-10-CM | POA: Diagnosis not present

## 2017-08-02 DIAGNOSIS — M545 Low back pain: Secondary | ICD-10-CM | POA: Diagnosis not present

## 2017-08-02 DIAGNOSIS — Z79899 Other long term (current) drug therapy: Secondary | ICD-10-CM | POA: Diagnosis not present

## 2017-08-02 DIAGNOSIS — M542 Cervicalgia: Secondary | ICD-10-CM | POA: Diagnosis not present

## 2017-08-02 DIAGNOSIS — M130 Polyarthritis, unspecified: Secondary | ICD-10-CM | POA: Diagnosis not present

## 2017-08-08 DIAGNOSIS — D519 Vitamin B12 deficiency anemia, unspecified: Secondary | ICD-10-CM | POA: Diagnosis not present

## 2017-08-22 DIAGNOSIS — D519 Vitamin B12 deficiency anemia, unspecified: Secondary | ICD-10-CM | POA: Diagnosis not present

## 2017-08-28 DIAGNOSIS — M25519 Pain in unspecified shoulder: Secondary | ICD-10-CM | POA: Diagnosis not present

## 2017-08-28 DIAGNOSIS — M25569 Pain in unspecified knee: Secondary | ICD-10-CM | POA: Diagnosis not present

## 2017-08-28 DIAGNOSIS — M25539 Pain in unspecified wrist: Secondary | ICD-10-CM | POA: Diagnosis not present

## 2017-09-26 DIAGNOSIS — D519 Vitamin B12 deficiency anemia, unspecified: Secondary | ICD-10-CM | POA: Diagnosis not present

## 2017-09-27 DIAGNOSIS — M5416 Radiculopathy, lumbar region: Secondary | ICD-10-CM | POA: Diagnosis not present

## 2017-09-27 DIAGNOSIS — M25569 Pain in unspecified knee: Secondary | ICD-10-CM | POA: Diagnosis not present

## 2017-09-27 DIAGNOSIS — M542 Cervicalgia: Secondary | ICD-10-CM | POA: Diagnosis not present

## 2017-09-27 DIAGNOSIS — M545 Low back pain: Secondary | ICD-10-CM | POA: Diagnosis not present

## 2017-10-05 DIAGNOSIS — R110 Nausea: Secondary | ICD-10-CM | POA: Diagnosis not present

## 2017-10-05 DIAGNOSIS — I2510 Atherosclerotic heart disease of native coronary artery without angina pectoris: Secondary | ICD-10-CM | POA: Diagnosis not present

## 2017-10-05 DIAGNOSIS — J449 Chronic obstructive pulmonary disease, unspecified: Secondary | ICD-10-CM | POA: Diagnosis not present

## 2017-10-05 DIAGNOSIS — R079 Chest pain, unspecified: Secondary | ICD-10-CM | POA: Diagnosis not present

## 2017-10-05 DIAGNOSIS — I119 Hypertensive heart disease without heart failure: Secondary | ICD-10-CM | POA: Diagnosis not present

## 2017-10-05 DIAGNOSIS — M12811 Other specific arthropathies, not elsewhere classified, right shoulder: Secondary | ICD-10-CM | POA: Diagnosis not present

## 2017-10-05 DIAGNOSIS — K219 Gastro-esophageal reflux disease without esophagitis: Secondary | ICD-10-CM | POA: Diagnosis not present

## 2017-10-05 DIAGNOSIS — Z888 Allergy status to other drugs, medicaments and biological substances status: Secondary | ICD-10-CM | POA: Diagnosis not present

## 2017-10-05 DIAGNOSIS — Z79899 Other long term (current) drug therapy: Secondary | ICD-10-CM | POA: Diagnosis not present

## 2017-10-05 DIAGNOSIS — E785 Hyperlipidemia, unspecified: Secondary | ICD-10-CM | POA: Diagnosis not present

## 2017-10-05 DIAGNOSIS — R918 Other nonspecific abnormal finding of lung field: Secondary | ICD-10-CM | POA: Diagnosis not present

## 2017-10-05 DIAGNOSIS — R61 Generalized hyperhidrosis: Secondary | ICD-10-CM | POA: Diagnosis not present

## 2017-10-05 DIAGNOSIS — R9431 Abnormal electrocardiogram [ECG] [EKG]: Secondary | ICD-10-CM | POA: Diagnosis not present

## 2017-10-05 DIAGNOSIS — R0789 Other chest pain: Secondary | ICD-10-CM | POA: Diagnosis not present

## 2017-10-05 DIAGNOSIS — M12812 Other specific arthropathies, not elsewhere classified, left shoulder: Secondary | ICD-10-CM | POA: Diagnosis not present

## 2017-10-05 DIAGNOSIS — Z885 Allergy status to narcotic agent status: Secondary | ICD-10-CM | POA: Diagnosis not present

## 2017-10-05 DIAGNOSIS — G8929 Other chronic pain: Secondary | ICD-10-CM | POA: Diagnosis not present

## 2017-10-05 DIAGNOSIS — E119 Type 2 diabetes mellitus without complications: Secondary | ICD-10-CM | POA: Diagnosis not present

## 2017-10-05 DIAGNOSIS — Z882 Allergy status to sulfonamides status: Secondary | ICD-10-CM | POA: Diagnosis not present

## 2017-10-06 DIAGNOSIS — R079 Chest pain, unspecified: Secondary | ICD-10-CM | POA: Diagnosis not present

## 2017-10-06 DIAGNOSIS — R0789 Other chest pain: Secondary | ICD-10-CM | POA: Diagnosis not present

## 2017-10-23 DIAGNOSIS — E1121 Type 2 diabetes mellitus with diabetic nephropathy: Secondary | ICD-10-CM | POA: Diagnosis not present

## 2017-10-23 DIAGNOSIS — E559 Vitamin D deficiency, unspecified: Secondary | ICD-10-CM | POA: Diagnosis not present

## 2017-10-23 DIAGNOSIS — D519 Vitamin B12 deficiency anemia, unspecified: Secondary | ICD-10-CM | POA: Diagnosis not present

## 2017-10-23 DIAGNOSIS — D509 Iron deficiency anemia, unspecified: Secondary | ICD-10-CM | POA: Diagnosis not present

## 2017-10-25 DIAGNOSIS — J449 Chronic obstructive pulmonary disease, unspecified: Secondary | ICD-10-CM | POA: Diagnosis not present

## 2017-10-25 DIAGNOSIS — D519 Vitamin B12 deficiency anemia, unspecified: Secondary | ICD-10-CM | POA: Diagnosis not present

## 2017-10-25 DIAGNOSIS — I2510 Atherosclerotic heart disease of native coronary artery without angina pectoris: Secondary | ICD-10-CM | POA: Diagnosis not present

## 2017-10-25 DIAGNOSIS — Z23 Encounter for immunization: Secondary | ICD-10-CM | POA: Diagnosis not present

## 2017-10-25 DIAGNOSIS — E1121 Type 2 diabetes mellitus with diabetic nephropathy: Secondary | ICD-10-CM | POA: Diagnosis not present

## 2017-10-25 DIAGNOSIS — E559 Vitamin D deficiency, unspecified: Secondary | ICD-10-CM | POA: Diagnosis not present

## 2017-10-25 DIAGNOSIS — Z85828 Personal history of other malignant neoplasm of skin: Secondary | ICD-10-CM | POA: Diagnosis not present

## 2017-10-31 DIAGNOSIS — Z85828 Personal history of other malignant neoplasm of skin: Secondary | ICD-10-CM | POA: Diagnosis not present

## 2017-10-31 DIAGNOSIS — Z1283 Encounter for screening for malignant neoplasm of skin: Secondary | ICD-10-CM | POA: Diagnosis not present

## 2017-10-31 DIAGNOSIS — L821 Other seborrheic keratosis: Secondary | ICD-10-CM | POA: Diagnosis not present

## 2017-10-31 DIAGNOSIS — Z79899 Other long term (current) drug therapy: Secondary | ICD-10-CM | POA: Diagnosis not present

## 2017-10-31 DIAGNOSIS — D485 Neoplasm of uncertain behavior of skin: Secondary | ICD-10-CM | POA: Diagnosis not present

## 2017-10-31 DIAGNOSIS — D1801 Hemangioma of skin and subcutaneous tissue: Secondary | ICD-10-CM | POA: Diagnosis not present

## 2017-10-31 DIAGNOSIS — L304 Erythema intertrigo: Secondary | ICD-10-CM | POA: Diagnosis not present

## 2017-10-31 DIAGNOSIS — C44311 Basal cell carcinoma of skin of nose: Secondary | ICD-10-CM | POA: Diagnosis not present

## 2017-10-31 DIAGNOSIS — D229 Melanocytic nevi, unspecified: Secondary | ICD-10-CM | POA: Diagnosis not present

## 2017-11-07 DIAGNOSIS — M4722 Other spondylosis with radiculopathy, cervical region: Secondary | ICD-10-CM | POA: Diagnosis not present

## 2017-11-07 DIAGNOSIS — M542 Cervicalgia: Secondary | ICD-10-CM | POA: Diagnosis not present

## 2017-11-07 DIAGNOSIS — M5030 Other cervical disc degeneration, unspecified cervical region: Secondary | ICD-10-CM | POA: Diagnosis not present

## 2017-11-12 ENCOUNTER — Encounter: Payer: Self-pay | Admitting: Internal Medicine

## 2017-11-13 DIAGNOSIS — Z85828 Personal history of other malignant neoplasm of skin: Secondary | ICD-10-CM | POA: Diagnosis not present

## 2017-11-13 DIAGNOSIS — C44311 Basal cell carcinoma of skin of nose: Secondary | ICD-10-CM | POA: Diagnosis not present

## 2017-11-21 DIAGNOSIS — Z483 Aftercare following surgery for neoplasm: Secondary | ICD-10-CM | POA: Diagnosis not present

## 2017-11-21 DIAGNOSIS — C44311 Basal cell carcinoma of skin of nose: Secondary | ICD-10-CM | POA: Diagnosis not present

## 2017-11-26 DIAGNOSIS — M5010 Cervical disc disorder with radiculopathy, unspecified cervical region: Secondary | ICD-10-CM | POA: Diagnosis not present

## 2017-11-26 DIAGNOSIS — Z79891 Long term (current) use of opiate analgesic: Secondary | ICD-10-CM | POA: Diagnosis not present

## 2017-11-26 DIAGNOSIS — M25569 Pain in unspecified knee: Secondary | ICD-10-CM | POA: Diagnosis not present

## 2017-11-26 DIAGNOSIS — M130 Polyarthritis, unspecified: Secondary | ICD-10-CM | POA: Diagnosis not present

## 2017-11-28 DIAGNOSIS — Z4802 Encounter for removal of sutures: Secondary | ICD-10-CM | POA: Diagnosis not present

## 2017-12-02 DIAGNOSIS — Y999 Unspecified external cause status: Secondary | ICD-10-CM | POA: Diagnosis not present

## 2017-12-02 DIAGNOSIS — J302 Other seasonal allergic rhinitis: Secondary | ICD-10-CM | POA: Diagnosis not present

## 2017-12-02 DIAGNOSIS — I5030 Unspecified diastolic (congestive) heart failure: Secondary | ICD-10-CM | POA: Insufficient documentation

## 2017-12-02 DIAGNOSIS — E119 Type 2 diabetes mellitus without complications: Secondary | ICD-10-CM | POA: Insufficient documentation

## 2017-12-02 DIAGNOSIS — Z7984 Long term (current) use of oral hypoglycemic drugs: Secondary | ICD-10-CM | POA: Diagnosis not present

## 2017-12-02 DIAGNOSIS — J449 Chronic obstructive pulmonary disease, unspecified: Secondary | ICD-10-CM | POA: Insufficient documentation

## 2017-12-02 DIAGNOSIS — I2510 Atherosclerotic heart disease of native coronary artery without angina pectoris: Secondary | ICD-10-CM | POA: Insufficient documentation

## 2017-12-02 DIAGNOSIS — S52571A Other intraarticular fracture of lower end of right radius, initial encounter for closed fracture: Secondary | ICD-10-CM | POA: Diagnosis not present

## 2017-12-02 DIAGNOSIS — Y92009 Unspecified place in unspecified non-institutional (private) residence as the place of occurrence of the external cause: Secondary | ICD-10-CM | POA: Diagnosis not present

## 2017-12-02 DIAGNOSIS — Y9301 Activity, walking, marching and hiking: Secondary | ICD-10-CM | POA: Insufficient documentation

## 2017-12-02 DIAGNOSIS — W0110XA Fall on same level from slipping, tripping and stumbling with subsequent striking against unspecified object, initial encounter: Secondary | ICD-10-CM | POA: Insufficient documentation

## 2017-12-02 DIAGNOSIS — M25531 Pain in right wrist: Secondary | ICD-10-CM | POA: Diagnosis not present

## 2017-12-02 DIAGNOSIS — I110 Hypertensive heart disease with heart failure: Secondary | ICD-10-CM | POA: Insufficient documentation

## 2017-12-02 DIAGNOSIS — Z79899 Other long term (current) drug therapy: Secondary | ICD-10-CM | POA: Insufficient documentation

## 2017-12-03 ENCOUNTER — Encounter (HOSPITAL_COMMUNITY): Payer: Self-pay | Admitting: *Deleted

## 2017-12-03 ENCOUNTER — Other Ambulatory Visit: Payer: Self-pay

## 2017-12-03 ENCOUNTER — Emergency Department (HOSPITAL_COMMUNITY)
Admission: EM | Admit: 2017-12-03 | Discharge: 2017-12-03 | Disposition: A | Discharge status: Home / Self Care | Payer: Medicare Other | Attending: Emergency Medicine | Admitting: Emergency Medicine

## 2017-12-03 ENCOUNTER — Emergency Department (HOSPITAL_COMMUNITY): Payer: Medicare Other

## 2017-12-03 DIAGNOSIS — S52571A Other intraarticular fracture of lower end of right radius, initial encounter for closed fracture: Secondary | ICD-10-CM

## 2017-12-03 DIAGNOSIS — M25531 Pain in right wrist: Secondary | ICD-10-CM | POA: Diagnosis not present

## 2017-12-03 MED ORDER — MORPHINE SULFATE (PF) 4 MG/ML IV SOLN
4.0000 mg | Freq: Once | INTRAVENOUS | Status: AC
  Administered 2017-12-03: 4 mg via INTRAMUSCULAR
  Filled 2017-12-03: qty 1

## 2017-12-03 MED ORDER — HYDROCODONE-ACETAMINOPHEN 5-325 MG PO TABS: 1.0000 | ORAL_TABLET | Freq: Four times a day (QID) | ORAL | 0 refills | Status: DC | PRN

## 2017-12-03 NOTE — Discharge Instructions (Signed)
Elevate your arm, use ice packs to reduce the swelling and for comfort.  Take the Norco for pain, you also have tramadol to take at home.  Keep the splint clean and dry.  Please wear the splint until you can be reevaluated.  Please call Dr. Stann Mainland office to get a follow-up appointment within the next week.  Return to the ED for any problems listed on the head injury sheet.

## 2017-12-03 NOTE — ED Provider Notes (Signed)
[facility] EMERGENCY DEPARTMENT Provider Note   CSN: [id number] Arrival date & time: 12/02/17  2321  Time seen 1:35 AM   History   Chief Complaint Chief Complaint  Patient presents with  . Fall    HPI Alexis Rios is a 71 y.o. female.  HPI patient states she was walking through her house and she tripped over her feet and fell.  She states she falls a lot.  She states she hit the back of her head but denies loss of consciousness or headache.  She has chronic neck pain that is not changed.  She states she has pain in her right wrist.  She denies any numbness of her fingers.  She states it started swelling and bruising after she fell.  Patient uses a walker when she ambulates outside the house but not in the house.  She states she is right-handed.  PCP Celene Squibb, MD Orthopedics Dr Stann Mainland for fracture of ankle and foot in the past  Past Medical History:  Diagnosis Date  . Anemia   . Anxiety   . Arthritis   . Asthma   . CAD (coronary artery disease)    Cath 2009, AV groove CX 30%, RCA 20 %  . Candidiasis of skin and nail   . Carpal tunnel syndrome   . Chronic back pain   . Chronic headaches    migraines  . Complication of anesthesia    if no breathing tx before anesthesia wakes with asthma  . COPD (chronic obstructive pulmonary disease) ([place])   . Depression   . Diabetes mellitus   . Diverticulosis   . Dyspnea    Cardiopulmonary stress test 2013: Normal functional capacity when compared to matched sedentary norms; obesity and deconditioning primary factors contributing to overall limitation; ?OHS.  . Gastroparesis    ?  Marland Kitchen GERD (gastroesophageal reflux disease)   . H/O echocardiogram    Echo 09/2011: Mild LVH, EF 22-97%, grade 1 diastolic dysfunction, trivial AI, mild MR, mild LAE.  . H/O exercise stress test    Dobutamine Myoview 09/2011: No scar or ischemia, no EKG changes, study not gated.  . HH (hiatus hernia)   . History of gallstones   . History of  pneumonia   . Hypercholesteremia   . Hypertension   . IBS (irritable bowel syndrome)   . Osteoporosis   . Peripheral neuropathy   . Pneumonia   . S/P cardiac catheterization    LHC 03/2008: Mid AV groove circumflex 30%, mid RCA 20%, EF 60%  . Seasonal allergies   . Skin cancer    scc/bcc  . Skin yeast infection 07/05/2015  . Sleep apnea    cpap wwith O2 2yrs  . Stones in the urinary tract   . Subclinical hypothyroidism   . UTI (urinary tract infection)     Patient Active Problem List   Diagnosis Date Noted  . Skin yeast infection 07/05/2015  . CAD (coronary artery disease)   . Chest pain 11/13/2013  . Intermittent lightheadedness 11/13/2013  . GERD (gastroesophageal reflux disease) 09/09/2012  . Obesity 06/25/2012  . Disorder of shoulder 06/04/2012  . Asthma 05/28/2012  . Diastolic dysfunction 98/92/1194  . OSA (obstructive sleep apnea) 05/10/2012  . Dyspnea 05/07/2012  . Chest pain 08/24/2011  . Dyslipidemia 08/24/2011    Past Surgical History:  Procedure Laterality Date  . ABDOMINAL HYSTERECTOMY     partial  . APPENDECTOMY    . BACK SURGERY     x3  .  BACK SURGERY    . BREAST SURGERY     lump x 2 R  . CHOLECYSTECTOMY    . FOOT SURGERY     R  . KNEE SURGERY     R  . NOSE SURGERY     skin cancer excision  . shoulder surg     x2 R  . TOE AMPUTATION     right  . TONSILLECTOMY    . UMBILICAL HERNIA REPAIR    . WRIST SURGERY     L    OB History    No data available       Home Medications    Prior to Admission medications   Medication Sig Start Date End Date Taking? Authorizing Provider  albuterol (VENTOLIN HFA) 108 (90 Base) MCG/ACT inhaler Inhale 2 puffs into the lungs every 4 (four) hours as needed for wheezing or shortness of breath. 05/30/17  Yes Kozlow, Donnamarie Poag, MD  ALPRAZolam Duanne Moron) 0.25 MG tablet Take 1 tablet (0.25 mg total) by mouth 2 (two) times daily as needed for anxiety. Patient taking differently: Take 0.25 mg by mouth 4 (four) times  daily.  11/14/13  Yes Black, Lezlie Octave, NP  citalopram (CELEXA) 40 MG tablet Take 40 mg by mouth daily.    Yes [provider]  cyanocobalamin (,VITAMIN B-12,) 1000 MCG/ML injection Inject 1,000 mcg into the muscle every 30 (thirty) days.  07/11/16  Yes [provider]  gabapentin (NEURONTIN) 300 MG capsule Take 300 mg by mouth 3 (three) times daily.    Yes [provider]  mometasone-formoterol (DULERA) 200-5 MCG/ACT AERO Inhale 2 puffs into the lungs 2 (two) times daily. 05/30/17  Yes Kozlow, Donnamarie Poag, MD  omeprazole (PRILOSEC) 40 MG capsule Take one capsule every morning as directed. 11/01/16  Yes Kozlow, Donnamarie Poag, MD  ranitidine (ZANTAC) 300 MG tablet Take 1 tablet (300 mg total) by mouth at bedtime. 11/01/16  Yes Kozlow, Donnamarie Poag, MD  sucralfate (CARAFATE) 1 g tablet Take 1 tablet (1 g total) by mouth 4 (four) times daily. 11/01/16  Yes Kozlow, Donnamarie Poag, MD  topiramate (TOPAMAX) 100 MG tablet Take 100 mg by mouth at bedtime.   Yes [provider]  albuterol (PROVENTIL) (2.5 MG/3ML) 0.083% nebulizer solution Take 2.5 mg by nebulization every 6 (six) hours as needed for shortness of breath.     [provider]  amitriptyline (ELAVIL) 25 MG tablet Take 25 mg by mouth at bedtime.    [provider]  HYDROcodone-acetaminophen (NORCO/VICODIN) 5-325 MG tablet Take 1 tablet by mouth every 6 (six) hours as needed. 12/03/17   Rolland Porter, MD  metFORMIN (GLUCOPHAGE-XR) 500 MG 24 hr tablet Take 1,000 tablets by mouth Twice daily.  08/24/11   [provider]  nystatin (MYCOSTATIN/NYSTOP) powder Apply 1 g topically 2 (two) times daily.  05/30/16   [provider]  triamcinolone cream (KENALOG) 0.1 %  09/08/16   [provider]  venlafaxine (EFFEXOR) 75 MG tablet  10/30/16   [provider]    Family History Family History  Problem Relation Age of Onset  . Allergies Daughter   . Liver cancer Brother   . Hypertension Brother   .  Diabetes Brother   . Leukemia Brother   . Emphysema Brother   . Heart disease Mother   . Heart disease Father   . Stroke Father   . Hypertension Sister   . Diabetes Sister   . Diabetes Other  all brothers and sister x 5    Social History Social History   Tobacco Use  . Smoking status: Never Smoker  . Smokeless tobacco: Never Used  Substance Use Topics  . Alcohol use: No  . Drug use: No  uses a walker Lives with spouse retired   Allergies   Sulfa antibiotics; Codeine; Cymbalta [duloxetine hcl]; Keflet [cephalexin monohydrate]; Keflex [cephalexin]; Macrodantin [nitrofurantoin macrocrystal]; Mucinex [guaifenesin er]; and Tape   Review of Systems Review of Systems  All other systems reviewed and are negative.    Physical Exam Updated Vital Signs BP (!) 157/85 (BP Location: Left Arm)   Pulse (!) 138   Temp 98.3 F (36.8 C) (Oral)   Resp 20   Ht 4\' 11"  (1.499 m)   Wt 89.4 kg (197 lb)   SpO2 95%   BMI 39.79 kg/m   Vital signs normal except hypertension   Physical Exam  Constitutional: She is oriented to person, place, and time. She appears well-developed and well-nourished. She appears distressed.  HENT:  Head: Normocephalic and atraumatic.  Right Ear: External ear normal.  Left Ear: External ear normal.  Nose: Nose normal.  Eyes: Conjunctivae and EOM are normal. Pupils are equal, round, and reactive to light.  Neck: Normal range of motion.  Cardiovascular: Normal rate.  Pulmonary/Chest: Effort normal. No respiratory distress.  Musculoskeletal: She exhibits edema, tenderness and deformity.  Patient is noted to have diffuse swelling and bruising around her right wrist.  She has good distal pulses.  Her sensation in her fingers is normal.  She has good grip.  She is nontender to palpation in her elbow or her shoulder.  Neurological: She is alert and oriented to person, place, and time. No cranial nerve deficit.  Skin: Skin is warm and dry.  Psychiatric:  She has a normal mood and affect. Her behavior is normal. Thought content normal.  Nursing note and vitals reviewed.    ED Treatments / Results  Labs (all labs ordered are listed, but only abnormal results are displayed) Labs Reviewed - No data to display  EKG  EKG Interpretation None       Radiology Dg Wrist Complete Right  Result Date: 12/03/2017 CLINICAL DATA:  71 year old female with fall and right wrist pain. EXAM: RIGHT WRIST - COMPLETE 3+ VIEW COMPARISON:  Right wrist radiograph dated 11/25/2014 FINDINGS: There is a fracture of the distal radius with volar angulation of the distal fracture fragment. There is extension of the fracture into the medial articular surface. No dislocation. The bones are osteopenic. There is soft tissue swelling of the wrist. IMPRESSION: Inter articular, comminuted appearing fracture of the distal radius with volar angulation of the distal fracture fragment. No dislocation. Electronically Signed   By: Anner Crete M.D.   On: 12/03/2017 01:14    Procedures Procedures (including critical care time)  Medications Ordered in ED Medications  morphine 4 MG/ML injection 4 mg (4 mg Intramuscular Given 12/03/17 0204)     Initial Impression / Assessment and Plan / ED Course  I have reviewed the triage vital signs and the nursing notes.  Pertinent labs & imaging results that were available during my care of the patient were reviewed by me and considered in my medical decision making (see chart for details).    Patient was given morphine IM for pain.  She was placed in a sugar tong splint and sling.  She already has a orthopedist, Dr. Stann Mainland to follow-up with.  She is advised to call the  office this morning, they may not be open due to the holiday.  However she should call as soon as they are open and get a follow-up appointment with him for definitive care.  We discussed that sometimes this type of fracture does require surgery for definitive care.  CT  scan of the head was not done, patient denies any headache.  She really does not want it addressed.  She is not on a blood thinner.  Review the [place] shows patient gets 240 tramadol 50 mg tablets on August 22, October 18, and December 17.  In June she got 60 tablets, and in July she got 180 tablets.  These were written by Barton Fanny in [place] (pain management with Dr Merlene Laughter).   Final Clinical Impressions(s) / ED Diagnoses   Final diagnoses:  Other closed intra-articular fracture of distal end of right radius, initial encounter    ED Discharge Orders        Ordered    HYDROcodone-acetaminophen (NORCO/VICODIN) 5-325 MG tablet  Every 6 hours PRN     12/03/17 0231      Plan discharge  Rolland Porter, MD, Barbette Or, MD 12/03/17 [phone]

## 2017-12-03 NOTE — ED Triage Notes (Signed)
Pt c/o pain to right wrist after fall this evening; pt states she tripped over her feet; pt has bruising and swelling to right wrist; radial pulse palpated

## 2017-12-10 DIAGNOSIS — M25531 Pain in right wrist: Secondary | ICD-10-CM | POA: Diagnosis not present

## 2017-12-10 DIAGNOSIS — S52571A Other intraarticular fracture of lower end of right radius, initial encounter for closed fracture: Secondary | ICD-10-CM | POA: Diagnosis not present

## 2017-12-11 HISTORY — PX: EYE SURGERY: SHX253

## 2017-12-13 DIAGNOSIS — G8918 Other acute postprocedural pain: Secondary | ICD-10-CM | POA: Diagnosis not present

## 2017-12-13 DIAGNOSIS — S52571A Other intraarticular fracture of lower end of right radius, initial encounter for closed fracture: Secondary | ICD-10-CM | POA: Diagnosis not present

## 2017-12-13 HISTORY — PX: OTHER SURGICAL HISTORY: SHX169

## 2017-12-27 DIAGNOSIS — Z4789 Encounter for other orthopedic aftercare: Secondary | ICD-10-CM | POA: Diagnosis not present

## 2017-12-27 DIAGNOSIS — S52514B Nondisplaced fracture of right radial styloid process, initial encounter for open fracture type I or II: Secondary | ICD-10-CM | POA: Diagnosis not present

## 2018-01-01 DIAGNOSIS — D519 Vitamin B12 deficiency anemia, unspecified: Secondary | ICD-10-CM | POA: Diagnosis not present

## 2018-01-03 DIAGNOSIS — Z483 Aftercare following surgery for neoplasm: Secondary | ICD-10-CM | POA: Diagnosis not present

## 2018-01-03 DIAGNOSIS — C44311 Basal cell carcinoma of skin of nose: Secondary | ICD-10-CM | POA: Diagnosis not present

## 2018-01-10 DIAGNOSIS — M25531 Pain in right wrist: Secondary | ICD-10-CM | POA: Diagnosis not present

## 2018-01-10 DIAGNOSIS — S5291XD Unspecified fracture of right forearm, subsequent encounter for closed fracture with routine healing: Secondary | ICD-10-CM | POA: Diagnosis not present

## 2018-01-16 DIAGNOSIS — M25631 Stiffness of right wrist, not elsewhere classified: Secondary | ICD-10-CM | POA: Diagnosis not present

## 2018-01-28 DIAGNOSIS — K219 Gastro-esophageal reflux disease without esophagitis: Secondary | ICD-10-CM | POA: Diagnosis not present

## 2018-01-28 DIAGNOSIS — G629 Polyneuropathy, unspecified: Secondary | ICD-10-CM | POA: Diagnosis not present

## 2018-01-28 DIAGNOSIS — E1121 Type 2 diabetes mellitus with diabetic nephropathy: Secondary | ICD-10-CM | POA: Diagnosis not present

## 2018-01-28 DIAGNOSIS — D519 Vitamin B12 deficiency anemia, unspecified: Secondary | ICD-10-CM | POA: Diagnosis not present

## 2018-01-28 DIAGNOSIS — C44311 Basal cell carcinoma of skin of nose: Secondary | ICD-10-CM | POA: Diagnosis not present

## 2018-01-30 DIAGNOSIS — D519 Vitamin B12 deficiency anemia, unspecified: Secondary | ICD-10-CM | POA: Diagnosis not present

## 2018-01-30 DIAGNOSIS — E1121 Type 2 diabetes mellitus with diabetic nephropathy: Secondary | ICD-10-CM | POA: Diagnosis not present

## 2018-01-30 DIAGNOSIS — E782 Mixed hyperlipidemia: Secondary | ICD-10-CM | POA: Diagnosis not present

## 2018-01-31 DIAGNOSIS — S5291XD Unspecified fracture of right forearm, subsequent encounter for closed fracture with routine healing: Secondary | ICD-10-CM | POA: Diagnosis not present

## 2018-02-04 DIAGNOSIS — Z961 Presence of intraocular lens: Secondary | ICD-10-CM | POA: Diagnosis not present

## 2018-02-04 DIAGNOSIS — E119 Type 2 diabetes mellitus without complications: Secondary | ICD-10-CM | POA: Diagnosis not present

## 2018-02-04 DIAGNOSIS — Z9849 Cataract extraction status, unspecified eye: Secondary | ICD-10-CM | POA: Diagnosis not present

## 2018-02-06 DIAGNOSIS — S5291XD Unspecified fracture of right forearm, subsequent encounter for closed fracture with routine healing: Secondary | ICD-10-CM | POA: Diagnosis not present

## 2018-02-08 DIAGNOSIS — M1712 Unilateral primary osteoarthritis, left knee: Secondary | ICD-10-CM | POA: Diagnosis not present

## 2018-02-08 DIAGNOSIS — M170 Bilateral primary osteoarthritis of knee: Secondary | ICD-10-CM | POA: Diagnosis not present

## 2018-02-08 DIAGNOSIS — M1711 Unilateral primary osteoarthritis, right knee: Secondary | ICD-10-CM | POA: Diagnosis not present

## 2018-02-08 DIAGNOSIS — M25561 Pain in right knee: Secondary | ICD-10-CM | POA: Diagnosis not present

## 2018-02-08 DIAGNOSIS — M25562 Pain in left knee: Secondary | ICD-10-CM | POA: Diagnosis not present

## 2018-02-22 ENCOUNTER — Other Ambulatory Visit (HOSPITAL_COMMUNITY): Payer: Self-pay | Admitting: Internal Medicine

## 2018-02-22 DIAGNOSIS — Z1231 Encounter for screening mammogram for malignant neoplasm of breast: Secondary | ICD-10-CM

## 2018-02-25 ENCOUNTER — Ambulatory Visit (HOSPITAL_COMMUNITY)
Admission: RE | Admit: 2018-02-25 | Discharge: 2018-02-25 | Disposition: A | Discharge status: Home / Self Care | Payer: Medicare Other | Source: Ambulatory Visit | Attending: Internal Medicine | Admitting: Internal Medicine

## 2018-02-25 DIAGNOSIS — Z1231 Encounter for screening mammogram for malignant neoplasm of breast: Secondary | ICD-10-CM

## 2018-02-28 DIAGNOSIS — D519 Vitamin B12 deficiency anemia, unspecified: Secondary | ICD-10-CM | POA: Diagnosis not present

## 2018-03-26 DIAGNOSIS — M7742 Metatarsalgia, left foot: Secondary | ICD-10-CM | POA: Diagnosis not present

## 2018-03-26 DIAGNOSIS — M79672 Pain in left foot: Secondary | ICD-10-CM | POA: Diagnosis not present

## 2018-04-09 DIAGNOSIS — M1712 Unilateral primary osteoarthritis, left knee: Secondary | ICD-10-CM | POA: Diagnosis not present

## 2018-04-09 DIAGNOSIS — M1711 Unilateral primary osteoarthritis, right knee: Secondary | ICD-10-CM | POA: Diagnosis not present

## 2018-04-11 DIAGNOSIS — D519 Vitamin B12 deficiency anemia, unspecified: Secondary | ICD-10-CM | POA: Diagnosis not present

## 2018-04-11 DIAGNOSIS — G629 Polyneuropathy, unspecified: Secondary | ICD-10-CM | POA: Diagnosis not present

## 2018-04-16 DIAGNOSIS — Z13820 Encounter for screening for osteoporosis: Secondary | ICD-10-CM | POA: Diagnosis not present

## 2018-04-16 DIAGNOSIS — M899 Disorder of bone, unspecified: Secondary | ICD-10-CM | POA: Diagnosis not present

## 2018-04-22 DIAGNOSIS — E1121 Type 2 diabetes mellitus with diabetic nephropathy: Secondary | ICD-10-CM | POA: Diagnosis not present

## 2018-04-22 DIAGNOSIS — E782 Mixed hyperlipidemia: Secondary | ICD-10-CM | POA: Diagnosis not present

## 2018-04-24 DIAGNOSIS — J060 Acute laryngopharyngitis: Secondary | ICD-10-CM | POA: Diagnosis not present

## 2018-04-24 DIAGNOSIS — J449 Chronic obstructive pulmonary disease, unspecified: Secondary | ICD-10-CM | POA: Diagnosis not present

## 2018-04-24 DIAGNOSIS — E119 Type 2 diabetes mellitus without complications: Secondary | ICD-10-CM | POA: Diagnosis not present

## 2018-04-24 DIAGNOSIS — I10 Essential (primary) hypertension: Secondary | ICD-10-CM | POA: Diagnosis not present

## 2018-04-24 DIAGNOSIS — G4733 Obstructive sleep apnea (adult) (pediatric): Secondary | ICD-10-CM | POA: Diagnosis not present

## 2018-04-29 ENCOUNTER — Other Ambulatory Visit (HOSPITAL_COMMUNITY): Payer: Self-pay | Admitting: Internal Medicine

## 2018-04-29 DIAGNOSIS — Z780 Asymptomatic menopausal state: Secondary | ICD-10-CM

## 2018-05-09 ENCOUNTER — Other Ambulatory Visit (HOSPITAL_COMMUNITY): Payer: Medicare Other

## 2018-05-10 DIAGNOSIS — M1711 Unilateral primary osteoarthritis, right knee: Secondary | ICD-10-CM | POA: Diagnosis not present

## 2018-05-13 DIAGNOSIS — Z961 Presence of intraocular lens: Secondary | ICD-10-CM | POA: Diagnosis not present

## 2018-05-13 DIAGNOSIS — H26493 Other secondary cataract, bilateral: Secondary | ICD-10-CM | POA: Diagnosis not present

## 2018-05-15 ENCOUNTER — Ambulatory Visit (HOSPITAL_COMMUNITY)
Admission: RE | Admit: 2018-05-15 | Discharge: 2018-05-15 | Disposition: A | Discharge status: Home / Self Care | Payer: Medicare Other | Source: Ambulatory Visit | Attending: Internal Medicine | Admitting: Internal Medicine

## 2018-05-15 ENCOUNTER — Encounter (HOSPITAL_COMMUNITY): Payer: Self-pay

## 2018-05-15 DIAGNOSIS — Z780 Asymptomatic menopausal state: Secondary | ICD-10-CM

## 2018-06-03 ENCOUNTER — Encounter: Payer: Medicare Other | Attending: Orthopedic Surgery | Admitting: Nutrition

## 2018-06-03 VITALS — Ht 59.0 in | Wt 202.0 lb

## 2018-06-03 DIAGNOSIS — M1711 Unilateral primary osteoarthritis, right knee: Secondary | ICD-10-CM | POA: Diagnosis not present

## 2018-06-03 DIAGNOSIS — E669 Obesity, unspecified: Secondary | ICD-10-CM

## 2018-06-03 DIAGNOSIS — Z713 Dietary counseling and surveillance: Secondary | ICD-10-CM | POA: Insufficient documentation

## 2018-06-03 DIAGNOSIS — E119 Type 2 diabetes mellitus without complications: Secondary | ICD-10-CM

## 2018-06-03 NOTE — Progress Notes (Signed)
  Medical Nutrition Therapy:  Appt start time: 1100 end time:  1200.   Assessment:  Primary concerns today: Obesity, Diabetes Type 2-- Diet controlled. Has been asked to lose weight for a right knee replacement. Had lost 30 lbs but gained 5 lbs back. Her DM wants her to lose another 30 lbs. 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 Has had one toe ampuated due to overlapping big and second toe.  PCP Dr. Nevada Crane. Ortho MD Dr. Victorino December. Lives with her husband. Had cataract surgery. Most recent A1C she thinks was 5.8% now.. No longer testing blood sugars due to A1C less than 6%. No longer on medications for her diabetes. Has lost almost 100 lbs in 4 years ago. Has been cutting back on processed foods, junk food and drinking more water and unsweet tea.  Eats 1 meal per day. Had been going to [facility] and got bronchitis and stopped a few months ago. Willing to restart water aerobics..  Limit mobility due to knee and feet issues.   Motivated and engaged in making changes with diet and exercise to lose weight and keep her DM under good control. Current diet is too restrictive to help with healthy weight loss and contributing to her lack of energy and motivation to exercise.   Preferred Learning Style:    No preference indicated   Learning Readiness:   Ready  Change in progress   MEDICATIONS: see list   DIETARY INTAKE:   24-hr recall:  B ( AM): Protein bar  Snk ( AM):  Water or unsweet tea or lemonade  5 pm CHicken grilled, tacos,  2 soft taco shells, Tea, lightly sugared. Beverages: Water and lightly sweet tea   Usual physical activity:ADL  Estimated energy needs: 1200 calories 135 g carbohydrates 90 g protein 33 g fat  Progress Towards Goal(s):  In progress.   Nutritional Diagnosis:  NB-1.1 Food and nutrition-related knowledge deficit As related to Diabetes Type 2 and Obesity.  As evidenced by A1C 5.8% and BMI of 40..    Intervention:  Nutrition and Diabetes education provided on My  Plate, CHO counting, meal planning, portion sizes, timing of meals, avoiding snacks between meals unless having a low blood sugar, target ranges for A1C and blood sugars, signs/symptoms and treatment of hyper/hypoglycemia, monitoring blood sugars, taking medications as prescribed, benefits of exercising 30 minutes per day and prevention of complications of DM.  Goals 1. Follow My Plate 2. Increase fresh fruits and vegetables 3. Drink only water and no sodas, juices or sugar tea 4. Dont' skip meals 5. Cut out snacks. Exercise 15-30 mins a day. Lose 1 lbs per week   Teaching Method Utilized:  Visual Auditory Hands on  Handouts given during visit include:  The Plate Method   Meal Plan Card   Barriers to learning/adherence to lifestyle change: Hip and feet issues.   Demonstrated degree of understanding via:  Teach Back   Monitoring/Evaluation:  Dietary intake, exercise, meal planning, and body weight in 1-2 month(s).

## 2018-06-03 NOTE — Patient Instructions (Signed)
Goals 1. Follow My Plate 2. Increase fresh fruits and vegetables 3. Drink only water and no sodas, juices or sugar tea 4. Dont' skip meals 5. Cut out snacks. Exercise 15-30 mins a day. Lose 1 lbs per week

## 2018-06-05 ENCOUNTER — Encounter: Payer: Self-pay | Admitting: Nutrition

## 2018-06-17 DIAGNOSIS — D519 Vitamin B12 deficiency anemia, unspecified: Secondary | ICD-10-CM | POA: Diagnosis not present

## 2018-06-27 DIAGNOSIS — B351 Tinea unguium: Secondary | ICD-10-CM | POA: Diagnosis not present

## 2018-06-27 DIAGNOSIS — M79676 Pain in unspecified toe(s): Secondary | ICD-10-CM | POA: Diagnosis not present

## 2018-06-27 DIAGNOSIS — L84 Corns and callosities: Secondary | ICD-10-CM | POA: Diagnosis not present

## 2018-07-03 ENCOUNTER — Telehealth: Payer: Self-pay

## 2018-07-03 DIAGNOSIS — M1711 Unilateral primary osteoarthritis, right knee: Secondary | ICD-10-CM | POA: Diagnosis not present

## 2018-07-03 NOTE — Telephone Encounter (Signed)
   [facility] Pre-operative Risk Assessment    Request for surgical clearance:  1. What type of surgery is being performed? Right Knee Replacement   2. When is this surgery scheduled? TBD   3. What type of clearance is required (medical clearance vs. Pharmacy clearance to hold med vs. Both)? Cardiac Clearance  4. Are there any medications that need to be held prior to surgery and how long? None listed   5. Practice name and name of physician performing surgery? [facility]- Dr. Lyla Glassing   6. What is your office phone number? [phone]    7.   What is your office fax number? [phone] Attn: Santiago Bur  8.   Anesthesia type (None, local, MAC, general)? Not listed

## 2018-07-03 NOTE — Telephone Encounter (Signed)
Patient walked in clinic today with Clearance form. Patient has not been seen by Dr. Harrington Challenger since 10/06/2013. Patient was scheduled as a new patient by check-out with Melina Copa, PA on 08/14/18 at 9:30 AM for clearance. Will route clearance to Melina Copa, PA as FYI and place form in her box.

## 2018-07-22 DIAGNOSIS — E1121 Type 2 diabetes mellitus with diabetic nephropathy: Secondary | ICD-10-CM | POA: Diagnosis not present

## 2018-07-22 DIAGNOSIS — I10 Essential (primary) hypertension: Secondary | ICD-10-CM | POA: Diagnosis not present

## 2018-07-22 DIAGNOSIS — I2510 Atherosclerotic heart disease of native coronary artery without angina pectoris: Secondary | ICD-10-CM | POA: Diagnosis not present

## 2018-07-22 DIAGNOSIS — E782 Mixed hyperlipidemia: Secondary | ICD-10-CM | POA: Diagnosis not present

## 2018-07-22 DIAGNOSIS — D519 Vitamin B12 deficiency anemia, unspecified: Secondary | ICD-10-CM | POA: Diagnosis not present

## 2018-07-25 DIAGNOSIS — M79675 Pain in left toe(s): Secondary | ICD-10-CM | POA: Diagnosis not present

## 2018-07-25 DIAGNOSIS — M2042 Other hammer toe(s) (acquired), left foot: Secondary | ICD-10-CM | POA: Diagnosis not present

## 2018-07-30 ENCOUNTER — Encounter: Payer: Medicare Other | Attending: Internal Medicine | Admitting: Nutrition

## 2018-07-30 ENCOUNTER — Encounter: Payer: Self-pay | Admitting: Nutrition

## 2018-07-30 VITALS — Ht 61.0 in | Wt 204.0 lb

## 2018-07-30 DIAGNOSIS — E669 Obesity, unspecified: Secondary | ICD-10-CM

## 2018-07-30 NOTE — Patient Instructions (Addendum)
Goals 1. Be more consistent with meals 2. Increase vegetables and fresh fruits 3. Grill meats 4. Look into getting a table top exercise bike. Try chair exercises daily. Lose 2 lbs per month Don't eat past 7 pm

## 2018-07-30 NOTE — Progress Notes (Signed)
  Medical Nutrition Therapy:  Appt start time: 1330 end time:  1400  Assessment:  Primary concerns today: Obesity, Diabetes Type 2-- Diet controlled.    Changes made; Hasn't been able to exercise due to back pain. Changes made: increased more vegetables, increased fruit. Drinking some 2% milk. Cut out sodas. Has cut  back on sugar in tea but still drinking 1/2 and 1/2 tea. Says she can't stand taste of water.  Going to see Dr. Melanie Crazier, in [place], Ortho MD. She said she thought she had lost weight but over did it when eating out of town. Limited mobility due to back and knee issues. Hoping to get her knee replacement in the next month or so. Willing to consider a table top bicycle for arm and feet exercises for increased physical activity. Admits to eating late at night and snacking as her biggest issues.  Preferred Learning Style:    No preference indicated   Learning Readiness:   Ready  Change in progress   MEDICATIONS: see list   DIETARY INTAKE:   24-hr recall:  B ( AM): Protein bar  Snk ( AM):  Water or unsweet tea or lemonade  5 pm CHicken grilled, tacos,  2 soft taco shells, Tea, lightly sugared. Beverages: Water and lightly sweet tea   Usual physical activity:ADL  Estimated energy needs: 1200 calories 135 g carbohydrates 90 g protein 33 g fat  Progress Towards Goal(s):  In progress.   Nutritional Diagnosis:  NB-1.1 Food and nutrition-related knowledge deficit As related to Diabetes Type 2 and Obesity.  As evidenced by A1C 5.8% and BMI of 40..    Intervention:  Nutrition and Diabetes education provided on My Plate, CHO counting, meal planning, portion sizes, timing of meals, avoiding snacks between meals unless having a low blood sugar, target ranges for A1C and blood sugars, signs/symptoms and treatment of hyper/hypoglycemia, monitoring blood sugars, taking medications as prescribed, benefits of exercising 30 minutes per day and prevention of complications of  DM.  Goals 1. Be more consistent with meals 2. Increase vegetables and fresh fruits 3. Grill meats 4. Look into getting a table top exercise bike. Try chair exercises daily. Lose 2 lbs per month Don't eat past 7 pm   Teaching Method Utilized:  Visual Auditory Hands on  Handouts given during visit include:  The Plate Method   Meal Plan Card   Barriers to learning/adherence to lifestyle change: Hip and feet issues.   Demonstrated degree of understanding via:  Teach Back   Monitoring/Evaluation:  Dietary intake, exercise, meal planning, and body weight in 1-2 month(s).

## 2018-08-01 DIAGNOSIS — K219 Gastro-esophageal reflux disease without esophagitis: Secondary | ICD-10-CM | POA: Diagnosis not present

## 2018-08-01 DIAGNOSIS — M1990 Unspecified osteoarthritis, unspecified site: Secondary | ICD-10-CM | POA: Diagnosis not present

## 2018-08-01 DIAGNOSIS — Z91048 Other nonmedicinal substance allergy status: Secondary | ICD-10-CM | POA: Diagnosis not present

## 2018-08-01 DIAGNOSIS — M810 Age-related osteoporosis without current pathological fracture: Secondary | ICD-10-CM | POA: Diagnosis not present

## 2018-08-01 DIAGNOSIS — R7303 Prediabetes: Secondary | ICD-10-CM | POA: Diagnosis not present

## 2018-08-01 DIAGNOSIS — B351 Tinea unguium: Secondary | ICD-10-CM | POA: Diagnosis not present

## 2018-08-01 DIAGNOSIS — G8929 Other chronic pain: Secondary | ICD-10-CM | POA: Diagnosis not present

## 2018-08-01 DIAGNOSIS — Z888 Allergy status to other drugs, medicaments and biological substances status: Secondary | ICD-10-CM | POA: Diagnosis not present

## 2018-08-01 DIAGNOSIS — M2042 Other hammer toe(s) (acquired), left foot: Secondary | ICD-10-CM | POA: Diagnosis not present

## 2018-08-01 DIAGNOSIS — G43909 Migraine, unspecified, not intractable, without status migrainosus: Secondary | ICD-10-CM | POA: Diagnosis not present

## 2018-08-01 DIAGNOSIS — M216X2 Other acquired deformities of left foot: Secondary | ICD-10-CM | POA: Diagnosis not present

## 2018-08-01 DIAGNOSIS — Z881 Allergy status to other antibiotic agents status: Secondary | ICD-10-CM | POA: Diagnosis not present

## 2018-08-01 DIAGNOSIS — M79672 Pain in left foot: Secondary | ICD-10-CM | POA: Diagnosis not present

## 2018-08-01 DIAGNOSIS — Z882 Allergy status to sulfonamides status: Secondary | ICD-10-CM | POA: Diagnosis not present

## 2018-08-01 DIAGNOSIS — D3610 Benign neoplasm of peripheral nerves and autonomic nervous system, unspecified: Secondary | ICD-10-CM | POA: Diagnosis not present

## 2018-08-01 DIAGNOSIS — M7740 Metatarsalgia, unspecified foot: Secondary | ICD-10-CM | POA: Diagnosis not present

## 2018-08-01 DIAGNOSIS — G629 Polyneuropathy, unspecified: Secondary | ICD-10-CM | POA: Diagnosis not present

## 2018-08-01 DIAGNOSIS — E7800 Pure hypercholesterolemia, unspecified: Secondary | ICD-10-CM | POA: Diagnosis not present

## 2018-08-01 DIAGNOSIS — Z79899 Other long term (current) drug therapy: Secondary | ICD-10-CM | POA: Diagnosis not present

## 2018-08-13 DIAGNOSIS — M79671 Pain in right foot: Secondary | ICD-10-CM | POA: Diagnosis not present

## 2018-08-13 DIAGNOSIS — M2041 Other hammer toe(s) (acquired), right foot: Secondary | ICD-10-CM | POA: Diagnosis not present

## 2018-08-14 ENCOUNTER — Ambulatory Visit: Payer: Medicare Other | Admitting: Physician Assistant

## 2018-08-22 DIAGNOSIS — E1121 Type 2 diabetes mellitus with diabetic nephropathy: Secondary | ICD-10-CM | POA: Diagnosis not present

## 2018-08-22 DIAGNOSIS — G629 Polyneuropathy, unspecified: Secondary | ICD-10-CM | POA: Diagnosis not present

## 2018-08-22 DIAGNOSIS — K219 Gastro-esophageal reflux disease without esophagitis: Secondary | ICD-10-CM | POA: Diagnosis not present

## 2018-08-22 DIAGNOSIS — D519 Vitamin B12 deficiency anemia, unspecified: Secondary | ICD-10-CM | POA: Diagnosis not present

## 2018-08-22 DIAGNOSIS — C44311 Basal cell carcinoma of skin of nose: Secondary | ICD-10-CM | POA: Diagnosis not present

## 2018-08-27 DIAGNOSIS — K219 Gastro-esophageal reflux disease without esophagitis: Secondary | ICD-10-CM | POA: Diagnosis not present

## 2018-08-27 DIAGNOSIS — D519 Vitamin B12 deficiency anemia, unspecified: Secondary | ICD-10-CM | POA: Diagnosis not present

## 2018-08-27 DIAGNOSIS — G629 Polyneuropathy, unspecified: Secondary | ICD-10-CM | POA: Diagnosis not present

## 2018-08-27 DIAGNOSIS — E1121 Type 2 diabetes mellitus with diabetic nephropathy: Secondary | ICD-10-CM | POA: Diagnosis not present

## 2018-08-27 DIAGNOSIS — Z23 Encounter for immunization: Secondary | ICD-10-CM | POA: Diagnosis not present

## 2018-09-12 ENCOUNTER — Encounter: Payer: Self-pay | Admitting: Cardiology

## 2018-09-12 ENCOUNTER — Ambulatory Visit: Payer: Medicare Other | Admitting: Cardiology

## 2018-09-12 ENCOUNTER — Encounter

## 2018-09-12 VITALS — BP 120/62 | HR 60 | Ht 61.0 in | Wt 199.1 lb

## 2018-09-12 DIAGNOSIS — Z01818 Encounter for other preprocedural examination: Secondary | ICD-10-CM | POA: Diagnosis not present

## 2018-09-12 DIAGNOSIS — I2510 Atherosclerotic heart disease of native coronary artery without angina pectoris: Secondary | ICD-10-CM

## 2018-09-12 NOTE — Patient Instructions (Signed)
Medication Instructions:    Your physician recommends that you continue on your current medications as directed. Please refer to the Current Medication list given to you today.   If you need a refill on your cardiac medications before your next appointment, please call your pharmacy.  Labwork: NONE ORDERED  TODAY    Testing/Procedures: NONE ORDERED  TODAY   Follow-Up: Your physician wants you to follow-up in: [place] will receive a reminder letter in the mail two months in advance. If you don't receive a letter, please call our office to schedule the follow-up appointment.     Any Other Special Instructions Will Be Listed Below (If Applicable).

## 2018-09-12 NOTE — Progress Notes (Signed)
09/12/2018 Alexis Rios   1946-06-28  [id number]  Primary Physician Celene Squibb, MD Primary Cardiologist: New (Dr. Marlou Porch, DOD, previously Dr. Harrington Challenger in 2014)  Reason for Visit/CC: Preoperative Evaluation   HPI:  Alexis Rios is a 72 y.o. female who is being seen today for preoperative evaluation. She is needing to undergo Rt TKR for OA.   She is being seen today as a new pt. She was seen previously by Dr. Harrington Challenger but lost to f/u and has not been seen since 2014.   She has a hx of DM2, HTN, HL, obesity, asthma, chronic low back pain from DDD s/p 4 back surgeries, severe knee OA and CAD.   LHC 03/2008: Mid AV groove circumflex 30%, mid RCA 20%, EF 60%. Echo 09/2011: Mild LVH, EF 40-98%, grade 1 diastolic dysfunction, trivial AI, mild MR, mild LAE. Dobutamine Myoview 09/2011: No scar or ischemia, no EKG changes, study not gated. Cardiopulmonary stress test 2013: Normal functional capacity when compared to matched sedentary norms; obesity and deconditioning primary factors contributing to overall limitation; ?OHS.  Since last being seen at [facility], she was admitted to [facility] in October 2018 for substernal chest pain radiating to her jaw. Per records in Care Everywhere, cardiac enzymes were negative x 3. She underwent a nuclear stress test that was negative for ischemia (see result note below). CP felt to be noncardiac.   Since that time, she denies any recurrent CP. No dyspnea, syncope/ near syncope. Due to her severe knee OA and chronic LBP, she admits that she is limited in regards to physical activity. Unable to ambulate up steps, however no exertional symptoms or resting CP with ADLs.   EKG today shows sinus bradycardia 55 bpm. No ST abnormalities.  BP is well controlled at 120/62.    Cardiac Studies LHC 2009 Left main was long, angiographically normal.   LAD was a moderate-sized vessel which tapered distally gave off 2  diagonals, was angiographically normal.   Left  circumflex gave off larger OM-1 and OM-2 and had 30% stenosis in  the mid AV groove circ, otherwise normal.   Right coronary artery was large dominant vessel gave off an RV branch,  PDA, and posterolateral.  There was 20% plaque in the mid RCA.   Left ventriculogram showed an ejection fraction of 60%.  There was no  regional wall motion abnormalities.  No trace mitral regurgitation.   ASSESSMENT:  1. Minimal nonobstructive coronary artery disease.  2. Normal left ventricular function.   2D Echo 2014 Study Conclusions  - Study data: Technically adequate study. - Left ventricle: The cavity size was normal. Wall thickness was increased in a pattern of mild LVH. Systolic function was normal. The estimated ejection fraction was in the range of 60% to 65%. Wall motion was normal; there were no regional wall motion abnormalities. Left ventricular diastolic function parameters were normal. - Aortic valve: Mildly calcified annulus. Trileaflet; mildly thickened leaflets. Valve area: 2.82cm^2(VTI). Valve area: 2.39cm^2 (Vmax).  NST [facility] 09/2017 FINDINGS: Artifacts: None. Lung Activity: None. Ventricular Dilatation: None.   Apex: Stress: normal; Rest: normal Apical anterior: Stress: normal; Rest: normal Apical septal: Stress: normal; Rest: normal Apical inferior: Stress: normal; Rest: normal Apical lateral: Stress: normal; Rest: normal Mid anterior: Stress: normal; Rest: normal Mid anteroseptal: Stress: normal; Rest: normal Mid inferoseptal: Stress: normal; Rest: normal Mid inferior: Stress: Mild defect; Rest: Fixed - resolves with attenuation correction Mid inferolateral: Stress: normal; Rest: normal Mid anterolateral: Stress: normal; Rest: normal  Basal anterior: Stress: normal; Rest: normal Basal anteroseptal: Stress: normal; Rest: normal Basal inferoseptal: Stress: normal; Rest: normal Basal inferior: Stress: Moderate defect; Rest: Fixed - resolves  with attenuation correction Basal inferolateral: Stress: normal; Rest: normal Basal anterolateral: Stress: normal; Rest: normal  Gated study reveals Normal LV motion and thickening of all segments. Calculated LVEF = 59%.  CT images reviewed and demonstrate mild scattered coronary artery atherosclerotic calcifications. There is subsegmental atelectasis in the visualized lungs. Please note CT images were used for attenuation correction and are not a diagnostic study.   IMPRESSION:  1. No reversible myocardial perfusion defects identified. 2. Apparent fixed inferior segment defects resolve with attenuation correction, most compatible with diaphragm attenuation artifact. 3. Normal calculated left ventricle ejection fraction. 4. Mild scattered coronary artery atherosclerotic calcifications noted on the CT portion of the exam.  Electronically Signed QP:RFFM Olive Bass, MD, [facility] Radiology Electronically Signed on:10/06/2017 12:25 PM  Current Meds  Medication Sig  . albuterol (PROVENTIL) (2.5 MG/3ML) 0.083% nebulizer solution Take 2.5 mg by nebulization every 6 (six) hours as needed for shortness of breath.   Marland Kitchen albuterol (VENTOLIN HFA) 108 (90 Base) MCG/ACT inhaler Inhale 2 puffs into the lungs every 4 (four) hours as needed for wheezing or shortness of breath.  . ALPRAZolam (XANAX) 0.25 MG tablet Take 0.25 mg by mouth 2 (two) times daily.  . budesonide-formoterol (SYMBICORT) 80-4.5 MCG/ACT inhaler Symbicort 80 mcg-4.5 mcg/actuation HFA aerosol inhaler  . citalopram (CELEXA) 40 MG tablet Take 40 mg by mouth daily.   . cyanocobalamin (,VITAMIN B-12,) 1000 MCG/ML injection Inject 1,000 mcg into the muscle every 30 (thirty) days.   Marland Kitchen gabapentin (NEURONTIN) 300 MG capsule Take 300 mg by mouth 3 (three) times daily.   Marland Kitchen HYDROcodone-acetaminophen (NORCO/VICODIN) 5-325 MG tablet Take 1 tablet by mouth every 6 (six) hours as needed.  . mometasone-formoterol (DULERA) 200-5 MCG/ACT AERO Inhale 2  puffs into the lungs 2 (two) times daily.  . mupirocin ointment (BACTROBAN) 2 % Apply 1 application topically daily.  Marland Kitchen omeprazole (PRILOSEC) 40 MG capsule Take one capsule every morning as directed.  . ranitidine (ZANTAC) 300 MG tablet Take 1 tablet (300 mg total) by mouth at bedtime.  . rosuvastatin (CRESTOR) 5 MG tablet Take 5 mg by mouth once a week.  . sucralfate (CARAFATE) 1 g tablet Take 1 tablet (1 g total) by mouth 4 (four) times daily.  Marland Kitchen topiramate (TOPAMAX) 100 MG tablet Take 100 mg by mouth at bedtime.  . triamcinolone cream (KENALOG) 0.1 %   . Vitamin D, Ergocalciferol, (DRISDOL) 50000 units CAPS capsule Take by mouth.  . [DISCONTINUED] ALPRAZolam (XANAX) 0.25 MG tablet Take 1 tablet (0.25 mg total) by mouth 2 (two) times daily as needed for anxiety. (Patient taking differently: Take 0.25 mg by mouth 4 (four) times daily. )  . [DISCONTINUED] amitriptyline (ELAVIL) 25 MG tablet Take 25 mg by mouth at bedtime.  . [DISCONTINUED] metFORMIN (GLUCOPHAGE-XR) 500 MG 24 hr tablet Take 1,000 tablets by mouth Twice daily.   . [DISCONTINUED] nystatin (MYCOSTATIN/NYSTOP) powder Apply 1 g topically 2 (two) times daily.   . [DISCONTINUED] venlafaxine (EFFEXOR) 75 MG tablet    Allergies  Allergen Reactions  . Sulfa Antibiotics Swelling    Mouth and throat   . Codeine Itching  . Cymbalta [Duloxetine Hcl] Other (See Comments)    Cannot urinate  . Keflet [Cephalexin Monohydrate] Itching  . Keflex [Cephalexin]   . Macrodantin [Nitrofurantoin Macrocrystal] Nausea And Vomiting  . Mucinex [Guaifenesin Er] Other (See Comments)  Urinary retention  . Tape Rash   Past Medical History:  Diagnosis Date  . Anemia   . Anxiety   . Arthritis   . Asthma   . CAD (coronary artery disease)    Cath 2009, AV groove CX 30%, RCA 20 %  . Candidiasis of skin and nail   . Carpal tunnel syndrome   . Chronic back pain   . Chronic headaches    migraines  . Complication of anesthesia    if no breathing  tx before anesthesia wakes with asthma  . COPD (chronic obstructive pulmonary disease) ([place])   . Depression   . Diabetes mellitus   . Diverticulosis   . Dyspnea    Cardiopulmonary stress test 2013: Normal functional capacity when compared to matched sedentary norms; obesity and deconditioning primary factors contributing to overall limitation; ?OHS.  . Gastroparesis    ?  Marland Kitchen GERD (gastroesophageal reflux disease)   . H/O echocardiogram    Echo 09/2011: Mild LVH, EF 74-25%, grade 1 diastolic dysfunction, trivial AI, mild MR, mild LAE.  . H/O exercise stress test    Dobutamine Myoview 09/2011: No scar or ischemia, no EKG changes, study not gated.  . HH (hiatus hernia)   . History of gallstones   . History of pneumonia   . Hypercholesteremia   . Hypertension   . IBS (irritable bowel syndrome)   . Osteoporosis   . Peripheral neuropathy   . Pneumonia   . S/P cardiac catheterization    LHC 03/2008: Mid AV groove circumflex 30%, mid RCA 20%, EF 60%  . Seasonal allergies   . Skin cancer    scc/bcc  . Skin yeast infection 07/05/2015  . Sleep apnea    cpap wwith O2 40yrs  . Stones in the urinary tract   . Subclinical hypothyroidism   . UTI (urinary tract infection)    Family History  Problem Relation Age of Onset  . Allergies Daughter   . Liver cancer Brother   . Hypertension Brother   . Diabetes Brother   . Leukemia Brother   . Emphysema Brother   . Heart disease Mother   . Heart disease Father   . Stroke Father   . Hypertension Sister   . Diabetes Sister   . Diabetes Other        all brothers and sister x 5   Past Surgical History:  Procedure Laterality Date  . ABDOMINAL HYSTERECTOMY     partial  . APPENDECTOMY    . BACK SURGERY     x3  . BACK SURGERY    . BREAST SURGERY     lump x 2 R  . CHOLECYSTECTOMY    . FOOT SURGERY     R  . KNEE SURGERY     R  . NOSE SURGERY     skin cancer excision  . shoulder surg     x2 R  . TOE AMPUTATION     right  .  TONSILLECTOMY    . UMBILICAL HERNIA REPAIR    . WRIST SURGERY     L   Social History   Socioeconomic History  . Marital status: Married    Spouse name: Not on file  . Number of children: 2  . Years of education: Not on file  . Highest education level: Not on file  Occupational History  . Occupation: retired    Fish farm manager: RETIRED  Social Needs  . Financial resource strain: Not on file  . Food insecurity:  Worry: Not on file    Inability: Not on file  . Transportation needs:    Medical: Not on file    Non-medical: Not on file  Tobacco Use  . Smoking status: Never Smoker  . Smokeless tobacco: Never Used  Substance and Sexual Activity  . Alcohol use: No  . Drug use: No  . Sexual activity: Never  Lifestyle  . Physical activity:    Days per week: Not on file    Minutes per session: Not on file  . Stress: Not on file  Relationships  . Social connections:    Talks on phone: Not on file    Gets together: Not on file    Attends religious service: Not on file    Active member of club or organization: Not on file    Attends meetings of clubs or organizations: Not on file    Relationship status: Not on file  . Intimate partner violence:    Fear of current or ex partner: Not on file    Emotionally abused: Not on file    Physically abused: Not on file    Forced sexual activity: Not on file  Other Topics Concern  . Not on file  Social History Narrative  . Not on file     Review of Systems: General: negative for chills, fever, night sweats or weight changes.  Cardiovascular: negative for chest pain, dyspnea on exertion, edema, orthopnea, palpitations, paroxysmal nocturnal dyspnea or shortness of breath Dermatological: negative for rash Respiratory: negative for cough or wheezing Urologic: negative for hematuria Abdominal: negative for nausea, vomiting, diarrhea, bright red blood per rectum, melena, or hematemesis Neurologic: negative for visual changes, syncope, or  dizziness All other systems reviewed and are otherwise negative except as noted above.   Physical Exam:  Blood pressure 120/62, pulse 60, height 5\' 1"  (1.549 m), weight 199 lb 1.9 oz (90.3 kg), SpO2 98 %.  General appearance: alert, cooperative, no distress and moderately obese Neck: no carotid bruit and no JVD Lungs: clear to auscultation bilaterally Heart: regular rate and rhythm, S1, S2 normal, no murmur, click, rub or gallop Extremities: extremities normal, atraumatic, no cyanosis or edema Pulses: 2+ and symmetric Skin: Skin color, texture, turgor normal. No rashes or lesions Neurologic: Grossly normal  EKG sinus bradycardia 55 -- personally reviewed   ASSESSMENT AND PLAN:   1. CAD: LHC 03/2008: Mid AV groove circumflex 30%, mid RCA 20%, EF 60%. NST 09/2017 at [facility] was negative for ischemia and infarction. She denies any CP and dyspnea. EKG shows sinus brady, 55 bpm. No ST abnormalities. She denies CP and dyspnea.  2. HTN: controlled on current regimen at 120/62. Management per PCP.   3. HLD: lipid panel followed by PCP. No recent lipid panel on file in electronic file for my review.   4. DM: followed by PCP.   5. Knee OA: severe Rt knee OA, affecting ADLs. Failed conservative therapies. Awaiting TKR.   6. Preoperative Assessment: Pt with mild, nonobstructive CAD on cath in 2009 and recent low risk nonischemic stress test at [facility], within the past year. She has had no recent CP or dyspnea. EKG nonischemic. BP normal. Physical exam is benign. I've discussed pt case with Dr. Marlou Porch, DOD. Given her recent low risk stress test and lack of ischemic symptoms, she can be cleared for knee replacement surgery w/o need for additional cardiac testing. However, given her age and mild nonobstructive CAD, she is considered moderate risk for complications. Cardiology will be available  if needed during the perioperative period.    Follow-Up in 1 yr w/ Dr. Ezequiel Essex Alexis Rios,  [facility] 09/12/2018 10:50 AM

## 2018-09-20 ENCOUNTER — Ambulatory Visit: Payer: Self-pay | Admitting: Orthopedic Surgery

## 2018-09-23 ENCOUNTER — Ambulatory Visit: Payer: Self-pay | Admitting: Orthopedic Surgery

## 2018-09-23 NOTE — H&P (Signed)
TOTAL KNEE ADMISSION H&P  Patient is being admitted for right total knee arthroplasty.  Subjective:  Chief Complaint:right knee pain.  HPI: Alexis Rios, 72 y.o. female, has a history of pain and functional disability in the right knee due to arthritis and has failed non-surgical conservative treatments for greater than 12 weeks to includeNSAID's and/or analgesics, corticosteriod injections, flexibility and strengthening excercises, use of assistive devices, weight reduction as appropriate and activity modification.  Onset of symptoms was gradual, starting 4 years ago with gradually worsening course since that time. The patient noted no past surgery on the right knee(s).  Patient currently rates pain in the right knee(s) at 10 out of 10 with activity. Patient has night pain, worsening of pain with activity and weight bearing, pain that interferes with activities of daily living, pain with passive range of motion, crepitus and joint swelling.  Patient has evidence of subchondral cysts, subchondral sclerosis, periarticular osteophytes and joint space narrowing by imaging studies.  There is no active infection.  Patient Active Problem List   Diagnosis Date Noted  . Skin yeast infection 07/05/2015  . CAD (coronary artery disease)   . Chest pain 11/13/2013  . Intermittent lightheadedness 11/13/2013  . GERD (gastroesophageal reflux disease) 09/09/2012  . Obesity 06/25/2012  . Disorder of shoulder 06/04/2012  . Asthma 05/28/2012  . Diastolic dysfunction 02/72/5366  . OSA (obstructive sleep apnea) 05/10/2012  . Dyspnea 05/07/2012  . Chest pain 08/24/2011  . Dyslipidemia 08/24/2011   Past Medical History:  Diagnosis Date  . Anemia   . Anxiety   . Arthritis   . Asthma   . CAD (coronary artery disease)    Cath 2009, AV groove CX 30%, RCA 20 %  . Candidiasis of skin and nail   . Carpal tunnel syndrome   . Chronic back pain   . Chronic headaches    migraines  . Complication of anesthesia     if no breathing tx before anesthesia wakes with asthma  . COPD (chronic obstructive pulmonary disease) ([place])   . Depression   . Diabetes mellitus   . Diverticulosis   . Dyspnea    Cardiopulmonary stress test 2013: Normal functional capacity when compared to matched sedentary norms; obesity and deconditioning primary factors contributing to overall limitation; ?OHS.  . Gastroparesis    ?  Marland Kitchen GERD (gastroesophageal reflux disease)   . H/O echocardiogram    Echo 09/2011: Mild LVH, EF 44-03%, grade 1 diastolic dysfunction, trivial AI, mild MR, mild LAE.  . H/O exercise stress test    Dobutamine Myoview 09/2011: No scar or ischemia, no EKG changes, study not gated.  . HH (hiatus hernia)   . History of gallstones   . History of pneumonia   . Hypercholesteremia   . Hypertension   . IBS (irritable bowel syndrome)   . Osteoporosis   . Peripheral neuropathy   . Pneumonia   . S/P cardiac catheterization    LHC 03/2008: Mid AV groove circumflex 30%, mid RCA 20%, EF 60%  . Seasonal allergies   . Skin cancer    scc/bcc  . Skin yeast infection 07/05/2015  . Sleep apnea    cpap wwith O2 54yrs  . Stones in the urinary tract   . Subclinical hypothyroidism   . UTI (urinary tract infection)     Past Surgical History:  Procedure Laterality Date  . ABDOMINAL HYSTERECTOMY     partial  . APPENDECTOMY    . BACK SURGERY     x3  .  BACK SURGERY    . BREAST SURGERY     lump x 2 R  . CHOLECYSTECTOMY    . FOOT SURGERY     R  . KNEE SURGERY     R  . NOSE SURGERY     skin cancer excision  . shoulder surg     x2 R  . TOE AMPUTATION     right  . TONSILLECTOMY    . UMBILICAL HERNIA REPAIR    . WRIST SURGERY     L    Current Outpatient Medications  Medication Sig Dispense Refill Last Dose  . albuterol (PROVENTIL) (2.5 MG/3ML) 0.083% nebulizer solution Take 2.5 mg by nebulization every 6 (six) hours as needed for shortness of breath.    Taking  . albuterol (VENTOLIN HFA) 108 (90 Base)  MCG/ACT inhaler Inhale 2 puffs into the lungs every 4 (four) hours as needed for wheezing or shortness of breath. 1 Inhaler 0 Taking  . ALPRAZolam (XANAX) 0.25 MG tablet Take 0.25 mg by mouth 2 (two) times daily.   Taking  . budesonide-formoterol (SYMBICORT) 80-4.5 MCG/ACT inhaler Symbicort 80 mcg-4.5 mcg/actuation HFA aerosol inhaler   Taking  . citalopram (CELEXA) 40 MG tablet Take 40 mg by mouth daily.    Taking  . cyanocobalamin (,VITAMIN B-12,) 1000 MCG/ML injection Inject 1,000 mcg into the muscle every 30 (thirty) days.    Taking  . gabapentin (NEURONTIN) 300 MG capsule Take 300 mg by mouth 3 (three) times daily.    Taking  . HYDROcodone-acetaminophen (NORCO/VICODIN) 5-325 MG tablet Take 1 tablet by mouth every 6 (six) hours as needed. 15 tablet 0 Taking  . mometasone-formoterol (DULERA) 200-5 MCG/ACT AERO Inhale 2 puffs into the lungs 2 (two) times daily. 13 g 0 Taking  . mupirocin ointment (BACTROBAN) 2 % Apply 1 application topically daily.   Taking  . omeprazole (PRILOSEC) 40 MG capsule Take one capsule every morning as directed. 90 capsule 1 Taking  . ranitidine (ZANTAC) 300 MG tablet Take 1 tablet (300 mg total) by mouth at bedtime. 90 tablet 1 Taking  . rosuvastatin (CRESTOR) 5 MG tablet Take 5 mg by mouth once a week.  5 Taking  . sucralfate (CARAFATE) 1 g tablet Take 1 tablet (1 g total) by mouth 4 (four) times daily. 360 tablet 1 Taking  . topiramate (TOPAMAX) 100 MG tablet Take 100 mg by mouth at bedtime.   Taking  . triamcinolone cream (KENALOG) 0.1 % Apply 1 application topically 2 (two) times daily.    Taking  . Vitamin D, Ergocalciferol, (DRISDOL) 50000 units CAPS capsule Take by mouth every 7 (seven) days.    Taking   No current facility-administered medications for this visit.    Allergies  Allergen Reactions  . Sulfa Antibiotics Swelling    Mouth and throat   . Codeine Itching  . Cymbalta [Duloxetine Hcl] Other (See Comments)    Cannot urinate  . Keflet [Cephalexin  Monohydrate] Itching  . Keflex [Cephalexin]   . Macrodantin [Nitrofurantoin Macrocrystal] Nausea And Vomiting  . Mucinex [Guaifenesin Er] Other (See Comments)    Urinary retention  . Tape Rash    Social History   Tobacco Use  . Smoking status: Never Smoker  . Smokeless tobacco: Never Used  Substance Use Topics  . Alcohol use: No    Family History  Problem Relation Age of Onset  . Allergies Daughter   . Liver cancer Brother   . Hypertension Brother   . Diabetes Brother   .  Leukemia Brother   . Emphysema Brother   . Heart disease Mother   . Heart disease Father   . Stroke Father   . Hypertension Sister   . Diabetes Sister   . Diabetes Other        all brothers and sister x 5     Review of Systems  Constitutional: Positive for malaise/fatigue.  HENT: Negative.   Eyes: Negative.   Respiratory: Positive for shortness of breath.   Cardiovascular: Negative.   Gastrointestinal: Positive for heartburn.  Genitourinary: Negative.   Musculoskeletal: Positive for back pain, joint pain and myalgias.  Skin: Negative.   Neurological: Negative.   Endo/Heme/Allergies: Negative.   Psychiatric/Behavioral: Negative.     Objective:  Physical Exam  Vitals reviewed. Constitutional: She is oriented to person, place, and time. She appears well-developed and well-nourished.  HENT:  Head: Normocephalic and atraumatic.  Eyes: Pupils are equal, round, and reactive to light. Conjunctivae and EOM are normal.  Neck: Normal range of motion. Neck supple.  Cardiovascular: Normal rate, regular rhythm and intact distal pulses.  Respiratory: Effort normal. No respiratory distress.  GI: Soft. She exhibits no distension.  Genitourinary:  Genitourinary Comments: deferred  Musculoskeletal:       Right knee: She exhibits decreased range of motion, swelling, effusion and abnormal alignment. Tenderness found. Medial joint line and lateral joint line tenderness noted.  Neurological: She is alert and  oriented to person, place, and time. She has normal reflexes.  Skin: Skin is warm and dry.  Psychiatric: She has a normal mood and affect. Her behavior is normal. Judgment and thought content normal.    Vital signs in last 24 hours: @VSRANGES @  Labs:   Estimated body mass index is 37.62 kg/m as calculated from the following:   Height as of 09/12/18: 5\' 1"  (1.549 m).   Weight as of 09/12/18: 90.3 kg.   Imaging Review Plain radiographs demonstrate severe degenerative joint disease of the right knee(s). The overall alignment issignificant varus. The bone quality appears to be adequate for age and reported activity level.   Preoperative templating of the joint replacement has been completed, documented, and submitted to the Operating Room personnel in order to optimize intra-operative equipment management.   Anticipated LOS equal to or greater than 2 midnights due to - Age 71 and older with one or more of the following:  - Obesity  - Expected need for hospital services (PT, OT, Nursing) required for safe  discharge  - Anticipated need for postoperative skilled nursing care or inpatient rehab  - Active co-morbidities: None OR   - Unanticipated findings during/Post Surgery: None  - Patient is a high risk of re-admission due to: None     Assessment/Plan:  End stage arthritis, right knee   The patient history, physical examination, clinical judgment of the provider and imaging studies are consistent with end stage degenerative joint disease of the right knee(s) and total knee arthroplasty is deemed medically necessary. The treatment options including medical management, injection therapy arthroscopy and arthroplasty were discussed at length. The risks and benefits of total knee arthroplasty were presented and reviewed. The risks due to aseptic loosening, infection, stiffness, patella tracking problems, thromboembolic complications and other imponderables were discussed. The patient  acknowledged the explanation, agreed to proceed with the plan and consent was signed. Patient is being admitted for inpatient treatment for surgery, pain control, PT, OT, prophylactic antibiotics, VTE prophylaxis, progressive ambulation and ADL's and discharge planning. The patient is planning to be discharged home  with outpatient PT

## 2018-09-23 NOTE — H&P (View-Only) (Signed)
TOTAL KNEE ADMISSION H&P  Patient is being admitted for right total knee arthroplasty.  Subjective:  Chief Complaint:right knee pain.  HPI: Alexis Rios, 72 y.o. female, has a history of pain and functional disability in the right knee due to arthritis and has failed non-surgical conservative treatments for greater than 12 weeks to includeNSAID's and/or analgesics, corticosteriod injections, flexibility and strengthening excercises, use of assistive devices, weight reduction as appropriate and activity modification.  Onset of symptoms was gradual, starting 4 years ago with gradually worsening course since that time. The patient noted no past surgery on the right knee(s).  Patient currently rates pain in the right knee(s) at 10 out of 10 with activity. Patient has night pain, worsening of pain with activity and weight bearing, pain that interferes with activities of daily living, pain with passive range of motion, crepitus and joint swelling.  Patient has evidence of subchondral cysts, subchondral sclerosis, periarticular osteophytes and joint space narrowing by imaging studies.  There is no active infection.  Patient Active Problem List   Diagnosis Date Noted  . Skin yeast infection 07/05/2015  . CAD (coronary artery disease)   . Chest pain 11/13/2013  . Intermittent lightheadedness 11/13/2013  . GERD (gastroesophageal reflux disease) 09/09/2012  . Obesity 06/25/2012  . Disorder of shoulder 06/04/2012  . Asthma 05/28/2012  . Diastolic dysfunction 67/89/3810  . OSA (obstructive sleep apnea) 05/10/2012  . Dyspnea 05/07/2012  . Chest pain 08/24/2011  . Dyslipidemia 08/24/2011   Past Medical History:  Diagnosis Date  . Anemia   . Anxiety   . Arthritis   . Asthma   . CAD (coronary artery disease)    Cath 2009, AV groove CX 30%, RCA 20 %  . Candidiasis of skin and nail   . Carpal tunnel syndrome   . Chronic back pain   . Chronic headaches    migraines  . Complication of anesthesia     if no breathing tx before anesthesia wakes with asthma  . COPD (chronic obstructive pulmonary disease) ([place])   . Depression   . Diabetes mellitus   . Diverticulosis   . Dyspnea    Cardiopulmonary stress test 2013: Normal functional capacity when compared to matched sedentary norms; obesity and deconditioning primary factors contributing to overall limitation; ?OHS.  . Gastroparesis    ?  Marland Kitchen GERD (gastroesophageal reflux disease)   . H/O echocardiogram    Echo 09/2011: Mild LVH, EF 17-51%, grade 1 diastolic dysfunction, trivial AI, mild MR, mild LAE.  . H/O exercise stress test    Dobutamine Myoview 09/2011: No scar or ischemia, no EKG changes, study not gated.  . HH (hiatus hernia)   . History of gallstones   . History of pneumonia   . Hypercholesteremia   . Hypertension   . IBS (irritable bowel syndrome)   . Osteoporosis   . Peripheral neuropathy   . Pneumonia   . S/P cardiac catheterization    LHC 03/2008: Mid AV groove circumflex 30%, mid RCA 20%, EF 60%  . Seasonal allergies   . Skin cancer    scc/bcc  . Skin yeast infection 07/05/2015  . Sleep apnea    cpap wwith O2 43yrs  . Stones in the urinary tract   . Subclinical hypothyroidism   . UTI (urinary tract infection)     Past Surgical History:  Procedure Laterality Date  . ABDOMINAL HYSTERECTOMY     partial  . APPENDECTOMY    . BACK SURGERY     x3  .  BACK SURGERY    . BREAST SURGERY     lump x 2 R  . CHOLECYSTECTOMY    . FOOT SURGERY     R  . KNEE SURGERY     R  . NOSE SURGERY     skin cancer excision  . shoulder surg     x2 R  . TOE AMPUTATION     right  . TONSILLECTOMY    . UMBILICAL HERNIA REPAIR    . WRIST SURGERY     L    Current Outpatient Medications  Medication Sig Dispense Refill Last Dose  . albuterol (PROVENTIL) (2.5 MG/3ML) 0.083% nebulizer solution Take 2.5 mg by nebulization every 6 (six) hours as needed for shortness of breath.    Taking  . albuterol (VENTOLIN HFA) 108 (90 Base)  MCG/ACT inhaler Inhale 2 puffs into the lungs every 4 (four) hours as needed for wheezing or shortness of breath. 1 Inhaler 0 Taking  . ALPRAZolam (XANAX) 0.25 MG tablet Take 0.25 mg by mouth 2 (two) times daily.   Taking  . budesonide-formoterol (SYMBICORT) 80-4.5 MCG/ACT inhaler Symbicort 80 mcg-4.5 mcg/actuation HFA aerosol inhaler   Taking  . citalopram (CELEXA) 40 MG tablet Take 40 mg by mouth daily.    Taking  . cyanocobalamin (,VITAMIN B-12,) 1000 MCG/ML injection Inject 1,000 mcg into the muscle every 30 (thirty) days.    Taking  . gabapentin (NEURONTIN) 300 MG capsule Take 300 mg by mouth 3 (three) times daily.    Taking  . HYDROcodone-acetaminophen (NORCO/VICODIN) 5-325 MG tablet Take 1 tablet by mouth every 6 (six) hours as needed. 15 tablet 0 Taking  . mometasone-formoterol (DULERA) 200-5 MCG/ACT AERO Inhale 2 puffs into the lungs 2 (two) times daily. 13 g 0 Taking  . mupirocin ointment (BACTROBAN) 2 % Apply 1 application topically daily.   Taking  . omeprazole (PRILOSEC) 40 MG capsule Take one capsule every morning as directed. 90 capsule 1 Taking  . ranitidine (ZANTAC) 300 MG tablet Take 1 tablet (300 mg total) by mouth at bedtime. 90 tablet 1 Taking  . rosuvastatin (CRESTOR) 5 MG tablet Take 5 mg by mouth once a week.  5 Taking  . sucralfate (CARAFATE) 1 g tablet Take 1 tablet (1 g total) by mouth 4 (four) times daily. 360 tablet 1 Taking  . topiramate (TOPAMAX) 100 MG tablet Take 100 mg by mouth at bedtime.   Taking  . triamcinolone cream (KENALOG) 0.1 % Apply 1 application topically 2 (two) times daily.    Taking  . Vitamin D, Ergocalciferol, (DRISDOL) 50000 units CAPS capsule Take by mouth every 7 (seven) days.    Taking   No current facility-administered medications for this visit.    Allergies  Allergen Reactions  . Sulfa Antibiotics Swelling    Mouth and throat   . Codeine Itching  . Cymbalta [Duloxetine Hcl] Other (See Comments)    Cannot urinate  . Keflet [Cephalexin  Monohydrate] Itching  . Keflex [Cephalexin]   . Macrodantin [Nitrofurantoin Macrocrystal] Nausea And Vomiting  . Mucinex [Guaifenesin Er] Other (See Comments)    Urinary retention  . Tape Rash    Social History   Tobacco Use  . Smoking status: Never Smoker  . Smokeless tobacco: Never Used  Substance Use Topics  . Alcohol use: No    Family History  Problem Relation Age of Onset  . Allergies Daughter   . Liver cancer Brother   . Hypertension Brother   . Diabetes Brother   .  Leukemia Brother   . Emphysema Brother   . Heart disease Mother   . Heart disease Father   . Stroke Father   . Hypertension Sister   . Diabetes Sister   . Diabetes Other        all brothers and sister x 5     Review of Systems  Constitutional: Positive for malaise/fatigue.  HENT: Negative.   Eyes: Negative.   Respiratory: Positive for shortness of breath.   Cardiovascular: Negative.   Gastrointestinal: Positive for heartburn.  Genitourinary: Negative.   Musculoskeletal: Positive for back pain, joint pain and myalgias.  Skin: Negative.   Neurological: Negative.   Endo/Heme/Allergies: Negative.   Psychiatric/Behavioral: Negative.     Objective:  Physical Exam  Vitals reviewed. Constitutional: She is oriented to person, place, and time. She appears well-developed and well-nourished.  HENT:  Head: Normocephalic and atraumatic.  Eyes: Pupils are equal, round, and reactive to light. Conjunctivae and EOM are normal.  Neck: Normal range of motion. Neck supple.  Cardiovascular: Normal rate, regular rhythm and intact distal pulses.  Respiratory: Effort normal. No respiratory distress.  GI: Soft. She exhibits no distension.  Genitourinary:  Genitourinary Comments: deferred  Musculoskeletal:       Right knee: She exhibits decreased range of motion, swelling, effusion and abnormal alignment. Tenderness found. Medial joint line and lateral joint line tenderness noted.  Neurological: She is alert and  oriented to person, place, and time. She has normal reflexes.  Skin: Skin is warm and dry.  Psychiatric: She has a normal mood and affect. Her behavior is normal. Judgment and thought content normal.    Vital signs in last 24 hours: @VSRANGES @  Labs:   Estimated body mass index is 37.62 kg/m as calculated from the following:   Height as of 09/12/18: 5\' 1"  (1.549 m).   Weight as of 09/12/18: 90.3 kg.   Imaging Review Plain radiographs demonstrate severe degenerative joint disease of the right knee(s). The overall alignment issignificant varus. The bone quality appears to be adequate for age and reported activity level.   Preoperative templating of the joint replacement has been completed, documented, and submitted to the Operating Room personnel in order to optimize intra-operative equipment management.   Anticipated LOS equal to or greater than 2 midnights due to - Age 44 and older with one or more of the following:  - Obesity  - Expected need for hospital services (PT, OT, Nursing) required for safe  discharge  - Anticipated need for postoperative skilled nursing care or inpatient rehab  - Active co-morbidities: None OR   - Unanticipated findings during/Post Surgery: None  - Patient is a high risk of re-admission due to: None     Assessment/Plan:  End stage arthritis, right knee   The patient history, physical examination, clinical judgment of the provider and imaging studies are consistent with end stage degenerative joint disease of the right knee(s) and total knee arthroplasty is deemed medically necessary. The treatment options including medical management, injection therapy arthroscopy and arthroplasty were discussed at length. The risks and benefits of total knee arthroplasty were presented and reviewed. The risks due to aseptic loosening, infection, stiffness, patella tracking problems, thromboembolic complications and other imponderables were discussed. The patient  acknowledged the explanation, agreed to proceed with the plan and consent was signed. Patient is being admitted for inpatient treatment for surgery, pain control, PT, OT, prophylactic antibiotics, VTE prophylaxis, progressive ambulation and ADL's and discharge planning. The patient is planning to be discharged home  with outpatient PT

## 2018-09-24 DIAGNOSIS — K219 Gastro-esophageal reflux disease without esophagitis: Secondary | ICD-10-CM | POA: Diagnosis not present

## 2018-09-24 DIAGNOSIS — J449 Chronic obstructive pulmonary disease, unspecified: Secondary | ICD-10-CM | POA: Diagnosis not present

## 2018-09-24 DIAGNOSIS — I10 Essential (primary) hypertension: Secondary | ICD-10-CM | POA: Diagnosis not present

## 2018-09-24 DIAGNOSIS — E782 Mixed hyperlipidemia: Secondary | ICD-10-CM | POA: Diagnosis not present

## 2018-09-24 DIAGNOSIS — E1121 Type 2 diabetes mellitus with diabetic nephropathy: Secondary | ICD-10-CM | POA: Diagnosis not present

## 2018-09-24 DIAGNOSIS — Z0000 Encounter for general adult medical examination without abnormal findings: Secondary | ICD-10-CM | POA: Diagnosis not present

## 2018-09-25 ENCOUNTER — Encounter (HOSPITAL_COMMUNITY): Payer: Self-pay

## 2018-09-25 ENCOUNTER — Other Ambulatory Visit (HOSPITAL_COMMUNITY): Payer: Self-pay | Admitting: *Deleted

## 2018-09-25 NOTE — Progress Notes (Signed)
EKG 09-22-18 EPIC  CARDIAC CLEARANCE NOTE BRITTANY SIMMONS NP Epic AND ON CHART MEDICAL CLEARANCE NOTE DR [facility] HALL 07-23-18 ON CHART LOV DR [facility] HALL 07-22-18 ON CHART

## 2018-09-25 NOTE — Patient Instructions (Addendum)
Babs Dabbs Rostro  09/25/2018   Your procedure is scheduled on: 10-10-18  Report to [facility]  Entrance  Report to admitting at 530 AM    Call this number if you have problems the morning of surgery [phone]   Remember: Do not eat food or drink liquids :After Midnight. BRUSH YOUR TEETH MORNING OF SURGERY AND RINSE YOUR MOUTH OUT, NO CHEWING GUM CANDY OR MINTS.             BRING CPAP MASK AND TUBING  Take these medicines the morning of surgery with A SIP OF WATER: ALPRAZOLAM (XAANX), ALBUTEROL NEBULIZER IF NEEDED, ALBUTEROL INHALER IF NEEDED AND BEING INHALER, SYMBICORT, OMEPRAZOLE (PRILOSEC), TYLENOL              You may not have any metal on your body including hair pins and              piercings  Do not wear jewelry, make-up, lotions, powders or perfumes, deodorant             Do not wear nail polish.  Do not shave  48 hours prior to surgery.               Do not bring valuables to the hospital. [place].  Contacts, dentures or bridgework may not be worn into surgery.  Leave suitcase in the car. After surgery it may be brought to your room.                  Please read over the following fact sheets you were given: _____________________________________________________________________             [facility] - Preparing for Surgery Before surgery, you can play an important role.  Because skin is not sterile, your skin needs to be as free of germs as possible.  You can reduce the number of germs on your skin by washing with CHG (chlorahexidine gluconate) soap before surgery.  CHG is an antiseptic cleaner which kills germs and bonds with the skin to continue killing germs even after washing. Please DO NOT use if you have an allergy to CHG or antibacterial soaps.  If your skin becomes reddened/irritated stop using the CHG and inform your nurse when you arrive at Short Stay. Do not shave (including  legs and underarms) for at least 48 hours prior to the first CHG shower.  You may shave your face/neck. Please follow these instructions carefully:  1.  Shower with CHG Soap the night before surgery and the  morning of Surgery.  2.  If you choose to wash your hair, wash your hair first as usual with your  normal  shampoo.  3.  After you shampoo, rinse your hair and body thoroughly to remove the  shampoo.                           4.  Use CHG as you would any other liquid soap.  You can apply chg directly  to the skin and wash                       Gently with a scrungie or clean washcloth.  5.  Apply the CHG Soap to your body  ONLY FROM THE NECK DOWN.   Do not use on face/ open                           Wound or open sores. Avoid contact with eyes, ears mouth and genitals (private parts).                       Wash face,  Genitals (private parts) with your normal soap.             6.  Wash thoroughly, paying special attention to the area where your surgery  will be performed.  7.  Thoroughly rinse your body with warm water from the neck down.  8.  DO NOT shower/wash with your normal soap after using and rinsing off  the CHG Soap.                9.  Pat yourself dry with a clean towel.            10.  Wear clean pajamas.            11.  Place clean sheets on your bed the night of your first shower and do not  sleep with pets. Day of Surgery : Do not apply any lotions/deodorants the morning of surgery.  Please wear clean clothes to the hospital/surgery center.  FAILURE TO FOLLOW THESE INSTRUCTIONS MAY RESULT IN THE CANCELLATION OF YOUR SURGERY PATIENT SIGNATURE_________________________________  NURSE SIGNATURE__________________________________  ________________________________________________________________________   Adam Phenix  An incentive spirometer is a tool that can help keep your lungs clear and active. This tool measures how well you are filling your lungs with each breath.  Taking long deep breaths may help reverse or decrease the chance of developing breathing (pulmonary) problems (especially infection) following:  A long period of time when you are unable to move or be active. BEFORE THE PROCEDURE   If the spirometer includes an indicator to show your best effort, your nurse or respiratory therapist will set it to a desired goal.  If possible, sit up straight or lean slightly forward. Try not to slouch.  Hold the incentive spirometer in an upright position. INSTRUCTIONS FOR USE  1. Sit on the edge of your bed if possible, or sit up as far as you can in bed or on a chair. 2. Hold the incentive spirometer in an upright position. 3. Breathe out normally. 4. Place the mouthpiece in your mouth and seal your lips tightly around it. 5. Breathe in slowly and as deeply as possible, raising the piston or the ball toward the top of the column. 6. Hold your breath for 3-5 seconds or for as long as possible. Allow the piston or ball to fall to the bottom of the column. 7. Remove the mouthpiece from your mouth and breathe out normally. 8. Rest for a few seconds and repeat Steps 1 through 7 at least 10 times every 1-2 hours when you are awake. Take your time and take a few normal breaths between deep breaths. 9. The spirometer may include an indicator to show your best effort. Use the indicator as a goal to work toward during each repetition. 10. After each set of 10 deep breaths, practice coughing to be sure your lungs are clear. If you have an incision (the cut made at the time of surgery), support your incision when coughing by placing a pillow or rolled up towels firmly against it. Once  you are able to get out of bed, walk around indoors and cough well. You may stop using the incentive spirometer when instructed by your caregiver.  RISKS AND COMPLICATIONS  Take your time so you do not get dizzy or light-headed.  If you are in pain, you may need to take or ask for pain  medication before doing incentive spirometry. It is harder to take a deep breath if you are having pain. AFTER USE  Rest and breathe slowly and easily.  It can be helpful to keep track of a log of your progress. Your caregiver can provide you with a simple table to help with this. If you are using the spirometer at home, follow these instructions: [place] IF:   You are having difficultly using the spirometer.  You have trouble using the spirometer as often as instructed.  Your pain medication is not giving enough relief while using the spirometer.  You develop fever of 100.5 F (38.1 C) or higher. SEEK IMMEDIATE MEDICAL CARE IF:   You cough up bloody sputum that had not been present before.  You develop fever of 102 F (38.9 C) or greater.  You develop worsening pain at or near the incision site. MAKE SURE YOU:   Understand these instructions.  Will watch your condition.  Will get help right away if you are not doing well or get worse. Document Released: 04/09/2007 Document Revised: 02/19/2012 Document Reviewed: 06/10/2007 ExitCare Patient Information 2014 Exit[employer], [place].   ________________________________________________________________________  WHAT IS A BLOOD TRANSFUSION? Blood Transfusion Information  A transfusion is the replacement of blood or some of its parts. Blood is made up of multiple cells which provide different functions.  Red blood cells carry oxygen and are used for blood loss replacement.  White blood cells fight against infection.  Platelets control bleeding.  Plasma helps clot blood.  Other blood products are available for specialized needs, such as hemophilia or other clotting disorders. BEFORE THE TRANSFUSION  Who gives blood for transfusions?   Healthy volunteers who are fully evaluated to make sure their blood is safe. This is blood bank blood. Transfusion therapy is the safest it has ever been in the practice of medicine.  Before blood is taken from a donor, a complete history is taken to make sure that person has no history of diseases nor engages in risky social behavior (examples are intravenous drug use or sexual activity with multiple partners). The donor's travel history is screened to minimize risk of transmitting infections, such as malaria. The donated blood is tested for signs of infectious diseases, such as HIV and hepatitis. The blood is then tested to be sure it is compatible with you in order to minimize the chance of a transfusion reaction. If you or a relative donates blood, this is often done in anticipation of surgery and is not appropriate for emergency situations. It takes many days to process the donated blood. RISKS AND COMPLICATIONS Although transfusion therapy is very safe and saves many lives, the main dangers of transfusion include:   Getting an infectious disease.  Developing a transfusion reaction. This is an allergic reaction to something in the blood you were given. Every precaution is taken to prevent this. The decision to have a blood transfusion has been considered carefully by your caregiver before blood is given. Blood is not given unless the benefits outweigh the risks. AFTER THE TRANSFUSION  Right after receiving a blood transfusion, you will usually feel much better and more energetic. This  is especially true if your red blood cells have gotten low (anemic). The transfusion raises the level of the red blood cells which carry oxygen, and this usually causes an energy increase.  The nurse administering the transfusion will monitor you carefully for complications. HOME CARE INSTRUCTIONS  No special instructions are needed after a transfusion. You may find your energy is better. Speak with your caregiver about any limitations on activity for underlying diseases you may have. SEEK MEDICAL CARE IF:   Your condition is not improving after your transfusion.  You develop redness or  irritation at the intravenous (IV) site. SEEK IMMEDIATE MEDICAL CARE IF:  Any of the following symptoms occur over the next 12 hours:  Shaking chills.  You have a temperature by mouth above 102 F (38.9 C), not controlled by medicine.  Chest, back, or muscle pain.  People around you feel you are not acting correctly or are confused.  Shortness of breath or difficulty breathing.  Dizziness and fainting.  You get a rash or develop hives.  You have a decrease in urine output.  Your urine turns a dark color or changes to pink, red, or brown. Any of the following symptoms occur over the next 10 days:  You have a temperature by mouth above 102 F (38.9 C), not controlled by medicine.  Shortness of breath.  Weakness after normal activity.  The white part of the eye turns yellow (jaundice).  You have a decrease in the amount of urine or are urinating less often.  Your urine turns a dark color or changes to pink, red, or brown. Document Released: 11/24/2000 Document Revised: 02/19/2012 Document Reviewed: 07/13/2008 [facility] Patient Information 2014 [place], [place].  _______________________________________________________________________

## 2018-10-02 ENCOUNTER — Other Ambulatory Visit: Payer: Self-pay

## 2018-10-02 ENCOUNTER — Encounter (HOSPITAL_COMMUNITY)
Admission: RE | Admit: 2018-10-02 | Discharge: 2018-10-02 | Disposition: A | Discharge status: Home / Self Care | Payer: Medicare Other | Source: Ambulatory Visit | Attending: Orthopedic Surgery | Admitting: Orthopedic Surgery

## 2018-10-02 ENCOUNTER — Encounter (HOSPITAL_COMMUNITY): Payer: Self-pay

## 2018-10-02 DIAGNOSIS — Z01812 Encounter for preprocedural laboratory examination: Secondary | ICD-10-CM | POA: Insufficient documentation

## 2018-10-02 DIAGNOSIS — M1711 Unilateral primary osteoarthritis, right knee: Secondary | ICD-10-CM | POA: Diagnosis not present

## 2018-10-02 LAB — BASIC METABOLIC PANEL
Anion gap: 7 (ref 5–15)
BUN: 18 mg/dL (ref 8–23)
CALCIUM: 9 mg/dL (ref 8.9–10.3)
CO2: 25 mmol/L (ref 22–32)
CREATININE: 0.8 mg/dL (ref 0.44–1.00)
Chloride: 104 mmol/L (ref 98–111)
GFR calc Af Amer: >60 mL/min (ref >60)
Glucose, Bld: 92 mg/dL (ref 70–99)
Potassium: 4.5 mmol/L (ref 3.5–5.1)
SODIUM: 136 mmol/L (ref 135–145)

## 2018-10-02 LAB — CBC
HCT: 42.2 % (ref 36.0–46.0)
Hemoglobin: 13 g/dL (ref 12.0–15.0)
MCH: 29.3 pg (ref 26.0–34.0)
MCHC: 30.8 g/dL (ref 30.0–36.0)
MCV: 95.3 fL (ref 80.0–100.0)
NRBC: 0 % (ref 0.0–0.2)
PLATELETS: 210 10*3/uL (ref 150–400)
RBC: 4.43 MIL/uL (ref 3.87–5.11)
RDW: 14.5 % (ref 11.5–15.5)
WBC: 6.4 10*3/uL (ref 4.0–10.5)

## 2018-10-02 LAB — GLUCOSE, CAPILLARY: Glucose-Capillary: 92 mg/dL (ref 70–99)

## 2018-10-02 LAB — SURGICAL PCR SCREEN
MRSA, PCR: NEGATIVE
Staphylococcus aureus: NEGATIVE

## 2018-10-02 LAB — HEMOGLOBIN A1C
HEMOGLOBIN A1C: 5.9 % — AB (ref 4.8–5.6)
Mean Plasma Glucose: 122.63 mg/dL

## 2018-10-03 LAB — ABO/RH: ABO/RH(D): A NEG

## 2018-10-04 ENCOUNTER — Other Ambulatory Visit (HOSPITAL_COMMUNITY): Payer: Medicare Other

## 2018-10-09 NOTE — Anesthesia Preprocedure Evaluation (Addendum)
Anesthesia Evaluation  Patient identified by MRN, date of birth, ID band Patient awake    Reviewed: Allergy & Precautions, NPO status , Patient's Chart, lab work & pertinent test results  History of Anesthesia Complications Negative for: history of anesthetic complications  Airway Mallampati: IV  TM Distance: >3 FB Neck ROM: Full    Dental  (+) Missing Some lower molars missing:   Pulmonary asthma , sleep apnea and Continuous Positive Airway Pressure Ventilation , COPD,    Pulmonary exam normal        Cardiovascular hypertension, + CAD  Normal cardiovascular exam     Neuro/Psych PSYCHIATRIC DISORDERS Anxiety Depression negative neurological ROS     GI/Hepatic Neg liver ROS, hiatal hernia, GERD  ,gastroparesis   Endo/Other  diabetesHypothyroidism   Renal/GU negative Renal ROS  negative genitourinary   Musculoskeletal negative musculoskeletal ROS (+)   Abdominal   Peds  Hematology negative hematology ROS (+)   Anesthesia Other Findings   Reproductive/Obstetrics                           Anesthesia Physical Anesthesia Plan  ASA: III  Anesthesia Plan: Spinal   Post-op Pain Management:  [facility] for Post-op pain   Induction:   PONV Risk Score and Plan: 2 and Propofol infusion, Treatment may vary due to age or medical condition and Midazolam  Airway Management Planned: Natural Airway, Nasal Cannula and Simple Face Mask  Additional Equipment: None  Intra-op Plan:   Post-operative Plan:   Informed Consent: I have reviewed the patients History and Physical, chart, labs and discussed the procedure including the risks, benefits and alternatives for the proposed anesthesia with the patient or authorized representative who has indicated his/her understanding and acceptance.     Plan Discussed with:   Anesthesia Plan Comments:       Anesthesia Quick Evaluation

## 2018-10-10 ENCOUNTER — Inpatient Hospital Stay (HOSPITAL_COMMUNITY): Payer: Medicare Other | Admitting: Anesthesiology

## 2018-10-10 ENCOUNTER — Encounter (HOSPITAL_COMMUNITY)
Admission: RE | Disposition: A | Discharge status: Home Health | Payer: Self-pay | Source: Other Acute Inpatient Hospital | Attending: Orthopedic Surgery

## 2018-10-10 ENCOUNTER — Other Ambulatory Visit: Payer: Self-pay

## 2018-10-10 ENCOUNTER — Encounter (HOSPITAL_COMMUNITY): Payer: Self-pay | Admitting: Emergency Medicine

## 2018-10-10 ENCOUNTER — Inpatient Hospital Stay (HOSPITAL_COMMUNITY): Payer: Medicare Other

## 2018-10-10 ENCOUNTER — Inpatient Hospital Stay (HOSPITAL_COMMUNITY)
Admission: RE | Admit: 2018-10-10 | Discharge: 2018-10-13 | DRG: 470 | Disposition: A | Discharge status: Home Health | Payer: Medicare Other | Source: Other Acute Inpatient Hospital | Attending: Orthopedic Surgery | Admitting: Orthopedic Surgery

## 2018-10-10 DIAGNOSIS — Z885 Allergy status to narcotic agent status: Secondary | ICD-10-CM

## 2018-10-10 DIAGNOSIS — Z882 Allergy status to sulfonamides status: Secondary | ICD-10-CM | POA: Diagnosis not present

## 2018-10-10 DIAGNOSIS — E669 Obesity, unspecified: Secondary | ICD-10-CM | POA: Diagnosis present

## 2018-10-10 DIAGNOSIS — J449 Chronic obstructive pulmonary disease, unspecified: Secondary | ICD-10-CM | POA: Diagnosis present

## 2018-10-10 DIAGNOSIS — Z6837 Body mass index (BMI) 37.0-37.9, adult: Secondary | ICD-10-CM | POA: Diagnosis not present

## 2018-10-10 DIAGNOSIS — G4733 Obstructive sleep apnea (adult) (pediatric): Secondary | ICD-10-CM | POA: Diagnosis present

## 2018-10-10 DIAGNOSIS — M1711 Unilateral primary osteoarthritis, right knee: Principal | ICD-10-CM | POA: Diagnosis present

## 2018-10-10 DIAGNOSIS — E1142 Type 2 diabetes mellitus with diabetic polyneuropathy: Secondary | ICD-10-CM | POA: Diagnosis not present

## 2018-10-10 DIAGNOSIS — Z79899 Other long term (current) drug therapy: Secondary | ICD-10-CM | POA: Diagnosis not present

## 2018-10-10 DIAGNOSIS — Z96651 Presence of right artificial knee joint: Secondary | ICD-10-CM

## 2018-10-10 DIAGNOSIS — F329 Major depressive disorder, single episode, unspecified: Secondary | ICD-10-CM | POA: Diagnosis present

## 2018-10-10 DIAGNOSIS — E785 Hyperlipidemia, unspecified: Secondary | ICD-10-CM | POA: Diagnosis not present

## 2018-10-10 DIAGNOSIS — Z881 Allergy status to other antibiotic agents status: Secondary | ICD-10-CM

## 2018-10-10 DIAGNOSIS — D62 Acute posthemorrhagic anemia: Secondary | ICD-10-CM | POA: Diagnosis not present

## 2018-10-10 DIAGNOSIS — Z7951 Long term (current) use of inhaled steroids: Secondary | ICD-10-CM | POA: Diagnosis not present

## 2018-10-10 DIAGNOSIS — Z471 Aftercare following joint replacement surgery: Secondary | ICD-10-CM | POA: Diagnosis not present

## 2018-10-10 DIAGNOSIS — Z85828 Personal history of other malignant neoplasm of skin: Secondary | ICD-10-CM

## 2018-10-10 DIAGNOSIS — I10 Essential (primary) hypertension: Secondary | ICD-10-CM | POA: Diagnosis not present

## 2018-10-10 DIAGNOSIS — Z91048 Other nonmedicinal substance allergy status: Secondary | ICD-10-CM

## 2018-10-10 DIAGNOSIS — M810 Age-related osteoporosis without current pathological fracture: Secondary | ICD-10-CM | POA: Diagnosis present

## 2018-10-10 DIAGNOSIS — Z89421 Acquired absence of other right toe(s): Secondary | ICD-10-CM | POA: Diagnosis not present

## 2018-10-10 DIAGNOSIS — E039 Hypothyroidism, unspecified: Secondary | ICD-10-CM | POA: Diagnosis not present

## 2018-10-10 DIAGNOSIS — I2510 Atherosclerotic heart disease of native coronary artery without angina pectoris: Secondary | ICD-10-CM | POA: Diagnosis not present

## 2018-10-10 DIAGNOSIS — G8918 Other acute postprocedural pain: Secondary | ICD-10-CM | POA: Diagnosis not present

## 2018-10-10 DIAGNOSIS — Z888 Allergy status to other drugs, medicaments and biological substances status: Secondary | ICD-10-CM

## 2018-10-10 HISTORY — PX: KNEE ARTHROPLASTY: SHX992

## 2018-10-10 LAB — TYPE AND SCREEN
ABO/RH(D): A NEG
ANTIBODY SCREEN: NEGATIVE

## 2018-10-10 LAB — GLUCOSE, CAPILLARY
GLUCOSE-CAPILLARY: 129 mg/dL — AB (ref 70–99)
Glucose-Capillary: 99 mg/dL (ref 70–99)

## 2018-10-10 SURGERY — ARTHROPLASTY, KNEE, TOTAL, USING IMAGELESS COMPUTER-ASSISTED NAVIGATION
Anesthesia: General | Site: Knee | Laterality: Right

## 2018-10-10 MED ORDER — LISINOPRIL 5 MG PO TABS
5.0000 mg | ORAL_TABLET | Freq: Every day | ORAL | Status: DC
  Administered 2018-10-10 – 2018-10-13 (×2): 5 mg via ORAL
  Filled 2018-10-10 (×2): qty 1

## 2018-10-10 MED ORDER — METOCLOPRAMIDE HCL 5 MG PO TABS: 5.0000 mg | ORAL_TABLET | Freq: Three times a day (TID) | ORAL | Status: DC | PRN

## 2018-10-10 MED ORDER — CLINDAMYCIN PHOSPHATE 600 MG/50ML IV SOLN
600.0000 mg | Freq: Four times a day (QID) | INTRAVENOUS | Status: AC
  Administered 2018-10-10 (×2): 600 mg via INTRAVENOUS
  Filled 2018-10-10 (×2): qty 50

## 2018-10-10 MED ORDER — GABAPENTIN 400 MG PO CAPS
400.0000 mg | ORAL_CAPSULE | Freq: Every day | ORAL | Status: DC
  Administered 2018-10-10 – 2018-10-12 (×3): 400 mg via ORAL
  Filled 2018-10-10 (×3): qty 1

## 2018-10-10 MED ORDER — EPHEDRINE 5 MG/ML INJ
INTRAVENOUS | Status: AC
  Filled 2018-10-10: qty 10

## 2018-10-10 MED ORDER — KETOROLAC TROMETHAMINE 30 MG/ML IJ SOLN
INTRAMUSCULAR | Status: AC
  Filled 2018-10-10: qty 1

## 2018-10-10 MED ORDER — DIPHENHYDRAMINE HCL 12.5 MG/5ML PO ELIX: 12.5000 mg | ORAL_SOLUTION | ORAL | Status: DC | PRN

## 2018-10-10 MED ORDER — ONDANSETRON HCL 4 MG/2ML IJ SOLN
INTRAMUSCULAR | Status: DC | PRN
  Administered 2018-10-10: 4 mg via INTRAVENOUS

## 2018-10-10 MED ORDER — MENTHOL 3 MG MT LOZG: 1.0000 | LOZENGE | OROMUCOSAL | Status: DC | PRN

## 2018-10-10 MED ORDER — ACETAMINOPHEN 10 MG/ML IV SOLN
1000.0000 mg | INTRAVENOUS | Status: AC
  Administered 2018-10-10: 1000 mg via INTRAVENOUS
  Filled 2018-10-10: qty 100

## 2018-10-10 MED ORDER — METOCLOPRAMIDE HCL 5 MG/ML IJ SOLN: 5.0000 mg | Freq: Three times a day (TID) | INTRAMUSCULAR | Status: DC | PRN

## 2018-10-10 MED ORDER — ASPIRIN 81 MG PO CHEW
81.0000 mg | CHEWABLE_TABLET | Freq: Two times a day (BID) | ORAL | Status: DC
  Administered 2018-10-10 – 2018-10-13 (×6): 81 mg via ORAL
  Filled 2018-10-10 (×6): qty 1

## 2018-10-10 MED ORDER — ONDANSETRON HCL 4 MG/2ML IJ SOLN: 4.0000 mg | Freq: Four times a day (QID) | INTRAMUSCULAR | Status: DC | PRN

## 2018-10-10 MED ORDER — FENTANYL CITRATE (PF) 100 MCG/2ML IJ SOLN
25.0000 ug | INTRAMUSCULAR | Status: DC | PRN
  Administered 2018-10-10: 25 ug via INTRAVENOUS
  Administered 2018-10-10: 50 ug via INTRAVENOUS
  Administered 2018-10-10: 25 ug via INTRAVENOUS

## 2018-10-10 MED ORDER — PHENOL 1.4 % MT LIQD
1.0000 | OROMUCOSAL | Status: DC | PRN
  Filled 2018-10-10: qty 177

## 2018-10-10 MED ORDER — SUGAMMADEX SODIUM 200 MG/2ML IV SOLN
INTRAVENOUS | Status: AC
  Filled 2018-10-10: qty 2

## 2018-10-10 MED ORDER — 0.9 % SODIUM CHLORIDE (POUR BTL) OPTIME
TOPICAL | Status: DC | PRN
  Administered 2018-10-10: 1000 mL

## 2018-10-10 MED ORDER — ALUM & MAG HYDROXIDE-SIMETH 200-200-20 MG/5ML PO SUSP: 30.0000 mL | ORAL | Status: DC | PRN

## 2018-10-10 MED ORDER — ALBUTEROL SULFATE HFA 108 (90 BASE) MCG/ACT IN AERS
INHALATION_SPRAY | RESPIRATORY_TRACT | Status: AC
  Filled 2018-10-10: qty 6.7

## 2018-10-10 MED ORDER — PROPOFOL 10 MG/ML IV BOLUS
INTRAVENOUS | Status: DC | PRN
  Administered 2018-10-10: 10 mg via INTRAVENOUS
  Administered 2018-10-10: 20 mg via INTRAVENOUS
  Administered 2018-10-10: 170 mg via INTRAVENOUS

## 2018-10-10 MED ORDER — EPHEDRINE SULFATE-NACL 50-0.9 MG/10ML-% IV SOSY
PREFILLED_SYRINGE | INTRAVENOUS | Status: DC | PRN
  Administered 2018-10-10 (×2): 10 mg via INTRAVENOUS
  Administered 2018-10-10: 5 mg via INTRAVENOUS

## 2018-10-10 MED ORDER — ALBUTEROL SULFATE HFA 108 (90 BASE) MCG/ACT IN AERS: 2.0000 | INHALATION_SPRAY | RESPIRATORY_TRACT | Status: DC | PRN

## 2018-10-10 MED ORDER — ROPIVACAINE HCL 5 MG/ML IJ SOLN
INTRAMUSCULAR | Status: DC | PRN
  Administered 2018-10-10: 30 mL via PERINEURAL

## 2018-10-10 MED ORDER — CLINDAMYCIN PHOSPHATE 900 MG/50ML IV SOLN
900.0000 mg | INTRAVENOUS | Status: AC
  Administered 2018-10-10: 900 mg via INTRAVENOUS
  Filled 2018-10-10: qty 50

## 2018-10-10 MED ORDER — FENTANYL CITRATE (PF) 100 MCG/2ML IJ SOLN
INTRAMUSCULAR | Status: DC | PRN
  Administered 2018-10-10 (×2): 50 ug via INTRAVENOUS

## 2018-10-10 MED ORDER — ROCURONIUM BROMIDE 100 MG/10ML IV SOLN
INTRAVENOUS | Status: DC | PRN
  Administered 2018-10-10: 50 mg via INTRAVENOUS

## 2018-10-10 MED ORDER — SODIUM CHLORIDE 0.9 % IV SOLN: INTRAVENOUS | Status: DC

## 2018-10-10 MED ORDER — HYDROCODONE-ACETAMINOPHEN 5-325 MG PO TABS
1.0000 | ORAL_TABLET | ORAL | Status: DC | PRN
  Administered 2018-10-11 – 2018-10-13 (×8): 2 via ORAL
  Filled 2018-10-10 (×8): qty 2

## 2018-10-10 MED ORDER — PROPOFOL 10 MG/ML IV BOLUS
INTRAVENOUS | Status: AC
  Filled 2018-10-10: qty 60

## 2018-10-10 MED ORDER — KETOROLAC TROMETHAMINE 15 MG/ML IJ SOLN
INTRAMUSCULAR | Status: AC
  Administered 2018-10-10: 7.5 mg via INTRAVENOUS
  Filled 2018-10-10: qty 1

## 2018-10-10 MED ORDER — HYDROCODONE-ACETAMINOPHEN 7.5-325 MG PO TABS
1.0000 | ORAL_TABLET | ORAL | Status: DC | PRN
  Administered 2018-10-10: 1 via ORAL
  Administered 2018-10-10 – 2018-10-13 (×2): 2 via ORAL
  Filled 2018-10-10: qty 2
  Filled 2018-10-10: qty 1
  Filled 2018-10-10: qty 2

## 2018-10-10 MED ORDER — SODIUM CHLORIDE 0.9 % IR SOLN
Status: DC | PRN
  Administered 2018-10-10: 1000 mL
  Administered 2018-10-10: 3000 mL

## 2018-10-10 MED ORDER — DEXAMETHASONE SODIUM PHOSPHATE 10 MG/ML IJ SOLN
10.0000 mg | Freq: Once | INTRAMUSCULAR | Status: AC
  Administered 2018-10-11: 10 mg via INTRAVENOUS
  Filled 2018-10-10: qty 1

## 2018-10-10 MED ORDER — MIRABEGRON ER 25 MG PO TB24
25.0000 mg | ORAL_TABLET | Freq: Every day | ORAL | Status: DC
  Administered 2018-10-12: 25 mg via ORAL
  Filled 2018-10-10 (×3): qty 1

## 2018-10-10 MED ORDER — SODIUM CHLORIDE 0.9 % IJ SOLN
INTRAMUSCULAR | Status: DC | PRN
  Administered 2018-10-10: 30 mL

## 2018-10-10 MED ORDER — LACTATED RINGERS IV SOLN
INTRAVENOUS | Status: DC | PRN
  Administered 2018-10-10 (×2): via INTRAVENOUS

## 2018-10-10 MED ORDER — SODIUM CHLORIDE 0.9 % IV SOLN
INTRAVENOUS | Status: DC
  Administered 2018-10-10 – 2018-10-12 (×5): via INTRAVENOUS

## 2018-10-10 MED ORDER — ALBUTEROL SULFATE (2.5 MG/3ML) 0.083% IN NEBU: 2.5000 mg | INHALATION_SOLUTION | Freq: Four times a day (QID) | RESPIRATORY_TRACT | Status: DC | PRN

## 2018-10-10 MED ORDER — FENTANYL CITRATE (PF) 100 MCG/2ML IJ SOLN
INTRAMUSCULAR | Status: AC
  Filled 2018-10-10: qty 2

## 2018-10-10 MED ORDER — SENNA 8.6 MG PO TABS
1.0000 | ORAL_TABLET | Freq: Two times a day (BID) | ORAL | Status: DC
  Administered 2018-10-10 – 2018-10-13 (×6): 8.6 mg via ORAL
  Filled 2018-10-10 (×6): qty 1

## 2018-10-10 MED ORDER — ONDANSETRON HCL 4 MG PO TABS: 4.0000 mg | ORAL_TABLET | Freq: Four times a day (QID) | ORAL | Status: DC | PRN

## 2018-10-10 MED ORDER — FENTANYL CITRATE (PF) 100 MCG/2ML IJ SOLN
INTRAMUSCULAR | Status: AC
  Administered 2018-10-10: 50 ug via INTRAVENOUS
  Filled 2018-10-10: qty 2

## 2018-10-10 MED ORDER — BUPIVACAINE IN DEXTROSE 0.75-8.25 % IT SOLN
INTRATHECAL | Status: DC | PRN
  Administered 2018-10-10: 1.8 mL via INTRATHECAL

## 2018-10-10 MED ORDER — CITALOPRAM HYDROBROMIDE 40 MG PO TABS
40.0000 mg | ORAL_TABLET | Freq: Every day | ORAL | Status: DC
  Administered 2018-10-10 – 2018-10-12 (×3): 40 mg via ORAL
  Filled 2018-10-10: qty 2
  Filled 2018-10-10 (×2): qty 1
  Filled 2018-10-10: qty 2
  Filled 2018-10-10: qty 1
  Filled 2018-10-10: qty 2

## 2018-10-10 MED ORDER — OXYCODONE HCL 5 MG/5ML PO SOLN
5.0000 mg | Freq: Once | ORAL | Status: DC | PRN
  Filled 2018-10-10: qty 5

## 2018-10-10 MED ORDER — VITAMIN D (ERGOCALCIFEROL) 1.25 MG (50000 UNIT) PO CAPS
50000.0000 [IU] | ORAL_CAPSULE | ORAL | Status: DC
  Administered 2018-10-12: 50000 [IU] via ORAL
  Filled 2018-10-10: qty 1

## 2018-10-10 MED ORDER — ISOPROPYL ALCOHOL 70 % SOLN
Status: AC
  Filled 2018-10-10: qty 480

## 2018-10-10 MED ORDER — TRANEXAMIC ACID-NACL 1000-0.7 MG/100ML-% IV SOLN
1000.0000 mg | INTRAVENOUS | Status: AC
  Administered 2018-10-10: 1000 mg via INTRAVENOUS
  Filled 2018-10-10: qty 100

## 2018-10-10 MED ORDER — KETOROLAC TROMETHAMINE 30 MG/ML IJ SOLN
INTRAMUSCULAR | Status: DC | PRN
  Administered 2018-10-10: 30 mg

## 2018-10-10 MED ORDER — DOCUSATE SODIUM 100 MG PO CAPS
100.0000 mg | ORAL_CAPSULE | Freq: Two times a day (BID) | ORAL | Status: DC
  Administered 2018-10-10 – 2018-10-13 (×6): 100 mg via ORAL
  Filled 2018-10-10 (×6): qty 1

## 2018-10-10 MED ORDER — METHOCARBAMOL 500 MG IVPB - SIMPLE MED
500.0000 mg | Freq: Four times a day (QID) | INTRAVENOUS | Status: DC | PRN
  Administered 2018-10-10: 500 mg via INTRAVENOUS
  Filled 2018-10-10: qty 50

## 2018-10-10 MED ORDER — ONDANSETRON HCL 4 MG/2ML IJ SOLN: 4.0000 mg | Freq: Once | INTRAMUSCULAR | Status: DC | PRN

## 2018-10-10 MED ORDER — KETOROLAC TROMETHAMINE 15 MG/ML IJ SOLN
7.5000 mg | Freq: Four times a day (QID) | INTRAMUSCULAR | Status: AC
  Administered 2018-10-10 – 2018-10-11 (×4): 7.5 mg via INTRAVENOUS
  Filled 2018-10-10 (×3): qty 1

## 2018-10-10 MED ORDER — ALPRAZOLAM 0.25 MG PO TABS
0.2500 mg | ORAL_TABLET | Freq: Every day | ORAL | Status: DC
  Administered 2018-10-10 – 2018-10-12 (×3): 0.25 mg via ORAL
  Filled 2018-10-10 (×3): qty 1

## 2018-10-10 MED ORDER — MORPHINE SULFATE (PF) 4 MG/ML IV SOLN: 0.5000 mg | INTRAVENOUS | Status: DC | PRN

## 2018-10-10 MED ORDER — SUGAMMADEX SODIUM 200 MG/2ML IV SOLN
INTRAVENOUS | Status: DC | PRN
  Administered 2018-10-10: 180 mg via INTRAVENOUS

## 2018-10-10 MED ORDER — PANTOPRAZOLE SODIUM 40 MG PO TBEC
80.0000 mg | DELAYED_RELEASE_TABLET | Freq: Every day | ORAL | Status: DC
  Administered 2018-10-10 – 2018-10-13 (×4): 80 mg via ORAL
  Filled 2018-10-10 (×4): qty 2

## 2018-10-10 MED ORDER — ROSUVASTATIN CALCIUM 5 MG PO TABS
5.0000 mg | ORAL_TABLET | ORAL | Status: DC
  Administered 2018-10-13: 5 mg via ORAL
  Filled 2018-10-10 (×2): qty 1

## 2018-10-10 MED ORDER — METHOCARBAMOL 500 MG PO TABS
500.0000 mg | ORAL_TABLET | Freq: Four times a day (QID) | ORAL | Status: DC | PRN
  Administered 2018-10-11 – 2018-10-13 (×2): 500 mg via ORAL
  Filled 2018-10-10 (×3): qty 1

## 2018-10-10 MED ORDER — MIDAZOLAM HCL 5 MG/5ML IJ SOLN
INTRAMUSCULAR | Status: DC | PRN
  Administered 2018-10-10 (×2): 1 mg via INTRAVENOUS

## 2018-10-10 MED ORDER — ONDANSETRON HCL 4 MG/2ML IJ SOLN
INTRAMUSCULAR | Status: AC
  Filled 2018-10-10: qty 2

## 2018-10-10 MED ORDER — MOMETASONE FURO-FORMOTEROL FUM 100-5 MCG/ACT IN AERO
2.0000 | INHALATION_SPRAY | Freq: Two times a day (BID) | RESPIRATORY_TRACT | Status: DC
  Administered 2018-10-10 – 2018-10-13 (×6): 2 via RESPIRATORY_TRACT
  Filled 2018-10-10: qty 8.8

## 2018-10-10 MED ORDER — DEXAMETHASONE SODIUM PHOSPHATE 10 MG/ML IJ SOLN
INTRAMUSCULAR | Status: AC
  Filled 2018-10-10: qty 1

## 2018-10-10 MED ORDER — ACETAMINOPHEN 325 MG PO TABS: 325.0000 mg | ORAL_TABLET | Freq: Four times a day (QID) | ORAL | Status: DC | PRN

## 2018-10-10 MED ORDER — OXYCODONE HCL 5 MG PO TABS: 5.0000 mg | ORAL_TABLET | Freq: Once | ORAL | Status: DC | PRN

## 2018-10-10 MED ORDER — TOPIRAMATE 100 MG PO TABS
100.0000 mg | ORAL_TABLET | Freq: Every day | ORAL | Status: DC
  Administered 2018-10-10 – 2018-10-12 (×3): 100 mg via ORAL
  Filled 2018-10-10 (×3): qty 1

## 2018-10-10 MED ORDER — ISOPROPYL ALCOHOL 70 % SOLN
Status: DC | PRN
  Administered 2018-10-10: 1 via TOPICAL

## 2018-10-10 MED ORDER — SODIUM CHLORIDE 0.9 % IJ SOLN
INTRAMUSCULAR | Status: AC
  Filled 2018-10-10: qty 50

## 2018-10-10 MED ORDER — WATER BOTTLE MISC
Status: DC | PRN
  Administered 2018-10-10: 2000 mL

## 2018-10-10 MED ORDER — BUPIVACAINE HCL (PF) 0.25 % IJ SOLN
INTRAMUSCULAR | Status: AC
  Filled 2018-10-10: qty 30

## 2018-10-10 MED ORDER — BUPIVACAINE HCL (PF) 0.25 % IJ SOLN
INTRAMUSCULAR | Status: DC | PRN
  Administered 2018-10-10: 30 mL

## 2018-10-10 MED ORDER — METHOCARBAMOL 500 MG IVPB - SIMPLE MED
INTRAVENOUS | Status: AC
  Administered 2018-10-10: 500 mg via INTRAVENOUS
  Filled 2018-10-10: qty 50

## 2018-10-10 MED ORDER — POVIDONE-IODINE 10 % EX SWAB
2.0000 "application " | Freq: Once | CUTANEOUS | Status: AC
  Administered 2018-10-10: 2 via TOPICAL

## 2018-10-10 MED ORDER — POLYETHYLENE GLYCOL 3350 17 G PO PACK
17.0000 g | PACK | Freq: Every day | ORAL | Status: DC | PRN
  Administered 2018-10-11: 17 g via ORAL
  Filled 2018-10-10: qty 1

## 2018-10-10 MED ORDER — MIDAZOLAM HCL 2 MG/2ML IJ SOLN
INTRAMUSCULAR | Status: AC
  Filled 2018-10-10: qty 2

## 2018-10-10 MED ORDER — ALBUTEROL SULFATE HFA 108 (90 BASE) MCG/ACT IN AERS
INHALATION_SPRAY | RESPIRATORY_TRACT | Status: DC | PRN
  Administered 2018-10-10: 2 via RESPIRATORY_TRACT

## 2018-10-10 MED ORDER — PROPOFOL 500 MG/50ML IV EMUL
INTRAVENOUS | Status: DC | PRN
  Administered 2018-10-10: 50 ug/kg/min via INTRAVENOUS

## 2018-10-10 MED ORDER — DEXAMETHASONE SODIUM PHOSPHATE 10 MG/ML IJ SOLN
INTRAMUSCULAR | Status: DC | PRN
  Administered 2018-10-10: 4 mg via INTRAVENOUS

## 2018-10-10 MED ORDER — CHLORHEXIDINE GLUCONATE 4 % EX LIQD: 60.0000 mL | Freq: Once | CUTANEOUS | Status: DC

## 2018-10-10 SURGICAL SUPPLY — 68 items
ADH SKN CLS APL DERMABOND .7 (GAUZE/BANDAGES/DRESSINGS) ×2
BAG SPEC THK2 15X12 ZIP CLS (MISCELLANEOUS)
BAG ZIPLOCK 12X15 (MISCELLANEOUS) IMPLANT
BANDAGE ACE 4X5 VEL STRL LF (GAUZE/BANDAGES/DRESSINGS) ×2 IMPLANT
BANDAGE ACE 6X5 VEL STRL LF (GAUZE/BANDAGES/DRESSINGS) ×2 IMPLANT
BANDAGE ELASTIC 4 VELCRO ST LF (GAUZE/BANDAGES/DRESSINGS) ×1 IMPLANT
BANDAGE ELASTIC 6 VELCRO ST LF (GAUZE/BANDAGES/DRESSINGS) ×1 IMPLANT
BATTERY INSTRU NAVIGATION (MISCELLANEOUS) ×6 IMPLANT
BEARING TIBIAL SZ4 13MM KNEE (Insert) IMPLANT
BLADE SAW RECIPROCATING 77.5 (BLADE) ×2 IMPLANT
BSPLAT TIB 4 KN TRITANIUM (Knees) ×1 IMPLANT
BTRY SRG DRVR LF (MISCELLANEOUS) ×3
CHLORAPREP W/TINT 26ML (MISCELLANEOUS) ×4 IMPLANT
COVER SURGICAL LIGHT HANDLE (MISCELLANEOUS) ×2 IMPLANT
COVER WAND RF STERILE (DRAPES) IMPLANT
CUFF TOURN SGL QUICK 34 (TOURNIQUET CUFF) ×2
CUFF TRNQT CYL 34X4X40X1 (TOURNIQUET CUFF) ×1 IMPLANT
DECANTER SPIKE VIAL GLASS SM (MISCELLANEOUS) ×4 IMPLANT
DERMABOND ADVANCED (GAUZE/BANDAGES/DRESSINGS) ×2
DERMABOND ADVANCED .7 DNX12 (GAUZE/BANDAGES/DRESSINGS) ×2 IMPLANT
DRAPE SHEET LG 3/4 BI-LAMINATE (DRAPES) ×4 IMPLANT
DRAPE U-SHAPE 47X51 STRL (DRAPES) ×2 IMPLANT
DRESSING AQUACEL AG SP 3.5X10 (GAUZE/BANDAGES/DRESSINGS) IMPLANT
DRSG AQUACEL AG ADV 3.5X10 (GAUZE/BANDAGES/DRESSINGS) ×2 IMPLANT
DRSG AQUACEL AG SP 3.5X10 (GAUZE/BANDAGES/DRESSINGS) ×2
DRSG TEGADERM 4X4.75 (GAUZE/BANDAGES/DRESSINGS) IMPLANT
ELECT BLADE TIP CTD 4 INCH (ELECTRODE) ×2 IMPLANT
ELECT REM PT RETURN 15FT ADLT (MISCELLANEOUS) ×2 IMPLANT
EVACUATOR 1/8 PVC DRAIN (DRAIN) IMPLANT
GAUZE SPONGE 4X4 12PLY STRL (GAUZE/BANDAGES/DRESSINGS) ×2 IMPLANT
GLOVE BIO SURGEON STRL SZ8.5 (GLOVE) ×4 IMPLANT
GLOVE BIOGEL PI IND STRL 8.5 (GLOVE) ×1 IMPLANT
GLOVE BIOGEL PI INDICATOR 8.5 (GLOVE) ×1
GOWN SPEC L3 XXLG W/TWL (GOWN DISPOSABLE) ×2 IMPLANT
HANDPIECE INTERPULSE COAX TIP (DISPOSABLE) ×2
HOOD PEEL AWAY FLYTE STAYCOOL (MISCELLANEOUS) ×6 IMPLANT
KNEE FEMORAL COMP RT RETAIN (Knees) ×1 IMPLANT
KNEE PATELLA ASYMMETRIC 9X29 (Knees) ×1 IMPLANT
KNEE TIBIAL COMP TRI SZ4 (Knees) ×1 IMPLANT
MARKER SKIN DUAL TIP RULER LAB (MISCELLANEOUS) ×2 IMPLANT
NDL SPNL 18GX3.5 QUINCKE PK (NEEDLE) ×1 IMPLANT
NEEDLE SPNL 18GX3.5 QUINCKE PK (NEEDLE) ×2 IMPLANT
NS IRRIG 1000ML POUR BTL (IV SOLUTION) ×2 IMPLANT
PACK TOTAL KNEE CUSTOM (KITS) ×2 IMPLANT
PADDING CAST COTTON 6X4 STRL (CAST SUPPLIES) ×2 IMPLANT
POSITIONER SURGICAL ARM (MISCELLANEOUS) ×2 IMPLANT
SAW OSC TIP CART 19.5X105X1.3 (SAW) ×2 IMPLANT
SEALER BIPOLAR AQUA 6.0 (INSTRUMENTS) ×2 IMPLANT
SET HNDPC FAN SPRY TIP SCT (DISPOSABLE) ×1 IMPLANT
SET PAD KNEE POSITIONER (MISCELLANEOUS) ×2 IMPLANT
SPONGE DRAIN TRACH 4X4 STRL 2S (GAUZE/BANDAGES/DRESSINGS) IMPLANT
SPONGE LAP 18X18 RF (DISPOSABLE) IMPLANT
SUT MNCRL AB 3-0 PS2 18 (SUTURE) ×2 IMPLANT
SUT MON AB 2-0 CT1 36 (SUTURE) ×4 IMPLANT
SUT STRATAFIX PDO 1 14 VIOLET (SUTURE) ×2
SUT STRATFX PDO 1 14 VIOLET (SUTURE) ×1
SUT VIC AB 1 CTX 36 (SUTURE) ×4
SUT VIC AB 1 CTX36XBRD ANBCTR (SUTURE) ×2 IMPLANT
SUT VIC AB 2-0 CT1 27 (SUTURE) ×2
SUT VIC AB 2-0 CT1 TAPERPNT 27 (SUTURE) ×1 IMPLANT
SUTURE STRATFX PDO 1 14 VIOLET (SUTURE) ×1 IMPLANT
SYR 50ML LL SCALE MARK (SYRINGE) ×2 IMPLANT
TIBIAL BEARING SZ4 13MM KNEE (Insert) ×2 IMPLANT
TOWER CARTRIDGE SMART MIX (DISPOSABLE) IMPLANT
TRAY FOLEY MTR SLVR 16FR STAT (SET/KITS/TRAYS/PACK) IMPLANT
WATER STERILE IRR 1000ML POUR (IV SOLUTION) ×4 IMPLANT
WRAP KNEE MAXI GEL POST OP (GAUZE/BANDAGES/DRESSINGS) ×2 IMPLANT
YANKAUER SUCT BULB TIP 10FT TU (MISCELLANEOUS) ×2 IMPLANT

## 2018-10-10 NOTE — Anesthesia Procedure Notes (Signed)
Date/Time: 10/10/2018 7:40 AM Performed by: Niel Hummer, CRNA Oxygen Delivery Method: Simple face mask

## 2018-10-10 NOTE — Op Note (Signed)
OPERATIVE REPORT  SURGEON: Rod Can, MD   ASSISTANT: Nehemiah Massed, PA-C.  PREOPERATIVE DIAGNOSIS: Right knee arthritis.   POSTOPERATIVE DIAGNOSIS: Right knee arthritis.   PROCEDURE: Right total knee arthroplasty.   IMPLANTS: Stryker Triathlon CR femur, size 4. Stryker Tritanium tibia, size 4. X3 polyethelyene insert, size 13 mm, CS. 3 button asymmetric patella, size 29 mm.  ANESTHESIA:  General, Regional and Spinal  TOURNIQUET TIME: Not utilized.   ESTIMATED BLOOD LOSS: 300 mL.   ANTIBIOTICS: 900 mg clindamycin.  DRAINS: None.  COMPLICATIONS: None   CONDITION: PACU - hemodynamically stable.   BRIEF CLINICAL NOTE: Alexis Rios is a 72 y.o. female with a long-standing history of Right knee arthritis. After failing conservative management, the patient was indicated for total knee arthroplasty. The risks, benefits, and alternatives to the procedure were explained, and the patient elected to proceed.  PROCEDURE IN DETAIL: Adductor canal block was obtained in the pre-op holding area. Once inside the operative room, spinal anesthesia was obtained, and a foley catheter was inserted. The patient was then positioned, a nonsterile tourniquet was placed, and the lower extremity was prepped and draped in the normal sterile surgical fashion. A time-out was called verifying side and site of surgery. The patient received IV antibiotics within 60 minutes of beginning the procedure. The tourniquet was not utilized.  An anterior approach to the knee was performed utilizing a midvastus arthrotomy. A medial release was performed and the patellar fat pad was excised. Stryker navigation was used to cut the distal femur perpendicular to the mechanical axis. A freehand patellar resection was performed, and the patella was sized an prepared with 3 lug holes.  Nagivation was used to make a  neutral proximal tibia resection, taking 6 mm of bone from the less affected medial side with 3 degrees of slope. The menisci were excised. A spacer block was placed, and the alignment and balance in extension were confirmed.   The distal femur was sized using the 3-degree external rotation guide referencing the posterior femoral cortex. The appropriate 4-in-1 cutting block was pinned into place. Rotation was checked using Whiteside's line, the epicondylar axis, and then confirmed with a spacer block in flexion. The remaining femoral cuts were performed, taking care to protect the MCL.  The tibia was sized and the trial tray was pinned into place. The remaining trail components were inserted. The knee was stable to varus and valgus stress through a full range of motion. The patella tracked centrally, and the PCL was well balanced. The trial components were removed, and the proximal tibial surface was prepared. Final components were impacted into place. The knee was tested for a final time and found to be well balanced.  The wound was copiously irrigated with normal saline with pulse lavage. Marcaine solution was injected into the periarticular soft tissue. The wound was closed in layers using #1 Vicryl and Stratafix for the fascia, 2-0 Vicryl for the subcutaneous fat, 2-0 Monocryl for the deep dermal layer, 3-0 running Monocryl subcuticular Stitch, and Dermabond for the skin. Once the glue was fully dried, an Aquacell Ag and compressive dressing were applied. Tthe patient was transported to the recovery room in stable condition. Sponge, needle, and instrument counts were correct at the end of the case x2. The patient tolerated the procedure well and there were no known complications.  Please note that a surgical assistant was a medical necessity for this procedure in order to perform it in a safe and expeditious manner. Surgical assistant was  necessary to retract the ligaments and vital neurovascular  structures to prevent injury to them and also necessary for proper positioning of the limb to allow for anatomic placement of the prosthesis.

## 2018-10-10 NOTE — Anesthesia Procedure Notes (Signed)
Spinal  Patient location during procedure: OR Staffing Anesthesiologist: Lidia Collum, MD Performed: anesthesiologist  Preanesthetic Checklist Completed: patient identified, surgical consent, pre-op evaluation, timeout performed, IV checked, risks and benefits discussed and monitors and equipment checked Spinal Block Patient position: sitting Prep: site prepped and draped and DuraPrep Patient monitoring: continuous pulse ox, blood pressure and heart rate Approach: midline Location: L3-4 Injection technique: single-shot Needle Needle type: Quincke  Needle gauge: 22 G Needle length: 9 cm Additional Notes Functioning IV was confirmed and monitors were applied. Sterile prep and drape, including hand hygiene and sterile gloves were used. The patient was positioned and the spine was prepped. The skin was anesthetized with lidocaine.  Free flow of clear CSF was obtained prior to injecting local anesthetic into the CSF. The needle was carefully withdrawn. The patient tolerated the procedure well.

## 2018-10-10 NOTE — Anesthesia Procedure Notes (Signed)
Procedure Name: Intubation Date/Time: 10/10/2018 8:14 AM Performed by: Niel Hummer, CRNA Pre-anesthesia Checklist: Patient identified, Suction available, Emergency Drugs available and Patient being monitored Patient Re-evaluated:Patient Re-evaluated prior to induction Oxygen Delivery Method: Circle system utilized Preoxygenation: Pre-oxygenation with 100% oxygen Induction Type: IV induction Ventilation: Mask ventilation without difficulty Laryngoscope Size: Mac and 3 Grade View: Grade I Tube type: Oral Laser Tube: Cuffed inflated with minimal occlusive pressure - saline Tube size: 7.0 mm Number of attempts: 1 Airway Equipment and Method: Stylet Placement Confirmation: ETT inserted through vocal cords under direct vision,  positive ETCO2 and breath sounds checked- equal and bilateral Secured at: 20 cm Tube secured with: Tape

## 2018-10-10 NOTE — Anesthesia Procedure Notes (Addendum)
   Anesthesia Regional Block: Adductor canal block   Pre-Anesthetic Checklist: ,, timeout performed, Correct Patient, Correct Site, Correct Laterality, Correct Procedure, Correct Position, site marked, Risks and benefits discussed,  Surgical consent,  Pre-op evaluation,  At surgeon's request and post-op pain management  Laterality: Right  Prep: chloraprep       Needles:  Injection technique: Single-shot  Needle Type: Echogenic Stimulator Needle     Needle Length: 10cm  Needle Gauge: 21     Additional Needles:   Procedures:,,,, ultrasound used (permanent image in chart),,,,  Narrative:  Start time: 10/10/2018 7:15 AM End time: 10/10/2018 7:21 AM Injection made incrementally with aspirations every 5 mL.  Performed by: Personally  Anesthesiologist: Lidia Collum, MD  Additional Notes: Monitors applied. Injection made in 5cc increments. No resistance to injection. Good needle visualization. Patient tolerated procedure well.

## 2018-10-10 NOTE — Interval H&P Note (Signed)
History and Physical Interval Note:  10/10/2018 7:30 AM  Alexis Rios  has presented today for surgery, with the diagnosis of Degenerative joint disease right knee, valgus  The various methods of treatment have been discussed with the patient and family. After consideration of risks, benefits and other options for treatment, the patient has consented to  Procedure(s) with comments: RIGHT TOTAL KNEE ARTHROPLASTY WITH COMPUTER NAVIGATION (Right) - Needs RNFA as a surgical intervention .  The patient's history has been reviewed, patient examined, no change in status, stable for surgery.  I have reviewed the patient's chart and labs.  Questions were answered to the patient's satisfaction.     Hilton Cork Quana Chamberlain

## 2018-10-10 NOTE — Evaluation (Signed)
Physical Therapy Evaluation Patient Details Name: Alexis Rios MRN: [id number] DOB: 1946/10/18 Today's Date: 10/10/2018   History of Present Illness  72 yo female s/p R TKR on 10/10/18. PMH includes CAD, GERD, obesity, shoulder disorder, OSA, anxiety, carpal tunnel, depression, DM II, HTN, UTI. Surgical history includes back surgery, cholecystectomy, R knee and foot surgery, R toe amputation.  Clinical Impression   Pt presents with R knee pain, decreased time and effort to perform mobility, and decreased tolerance for ambulation. Pt to benefit from acute PT to address deficits. Pt ambulated 50 ft with RW with min guard assist. After ambulation, pt with mild nausea, BP 99/60 when taken by NT. Pt educated on quad sets (5-10/hour), ankle pumps (20/hour), and heel slides (5-10/hour) to perform this evening to lessen stiffness, to pt's tolerance and limited by pain. PT to progress mobility as tolerated, and will continue to follow acutely.      Follow Up Recommendations Follow surgeon's recommendation for DC plan and follow-up therapies;Supervision for mobility/OOB(OPPT)    Equipment Recommendations  None recommended by PT    Recommendations for Other Services       Precautions / Restrictions Precautions Precautions: Fall Restrictions Weight Bearing Restrictions: No Other Position/Activity Restrictions: WBAT       Mobility  Bed Mobility Overal bed mobility: Needs Assistance Bed Mobility: Supine to Sit     Supine to sit: Min guard;HOB elevated     General bed mobility comments: Pt very independent and did not want physical assist. Increased time and effort. Min guard for safety.   Transfers Overall transfer level: Needs assistance Equipment used: Rolling walker (2 wheeled) Transfers: Sit to/from Stand Sit to Stand: Min guard;From elevated surface         General transfer comment: Min guard for safety, verbal cuing for hand placement. Pt again requesting no physical  assist.   Ambulation/Gait Ambulation/Gait assistance: Min guard Gait Distance (Feet): 50 Feet Assistive device: Rolling walker (2 wheeled) Gait Pattern/deviations: Step-to pattern;Decreased stride length;Antalgic;Trunk flexed Gait velocity: decr    General Gait Details: Min guard for safety. Verbal cuing for sequencing, placement in RW, turning.   Stairs            Wheelchair Mobility    Modified Rankin (Stroke Patients Only)       Balance Overall balance assessment: Mild deficits observed, not formally tested                                           Pertinent Vitals/Pain Pain Assessment: 0-10 Pain Score: 6  Pain Location: R knee  Pain Descriptors / Indicators: Sore Pain Intervention(s): Limited activity within patient's tolerance;Repositioned;Ice applied;Monitored during session    [place] expects to be discharged to:: Private residence Living Arrangements: Spouse/significant other Available Help at Discharge: Family;Available 24 hours/day(pt's spouse to assist with cooking/cleaning, pt's son who lives 2 miles away available to help PRN) Type of Home: House Home Access: Stairs to enter Entrance Stairs-Rails: None Entrance Stairs-Number of Steps: 1 Home Layout: One level Home Equipment: Environmental consultant - 2 wheels;Cane - single point;Bedside commode;Shower seat      Prior Function Level of Independence: Independent with assistive device(s)         Comments: used cane and RW intermittently, if knee was particularly painful      Hand Dominance   Dominant Hand: Right    Extremity/Trunk Assessment   Upper  Extremity Assessment Upper Extremity Assessment: Overall WFL for tasks assessed    Lower Extremity Assessment Lower Extremity Assessment: Overall WFL for tasks assessed;RLE deficits/detail RLE Deficits / Details: suspected post-surgical weakness; able to perform ankle pumps, quad sets, SLR with min assist  RLE Sensation:  WNL    Cervical / Trunk Assessment Cervical / Trunk Assessment: Normal  Communication   Communication: No difficulties  Cognition Arousal/Alertness: Awake/alert Behavior During Therapy: WFL for tasks assessed/performed Overall Cognitive Status: Within Functional Limits for tasks assessed                                        General Comments      Exercises Total Joint Exercises Ankle Circles/Pumps: AROM;Both;10 reps;Seated Goniometric ROM: Knee AAROM approximately 5-80*, limited by pain    Assessment/Plan    PT Assessment Patient needs continued PT services  PT Problem List Decreased strength;Pain;Decreased range of motion;Decreased activity tolerance;Decreased knowledge of use of DME;Decreased balance;Decreased mobility       PT Treatment Interventions DME instruction;Therapeutic activities;Gait training;Therapeutic exercise;Patient/family education;Stair training;Balance training;Functional mobility training    PT Goals (Current goals can be found in the Care Plan section)  Acute Rehab PT Goals Patient Stated Goal: none stated  PT Goal Formulation: With patient Time For Goal Achievement: 10/17/18 Potential to Achieve Goals: Good    Frequency 7X/week   Barriers to discharge        Co-evaluation               AM-PAC PT "6 Clicks" Daily Activity  Outcome Measure Difficulty turning over in bed (including adjusting bedclothes, sheets and blankets)?: Unable Difficulty moving from lying on back to sitting on the side of the bed? : Unable Difficulty sitting down on and standing up from a chair with arms (e.g., wheelchair, bedside commode, etc,.)?: Unable Help needed moving to and from a bed to chair (including a wheelchair)?: None Help needed walking in hospital room?: A Little Help needed climbing 3-5 steps with a railing? : A Little 6 Click Score: 13    End of Session Equipment Utilized During Treatment: Gait belt Activity Tolerance: Patient  tolerated treatment well Patient left: in chair;with chair alarm set;with call bell/phone within reach;with family/visitor present;with SCD's reapplied Nurse Communication: Mobility status PT Visit Diagnosis: Other abnormalities of gait and mobility (R26.89);Difficulty in walking, not elsewhere classified (R26.2)    Time: 1510-1535 PT Time Calculation (min) (ACUTE ONLY): 25 min   Charges:   PT Evaluation $PT Eval Low Complexity: 1 Low PT Treatments $Gait Training: 8-22 mins        Julien Girt, PT Acute Rehabilitation Services Pager [phone]  Office [phone]   Thelton Graca D Elonda Husky 10/10/2018, 5:52 PM

## 2018-10-10 NOTE — Discharge Instructions (Signed)
° °Dr. Talaysha Freeberg °Total Joint Specialist °[facility] °[address] °[place], [place] [address] °[phone] ° °TOTAL KNEE REPLACEMENT POSTOPERATIVE DIRECTIONS ° ° ° °Knee Rehabilitation, Guidelines Following Surgery  °Results after knee surgery are often greatly improved when you follow the exercise, range of motion and muscle strengthening exercises prescribed by your doctor. Safety measures are also important to protect the knee from further injury. Any time any of these exercises cause you to have increased pain or swelling in your knee joint, decrease the amount until you are comfortable again and slowly increase them. If you have problems or questions, call your caregiver or physical therapist for advice.  ° °WEIGHT BEARING °Weight bearing as tolerated with assist device (walker, cane, etc) as directed, use it as long as suggested by your surgeon or therapist, typically at least 4-6 weeks. ° °HOME CARE INSTRUCTIONS  °Remove items at home which could result in a fall. This includes throw rugs or furniture in walking pathways.  °Continue medications as instructed at time of discharge. °You may have some home medications which will be placed on hold until you complete the course of blood thinner medication.  °You may start showering once you are discharged home but do not submerge the incision under water. Just pat the incision dry and apply a dry gauze dressing on daily. °Walk with walker as instructed.  °You may resume a sexual relationship in one month or when given the OK by your doctor.  °· Use walker as long as suggested by your caregivers. °· Avoid periods of inactivity such as sitting longer than an hour when not asleep. This helps prevent blood clots.  °You may put full weight on your legs and walk as much as is comfortable.  °You may return to work once you are cleared by your doctor.  °Do not drive a car for 6 weeks or until released by you surgeon.  °· Do not drive  while taking narcotics.  °Wear the elastic stockings for three weeks following surgery during the day but you may remove then at night. °Make sure you keep all of your appointments after your operation with all of your doctors and caregivers. You should call the office at the above phone number and make an appointment for approximately two weeks after the date of your surgery. °Do not remove your surgical dressing. The dressing is waterproof; you may take showers in 3 days, but do not take tub baths or submerge the dressing. °Please pick up a stool softener and laxative for home use as long as you are requiring pain medications. °· ICE to the affected knee every three hours for 30 minutes at a time and then as needed for pain and swelling.  Continue to use ice on the knee for pain and swelling from surgery. You may notice swelling that will progress down to the foot and ankle.  This is normal after surgery.  Elevate the leg when you are not up walking on it.   °It is important for you to complete the blood thinner medication as prescribed by your doctor. °· Continue to use the breathing machine which will help keep your temperature down.  It is common for your temperature to cycle up and down following surgery, especially at night when you are not up moving around and exerting yourself.  The breathing machine keeps your lungs expanded and your temperature down. ° °RANGE OF MOTION AND STRENGTHENING EXERCISES  °Rehabilitation of the knee is important following   a knee injury or an operation. After just a few days of immobilization, the muscles of the thigh which control the knee become weakened and shrink (atrophy). Knee exercises are designed to build up the tone and strength of the thigh muscles and to improve knee motion. Often times heat used for twenty to thirty minutes before working out will loosen up your tissues and help with improving the range of motion but do not use heat for the first two weeks following  surgery. These exercises can be done on a training (exercise) mat, on the floor, on a table or on a bed. Use what ever works the best and is most comfortable for you Knee exercises include:  °Leg Lifts - While your knee is still immobilized in a splint or cast, you can do straight leg raises. Lift the leg to 60 degrees, hold for 3 sec, and slowly lower the leg. Repeat 10-20 times 2-3 times daily. Perform this exercise against resistance later as your knee gets better.  °Quad and Hamstring Sets - Tighten up the muscle on the front of the thigh (Quad) and hold for 5-10 sec. Repeat this 10-20 times hourly. Hamstring sets are done by pushing the foot backward against an object and holding for 5-10 sec. Repeat as with quad sets.  °A rehabilitation program following serious knee injuries can speed recovery and prevent re-injury in the future due to weakened muscles. Contact your doctor or a physical therapist for more information on knee rehabilitation.  ° °SKILLED REHAB INSTRUCTIONS: °If the patient is transferred to a skilled rehab facility following release from the hospital, a list of the current medications will be sent to the facility for the patient to continue.  When discharged from the skilled rehab facility, please have the facility set up the patient's Home Health Physical Therapy prior to being released. Also, the skilled facility will be responsible for providing the patient with their medications at time of release from the facility to include their pain medication, the muscle relaxants, and their blood thinner medication. If the patient is still at the rehab facility at time of the two week follow up appointment, the skilled rehab facility will also need to assist the patient in arranging follow up appointment in our office and any transportation needs. ° °MAKE SURE YOU:  °Understand these instructions.  °Will watch your condition.  °Will get help right away if you are not doing well or get worse.   ° ° °Pick up stool softner and laxative for home use following surgery while on pain medications. °Do NOT remove your dressing. You may shower.  °Do not take tub baths or submerge incision under water. °May shower starting three days after surgery. °Please use a clean towel to pat the incision dry following showers. °Continue to use ice for pain and swelling after surgery. °Do not use any lotions or creams on the incision until instructed by your surgeon. ° °

## 2018-10-10 NOTE — Anesthesia Procedure Notes (Signed)
Date/Time: 10/10/2018 7:12 AM Performed by: Sharlette Dense, CRNA Oxygen Delivery Method: Nasal cannula

## 2018-10-10 NOTE — Transfer of Care (Signed)
Immediate Anesthesia Transfer of Care Note  Patient: Alexis Rios  Procedure(s) Performed: RIGHT TOTAL KNEE ARTHROPLASTY WITH COMPUTER NAVIGATION (Right Knee)  Patient Location: PACU  Anesthesia Type:General  Level of Consciousness: awake and oriented  Airway & Oxygen Therapy: Patient Spontanous Breathing and Patient connected to face mask oxygen  Post-op Assessment: Report given to RN and Post -op Vital signs reviewed and stable  Post vital signs: Reviewed and stable  Last Vitals:  Vitals Value Taken Time  BP 135/70 10/10/2018 10:31 AM  Temp    Pulse 79 10/10/2018 10:33 AM  Resp 19 10/10/2018 10:33 AM  SpO2 100 % 10/10/2018 10:33 AM  Vitals shown include unvalidated device data.  Last Pain:  Vitals:   10/10/18 0604  TempSrc:   PainSc: 8       Patients Stated Pain Goal: 4 (66/81/59 [id number])  Complications: No apparent anesthesia complications

## 2018-10-10 NOTE — Anesthesia Postprocedure Evaluation (Signed)
Anesthesia Post Note  Patient: Alexis Rios  Procedure(s) Performed: RIGHT TOTAL KNEE ARTHROPLASTY WITH COMPUTER NAVIGATION (Right Knee)     Patient location during evaluation: PACU Anesthesia Type: General Level of consciousness: awake and alert Pain management: pain level controlled Vital Signs Assessment: post-procedure vital signs reviewed and stable Respiratory status: spontaneous breathing, nonlabored ventilation, respiratory function stable and patient connected to nasal cannula oxygen Cardiovascular status: blood pressure returned to baseline and stable Postop Assessment: no apparent nausea or vomiting, spinal receding, no headache and no backache Anesthetic complications: no Comments: Failed spinal- converted to GA    Last Vitals:  Vitals:   10/10/18 1115 10/10/18 1130  BP:  (!) 115/98  Pulse: 78 78  Resp: 19 17  Temp: 36.4 C   SpO2: 97% 99%    Last Pain:  Vitals:   10/10/18 1145  TempSrc:   PainSc: 3                  Lidia Collum

## 2018-10-11 LAB — CBC
HEMATOCRIT: 29.5 % — AB (ref 36.0–46.0)
Hemoglobin: 8.8 g/dL — ABNORMAL LOW (ref 12.0–15.0)
MCH: 29.5 pg (ref 26.0–34.0)
MCHC: 29.8 g/dL — AB (ref 30.0–36.0)
MCV: 99 fL (ref 80.0–100.0)
Platelets: 174 10*3/uL (ref 150–400)
RBC: 2.98 MIL/uL — ABNORMAL LOW (ref 3.87–5.11)
RDW: 14.6 % (ref 11.5–15.5)
WBC: 8.5 10*3/uL (ref 4.0–10.5)
nRBC: 0 % (ref 0.0–0.2)

## 2018-10-11 LAB — BASIC METABOLIC PANEL
ANION GAP: 5 (ref 5–15)
BUN: 12 mg/dL (ref 8–23)
CO2: 25 mmol/L (ref 22–32)
Calcium: 7.7 mg/dL — ABNORMAL LOW (ref 8.9–10.3)
Chloride: 108 mmol/L (ref 98–111)
Creatinine, Ser: 0.84 mg/dL (ref 0.44–1.00)
GFR calc non Af Amer: >60 mL/min (ref >60)
GLUCOSE: 132 mg/dL — AB (ref 70–99)
Potassium: 4.6 mmol/L (ref 3.5–5.1)
Sodium: 138 mmol/L (ref 135–145)

## 2018-10-11 MED ORDER — ONDANSETRON HCL 4 MG PO TABS: 4.0000 mg | ORAL_TABLET | Freq: Four times a day (QID) | ORAL | 0 refills | Status: DC | PRN

## 2018-10-11 MED ORDER — HYDROCODONE-ACETAMINOPHEN 5-325 MG PO TABS: 1.0000 | ORAL_TABLET | Freq: Four times a day (QID) | ORAL | 0 refills | Status: DC | PRN

## 2018-10-11 MED ORDER — SODIUM CHLORIDE 0.9 % IV BOLUS
1000.0000 mL | Freq: Once | INTRAVENOUS | Status: AC
  Administered 2018-10-11: 1000 mL via INTRAVENOUS

## 2018-10-11 MED ORDER — DOCUSATE SODIUM 100 MG PO CAPS: 100.0000 mg | ORAL_CAPSULE | Freq: Two times a day (BID) | ORAL | 1 refills | Status: DC

## 2018-10-11 MED ORDER — ASPIRIN 81 MG PO CHEW: 81.0000 mg | CHEWABLE_TABLET | Freq: Two times a day (BID) | ORAL | 1 refills | Status: DC

## 2018-10-11 MED ORDER — SENNA 8.6 MG PO TABS: 1.0000 | ORAL_TABLET | Freq: Two times a day (BID) | ORAL | 0 refills | Status: DC

## 2018-10-11 NOTE — Discharge Summary (Signed)
Physician Discharge Summary  Patient ID: Alexis Rios MRN: [id number] DOB/AGE: 1946/04/29 72 y.o.  Admit date: 10/10/2018 Discharge date: 10/13/2018  Admission Diagnoses:  Osteoarthritis of right knee  Discharge Diagnoses:  Principal Problem:   Osteoarthritis of right knee   Past Medical History:  Diagnosis Date  . Anemia   . Anxiety   . Arthritis   . Asthma   . CAD (coronary artery disease)    Cath 2009, AV groove CX 30%, RCA 20 %  . Candidiasis of skin and nail   . Carpal tunnel syndrome   . Chronic back pain   . Chronic headaches    migraines  . Complication of anesthesia    if no breathing tx before anesthesia wakes with asthma  . COPD (chronic obstructive pulmonary disease) ([facility])   . Depression   . Diabetes mellitus    DIET CONTROLLED NOW  . Diverticulosis   . Gastroparesis    ?  Alexis Rios Kitchen GERD (gastroesophageal reflux disease)   . H/O echocardiogram    Echo 09/2011: Mild LVH, EF 30-07%, grade 1 diastolic dysfunction, trivial AI, mild MR, mild LAE.  . H/O exercise stress test    Dobutamine Myoview 09/2011: No scar or ischemia, no EKG changes, study not gated.  . HH (hiatus hernia)   . History of gallstones   . History of pneumonia   . Hypercholesteremia   . Hypertension   . IBS (irritable bowel syndrome)   . Osteoporosis   . Peripheral neuropathy    LEGS AND FEET  . Pneumonia LAST TIME 8 YRS AGO  . S/P cardiac catheterization    LHC 03/2008: Mid AV groove circumflex 30%, mid RCA 20%, EF 60%  . Seasonal allergies   . Skin cancer    scc/bcc  . Skin yeast infection 07/05/2015  . Sleep apnea    cpap   . Stones in the urinary tract   . Subclinical hypothyroidism   . UTI (urinary tract infection)     Surgeries: Procedure(s): RIGHT TOTAL KNEE ARTHROPLASTY WITH COMPUTER NAVIGATION on 10/10/2018   Consultants (if any):   Discharged Condition: Improved  Hospital Course: Alexis Rios is an 72 y.o. female who was admitted 10/10/2018 with a diagnosis of  Osteoarthritis of right knee and went to the operating room on 10/10/2018 and underwent the above named procedures.    She was given perioperative antibiotics:  Anti-infectives (From admission, onward)   Start     Dose/Rate Route Frequency Ordered Stop   10/10/18 1400  clindamycin (CLEOCIN) IVPB 600 mg     600 mg 100 mL/hr over 30 Minutes Intravenous Every 6 hours 10/10/18 1154 10/10/18 2016   10/10/18 0600  clindamycin (CLEOCIN) IVPB 900 mg     900 mg 100 mL/hr over 30 Minutes Intravenous On call to O.R. 10/10/18 6226 10/10/18 0750    .  She was given sequential compression devices, early ambulation, and ASA for DVT prophylaxis.  She benefited maximally from the hospital stay and there were no complications.    Recent vital signs:  Vitals:   10/13/18 0459 10/13/18 0825  BP: (!) 119/48   Pulse: 86   Resp: 16   Temp: 98.5 F (36.9 C)   SpO2: 98% 98%    Recent laboratory studies:  Lab Results  Component Value Date   HGB 7.1 (L) 10/13/2018   HGB 7.5 (L) 10/12/2018   HGB 8.8 (L) 10/11/2018   Lab Results  Component Value Date   WBC 8.7 10/13/2018   PLT  155 10/13/2018   Lab Results  Component Value Date   INR 0.9 04/06/2008   Lab Results  Component Value Date   NA 138 10/11/2018   K 4.6 10/11/2018   CL 108 10/11/2018   CO2 25 10/11/2018   BUN 12 10/11/2018   CREATININE 0.84 10/11/2018   GLUCOSE 132 (H) 10/11/2018    Discharge Medications:   Allergies as of 10/13/2018      Reactions   Sulfa Antibiotics Swelling   Mouth and throat   Codeine Itching   TAKES OK WITH BENADRYL   Cymbalta [duloxetine Hcl] Other (See Comments)   Cannot urinate   Keflex [cephalexin]    Macrodantin [nitrofurantoin Macrocrystal] Nausea And Vomiting   Mucinex [guaifenesin Er] Other (See Comments)   Urinary retention   Tape Rash      Medication List    STOP taking these medications   diclofenac sodium 1 % Gel Commonly known as:  VOLTAREN   lisinopril 5 MG tablet Commonly  known as:  PRINIVIL,ZESTRIL   TYLENOL 8 HOUR PO     TAKE these medications   albuterol 108 (90 Base) MCG/ACT inhaler Commonly known as:  PROVENTIL HFA;VENTOLIN HFA Inhale 2 puffs into the lungs every 4 (four) hours as needed for wheezing or shortness of breath. What changed:  additional instructions   albuterol (2.5 MG/3ML) 0.083% nebulizer solution Commonly known as:  PROVENTIL Take 2.5 mg by nebulization every 6 (six) hours as needed for shortness of breath. What changed:  Another medication with the same name was changed. Make sure you understand how and when to take each.   ALPRAZolam 0.25 MG tablet Commonly known as:  XANAX Take 0.25 mg by mouth at bedtime.   aspirin 81 MG chewable tablet Chew 1 tablet (81 mg total) by mouth 2 (two) times daily.   citalopram 40 MG tablet Commonly known as:  CELEXA Take 40 mg by mouth at bedtime.   cyanocobalamin 1000 MCG/ML injection Commonly known as:  (VITAMIN B-12) Inject 1,000 mcg into the muscle every 30 (thirty) days.   docusate sodium 100 MG capsule Commonly known as:  COLACE Take 1 capsule (100 mg total) by mouth 2 (two) times daily.   gabapentin 400 MG capsule Commonly known as:  NEURONTIN Take 400 mg by mouth at bedtime.   HYDROcodone-acetaminophen 5-325 MG tablet Commonly known as:  NORCO/VICODIN Take 1-2 tablets by mouth every 6 (six) hours as needed for moderate pain. What changed:    how much to take  reasons to take this   mirabegron ER 25 MG Tb24 tablet Commonly known as:  MYRBETRIQ Take 25 mg by mouth at bedtime.   mometasone-formoterol 200-5 MCG/ACT Aero Commonly known as:  DULERA Inhale 2 puffs into the lungs 2 (two) times daily.   omeprazole 40 MG capsule Commonly known as:  PRILOSEC Take one capsule every morning as directed. What changed:    how much to take  how to take this  when to take this  reasons to take this   ondansetron 4 MG tablet Commonly known as:  ZOFRAN Take 1 tablet (4  mg total) by mouth every 6 (six) hours as needed for nausea.   ranitidine 300 MG tablet Commonly known as:  ZANTAC Take 1 tablet (300 mg total) by mouth at bedtime.   rosuvastatin 5 MG tablet Commonly known as:  CRESTOR Take 5 mg by mouth every Sunday.   senna 8.6 MG Tabs tablet Commonly known as:  SENOKOT Take 1 tablet (8.6 mg  total) by mouth 2 (two) times daily.   sucralfate 1 g tablet Commonly known as:  CARAFATE Take 1 tablet (1 g total) by mouth 4 (four) times daily.   SYMBICORT 80-4.5 MCG/ACT inhaler Generic drug:  budesonide-formoterol Inhale 2 puffs into the lungs at bedtime.   topiramate 100 MG tablet Commonly known as:  TOPAMAX Take 100 mg by mouth at bedtime.   Vitamin D (Ergocalciferol) 50000 units Caps capsule Commonly known as:  DRISDOL Take 50,000 Units by mouth every Saturday.       Diagnostic Studies: Dg Knee Right Port  Result Date: 10/10/2018 CLINICAL DATA:  Status post total right knee replacement. EXAM: PORTABLE RIGHT KNEE - 1-2 VIEW COMPARISON:  None. FINDINGS: Patient is status post right knee replacement. Hardware is in good position. Postoperative air seen in the soft tissues. IMPRESSION: Right knee replacement as above. Electronically Signed   By: Dorise Bullion III M.D   On: 10/10/2018 12:46    Disposition:     Follow-up Information    Amore Ackman, Aaron Edelman, MD. Schedule an appointment as soon as possible for a visit in 2 weeks.   Specialty:  Orthopedic Surgery Why:  For wound re-check Contact information: [address] [place] [address] [phone]            Signed: Hilton Cork Alayia Meggison 10/14/2018, 4:54 PM

## 2018-10-11 NOTE — Progress Notes (Signed)
Physical Therapy Treatment Patient Details Name: Alexis Rios MRN: [id number] DOB: 1946/10/05 Today's Date: 10/11/2018    History of Present Illness 72 yo female s/p R TKR on 10/10/18. PMH includes CAD, GERD, obesity, shoulder disorder, OSA, anxiety, carpal tunnel, depression, DM II, HTN, UTI. Surgical history includes back surgery, cholecystectomy, R knee and foot surgery, R toe amputation.    PT Comments    Pt is very motivated and is progressing well with mobility, she ambulated 60' with RW, no loss of balance. Instructed pt in TKA HEP.  Good progress expected.   Follow Up Recommendations  Follow surgeon's recommendation for DC plan and follow-up therapies;Supervision for mobility/OOB(OPPT)     Equipment Recommendations  None recommended by PT    Recommendations for Other Services       Precautions / Restrictions Precautions Precautions: Fall Restrictions Weight Bearing Restrictions: No Other Position/Activity Restrictions: WBAT     Mobility  Bed Mobility Overal bed mobility: Modified Independent Bed Mobility: Supine to Sit     Supine to sit: HOB elevated;Modified independent (Device/Increase time)     General bed mobility comments: no physical assist needed  Transfers Overall transfer level: Needs assistance Equipment used: Rolling walker (2 wheeled) Transfers: Sit to/from Stand Sit to Stand: Min guard;From elevated surface         General transfer comment: VCs hand placement  Ambulation/Gait Ambulation/Gait assistance: Min guard Gait Distance (Feet): 90 Feet Assistive device: Rolling walker (2 wheeled) Gait Pattern/deviations: Step-to pattern;Decreased stride length;Antalgic Gait velocity: decr    General Gait Details: Min guard for safety. Verbal cuing for sequencing, placement in RW, turning. No loss of balance   Stairs             Wheelchair Mobility    Modified Rankin (Stroke Patients Only)       Balance Overall balance  assessment: Mild deficits observed, not formally tested                                          Cognition Arousal/Alertness: Awake/alert Behavior During Therapy: WFL for tasks assessed/performed Overall Cognitive Status: Within Functional Limits for tasks assessed                                        Exercises Total Joint Exercises Ankle Circles/Pumps: AROM;Both;10 reps;Seated Quad Sets: AROM;Both;10 reps;Supine Short Arc Quad: AROM;Right;10 reps;Supine Heel Slides: AAROM;Right;10 reps;Supine Hip ABduction/ADduction: AROM;Right;10 reps;Supine Straight Leg Raises: AROM;Right;10 reps;Supine Long Arc Quad: AROM;Right;10 reps;Seated Goniometric ROM: 5-65* AAROM R knee    General Comments        Pertinent Vitals/Pain Pain Score: 5  Pain Location: R knee  Pain Descriptors / Indicators: Sore Pain Intervention(s): Limited activity within patient's tolerance;Monitored during session;Premedicated before session;Ice applied    Home Living                      Prior Function            PT Goals (current goals can now be found in the care plan section) Acute Rehab PT Goals Patient Stated Goal: none stated  PT Goal Formulation: With patient Time For Goal Achievement: 10/17/18 Potential to Achieve Goals: Good Progress towards PT goals: Progressing toward goals    Frequency    7X/week  PT Plan Current plan remains appropriate    Co-evaluation              AM-PAC PT "6 Clicks" Daily Activity  Outcome Measure  Difficulty turning over in bed (including adjusting bedclothes, sheets and blankets)?: A Little Difficulty moving from lying on back to sitting on the side of the bed? : A Little Difficulty sitting down on and standing up from a chair with arms (e.g., wheelchair, bedside commode, etc,.)?: A Little Help needed moving to and from a bed to chair (including a wheelchair)?: A Little Help needed walking in hospital  room?: A Little Help needed climbing 3-5 steps with a railing? : A Little 6 Click Score: 18    End of Session Equipment Utilized During Treatment: Gait belt Activity Tolerance: Patient tolerated treatment well Patient left: in chair;with chair alarm set;with call bell/phone within reach Nurse Communication: Mobility status PT Visit Diagnosis: Other abnormalities of gait and mobility (R26.89);Difficulty in walking, not elsewhere classified (R26.2)     Time: 0931-1216 PT Time Calculation (min) (ACUTE ONLY): 24 min  Charges:  $Gait Training: 8-22 mins $Therapeutic Exercise: 8-22 mins                     Blondell Reveal Kistler PT 10/11/2018  Acute Rehabilitation Services Pager [phone] Office [phone]

## 2018-10-11 NOTE — Care Management Note (Signed)
Case Management Note  Patient Details  Name: BURNETTA KOHLS MRN: [id number] Date of Birth: 01-01-1946  Subjective/Objective:        Spoke with patient at bedside. Confirmed plan for OP PT, already arranged. Has RW and 3n1. [phone]            Action/Plan:   Expected Discharge Date:                  Expected Discharge Plan:  [place]  In-House Referral:  NA  Discharge planning Services  CM Consult  Post Acute Care Choice:  Home Health Choice offered to:  Patient  DME Arranged:  N/A DME Agency:  NA  HH Arranged:  NA HH Agency:  NA  Status of Service:  Completed, signed off  If discussed at [place] of Stay Meetings, dates discussed:    Additional Comments:  Guadalupe Maple, RN 10/11/2018, 11:57 AM

## 2018-10-11 NOTE — Progress Notes (Signed)
    Subjective:  Patient reports pain as mild to moderate.  Denies N/V/CP/SOB. Hypotn early this am - responded to IVFs.  Objective:   VITALS:   Vitals:   10/11/18 0900 10/11/18 0944 10/11/18 1126 10/11/18 1129  BP: (!) 87/52 (!) 90/50 (!) 90/48 (!) 100/52  Pulse: 82     Resp: 20     Temp: 98.5 F (36.9 C)     TempSrc: Oral     SpO2: 97%     Weight:      Height:        NAD ABD soft Sensation intact distally Intact pulses distally Dorsiflexion/Plantar flexion intact Incision: dressing C/D/I Compartment soft   Lab Results  Component Value Date   WBC 8.5 10/11/2018   HGB 8.8 (L) 10/11/2018   HCT 29.5 (L) 10/11/2018   MCV 99.0 10/11/2018   PLT 174 10/11/2018   BMET    Component Value Date/Time   NA 138 10/11/2018 0531   K 4.6 10/11/2018 0531   CL 108 10/11/2018 0531   CO2 25 10/11/2018 0531   GLUCOSE 132 (H) 10/11/2018 0531   BUN 12 10/11/2018 0531   CREATININE 0.84 10/11/2018 0531   CALCIUM 7.7 (L) 10/11/2018 0531   GFRNONAA >60 10/11/2018 0531   GFRAA >60 10/11/2018 0531     Assessment/Plan: 1 Day Post-Op   Principal Problem:   Osteoarthritis of right knee   WBAT with walker DVT ppx: Aspirin, SCDs, TEDS PO pain control PT/OT Dispo: D.C home tomorrow with outpatient PT   Alexis Rios Tomekia Helton 10/11/2018, 1:05 PM   Rod Can, MD Cell: [phone] [place] is now [facility]  [place] [address], [address], [place], [place] [address] Phone: [phone] [email or web address] Facebook  [employer]

## 2018-10-11 NOTE — Progress Notes (Signed)
Physical Therapy Treatment Patient Details Name: Alexis Rios MRN: [id number] DOB: 11-Sep-1946 Today's Date: 10/11/2018    History of Present Illness 72 yo female s/p R TKR on 10/10/18. PMH includes CAD, GERD, obesity, shoulder disorder, OSA, anxiety, carpal tunnel, depression, DM II, HTN, UTI. Surgical history includes back surgery, cholecystectomy, R knee and foot surgery, R toe amputation.    PT Comments    Pt is progressing well, she tolerated increased ambulation distance of 120' with RW, no loss of balance. Performed TKA  HEP with min assist. Will plan to do stair training tomorrow morning.   Follow Up Recommendations  Follow surgeon's recommendation for DC plan and follow-up therapies;Supervision for mobility/OOB(OPPT)     Equipment Recommendations  None recommended by PT    Recommendations for Other Services       Precautions / Restrictions Precautions Precautions: Fall;Knee Precaution Booklet Issued: Yes (comment) Precaution Comments: reviewed no pillow under knee Restrictions Weight Bearing Restrictions: No Other Position/Activity Restrictions: WBAT     Mobility  Bed Mobility Overal bed mobility: Modified Independent Bed Mobility: Supine to Sit     Supine to sit: HOB elevated;Modified independent (Device/Increase time)     General bed mobility comments: no physical assist needed  Transfers Overall transfer level: Needs assistance Equipment used: Rolling walker (2 wheeled) Transfers: Sit to/from Stand Sit to Stand: Min guard;From elevated surface         General transfer comment: VCs hand placement  Ambulation/Gait Ambulation/Gait assistance: Min guard Gait Distance (Feet): 120 Feet Assistive device: Rolling walker (2 wheeled) Gait Pattern/deviations: Step-to pattern;Decreased stride length;Antalgic Gait velocity: decr    General Gait Details: Min guard for safety. Verbal cuing for sequencing, placement in RW, turning. No loss of  balance   Stairs             Wheelchair Mobility    Modified Rankin (Stroke Patients Only)       Balance Overall balance assessment: Mild deficits observed, not formally tested                                          Cognition Arousal/Alertness: Awake/alert Behavior During Therapy: WFL for tasks assessed/performed Overall Cognitive Status: Within Functional Limits for tasks assessed                                        Exercises Total Joint Exercises Ankle Circles/Pumps: AROM;Both;10 reps;Seated Quad Sets: AROM;Both;10 reps;Supine Short Arc Quad: AROM;Right;10 reps;Supine Heel Slides: AAROM;Right;10 reps;Supine Hip ABduction/ADduction: AROM;Right;10 reps;Supine Straight Leg Raises: AROM;Right;10 reps;Supine Long Arc Quad: AROM;Right;10 reps;Seated Knee Flexion: AROM;AAROM;Right;10 reps;Seated Goniometric ROM: 5-80* AAROM R knee    General Comments        Pertinent Vitals/Pain Pain Score: 8  Pain Location: R knee  Pain Descriptors / Indicators: Sore Pain Intervention(s): Limited activity within patient's tolerance;Monitored during session;Premedicated before session;Ice applied    Home Living                      Prior Function            PT Goals (current goals can now be found in the care plan section) Acute Rehab PT Goals Patient Stated Goal: to clean her house PT Goal Formulation: With patient Time For Goal Achievement: 10/17/18 Potential to Achieve Goals:  Good Progress towards PT goals: Progressing toward goals    Frequency    7X/week      PT Plan Current plan remains appropriate    Co-evaluation              AM-PAC PT "6 Clicks" Daily Activity  Outcome Measure  Difficulty turning over in bed (including adjusting bedclothes, sheets and blankets)?: A Little Difficulty moving from lying on back to sitting on the side of the bed? : A Little Difficulty sitting down on and standing up  from a chair with arms (e.g., wheelchair, bedside commode, etc,.)?: A Little Help needed moving to and from a bed to chair (including a wheelchair)?: A Little Help needed walking in hospital room?: A Little Help needed climbing 3-5 steps with a railing? : A Little 6 Click Score: 18    End of Session Equipment Utilized During Treatment: Gait belt Activity Tolerance: Patient tolerated treatment well Patient left: in chair;with chair alarm set;with call bell/phone within reach Nurse Communication: Mobility status PT Visit Diagnosis: Other abnormalities of gait and mobility (R26.89);Difficulty in walking, not elsewhere classified (R26.2)     Time: 0300-9233 PT Time Calculation (min) (ACUTE ONLY): 26 min  Charges:  $Gait Training: 8-22 mins $Therapeutic Exercise: 8-22 mins                     Alexis Rios PT 10/11/2018  Acute Rehabilitation Services Pager [phone] Office [phone]

## 2018-10-12 LAB — CBC
HCT: 24.7 % — ABNORMAL LOW (ref 36.0–46.0)
Hemoglobin: 7.5 g/dL — ABNORMAL LOW (ref 12.0–15.0)
MCH: 30 pg (ref 26.0–34.0)
MCHC: 30.4 g/dL (ref 30.0–36.0)
MCV: 98.8 fL (ref 80.0–100.0)
Platelets: 146 10*3/uL — ABNORMAL LOW (ref 150–400)
RBC: 2.5 MIL/uL — ABNORMAL LOW (ref 3.87–5.11)
RDW: 14.7 % (ref 11.5–15.5)
WBC: 7.9 10*3/uL (ref 4.0–10.5)
nRBC: 0 % (ref 0.0–0.2)

## 2018-10-12 NOTE — Progress Notes (Signed)
Physical Therapy Treatment Patient Details Name: Alexis Rios MRN: [id number] DOB: 25-Jun-1946 Today's Date: 10/12/2018    History of Present Illness 72 yo female s/p R TKR on 10/10/18. PMH includes CAD, GERD, obesity, shoulder disorder, OSA, anxiety, carpal tunnel, depression, DM II, HTN, UTI. Surgical history includes back surgery, cholecystectomy, R knee and foot surgery, R toe amputation.    PT Comments    Pt ambulated 120' with RW, distance limited by fatigue.   Follow Up Recommendations  Follow surgeon's recommendation for DC plan and follow-up therapies;Supervision for mobility/OOB(OPPT)     Equipment Recommendations  None recommended by PT    Recommendations for Other Services       Precautions / Restrictions Precautions Precautions: Fall;Knee Precaution Booklet Issued: Yes (comment) Precaution Comments: reviewed no pillow under knee Restrictions Weight Bearing Restrictions: No Other Position/Activity Restrictions: WBAT     Mobility  Bed Mobility Overal bed mobility: Modified Independent Bed Mobility: Supine to Sit     Supine to sit: HOB elevated;Modified independent (Device/Increase time)     General bed mobility comments: no physical assist needed, increased time  Transfers Overall transfer level: Needs assistance Equipment used: Rolling walker (2 wheeled) Transfers: Sit to/from Stand Sit to Stand: Min guard;From elevated surface         General transfer comment: VCs hand placement, attempted with bed in low position and pt unable to rise unassisted, from elevated bed she was able to rise, pt reports she has lift chair and an elevating bed at home  Ambulation/Gait Ambulation/Gait assistance: Min guard Gait Distance (Feet): 120 Feet Assistive device: Rolling walker (2 wheeled) Gait Pattern/deviations: Step-to pattern;Decreased stride length;Antalgic Gait velocity: decr    General Gait Details: Min guard for safety. Verbal cuing for sequencing,  placement in RW, turning. No loss of balance   Stairs             Wheelchair Mobility    Modified Rankin (Stroke Patients Only)       Balance                                            Cognition Arousal/Alertness: Awake/alert Behavior During Therapy: WFL for tasks assessed/performed Overall Cognitive Status: Within Functional Limits for tasks assessed                                        Exercises      General Comments        Pertinent Vitals/Pain Pain Score: 6  Pain Location: R knee  Pain Descriptors / Indicators: Sore;Aching Pain Intervention(s): Limited activity within patient's tolerance;Monitored during session;Ice applied;Patient requesting pain meds-RN notified    Home Living                      Prior Function            PT Goals (current goals can now be found in the care plan section) Acute Rehab PT Goals Patient Stated Goal: to clean her house PT Goal Formulation: With patient Time For Goal Achievement: 10/17/18 Potential to Achieve Goals: Good Progress towards PT goals: Progressing toward goals    Frequency    7X/week      PT Plan Current plan remains appropriate    Co-evaluation  AM-PAC PT "6 Clicks" Daily Activity  Outcome Measure  Difficulty turning over in bed (including adjusting bedclothes, sheets and blankets)?: A Little Difficulty moving from lying on back to sitting on the side of the bed? : A Little Difficulty sitting down on and standing up from a chair with arms (e.g., wheelchair, bedside commode, etc,.)?: A Little Help needed moving to and from a bed to chair (including a wheelchair)?: A Little Help needed walking in hospital room?: A Little Help needed climbing 3-5 steps with a railing? : A Little 6 Click Score: 18    End of Session Equipment Utilized During Treatment: Gait belt Activity Tolerance: Patient tolerated treatment well Patient left:  with call bell/phone within reach;in bed Nurse Communication: Mobility status PT Visit Diagnosis: Other abnormalities of gait and mobility (R26.89);Difficulty in walking, not elsewhere classified (R26.2)     Time: 8295-6213 PT Time Calculation (min) (ACUTE ONLY): 27 min  Charges:  $Gait Training: 8-22 mins $Therapeutic Exercise: 8-22 mins                    Blondell Reveal Kistler PT 10/12/2018  Acute Rehabilitation Services Pager [phone] Office [phone]

## 2018-10-12 NOTE — Progress Notes (Signed)
    Subjective:  Patient reports pain as mild to moderate.  Denies N/V/CP/SOB. No c/o.  Objective:   VITALS:   Vitals:   10/11/18 1129 10/11/18 1446 10/11/18 2239 10/12/18 0606  BP: (!) 100/52 (!) 99/44 (!) 117/48 (!) 101/43  Pulse:  83 95 67  Resp:  16 16 16   Temp:  97.6 F (36.4 C) 97.8 F (36.6 C) 98.2 F (36.8 C)  TempSrc:  Oral Oral Oral  SpO2:  94% 98% 95%  Weight:      Height:        NAD ABD soft Sensation intact distally Intact pulses distally Dorsiflexion/Plantar flexion intact Incision: dressing C/D/I Compartment soft   Lab Results  Component Value Date   WBC 7.9 10/12/2018   HGB 7.5 (L) 10/12/2018   HCT 24.7 (L) 10/12/2018   MCV 98.8 10/12/2018   PLT 146 (L) 10/12/2018   BMET    Component Value Date/Time   NA 138 10/11/2018 0531   K 4.6 10/11/2018 0531   CL 108 10/11/2018 0531   CO2 25 10/11/2018 0531   GLUCOSE 132 (H) 10/11/2018 0531   BUN 12 10/11/2018 0531   CREATININE 0.84 10/11/2018 0531   CALCIUM 7.7 (L) 10/11/2018 0531   GFRNONAA >60 10/11/2018 0531   GFRAA >60 10/11/2018 0531     Assessment/Plan: 2 Days Post-Op   Principal Problem:   Osteoarthritis of right knee   WBAT with walker DVT ppx: Aspirin, SCDs, TEDS PO pain control PT/OT ABLA: hgb 7.5 today, asymptomatic, transfuse for hgb < 7 Dispo: recheck hgb in am, D.C home tomorrow with outpatient PT   Hilton Cork Gyasi Hazzard 10/12/2018, 9:21 AM   Rod Can, MD Cell: [phone] [place] is now [facility]  [place] [address], [address], [place], [place] [address] Phone: [phone] [email or web address] Facebook  [employer]

## 2018-10-12 NOTE — Progress Notes (Signed)
Physical Therapy Treatment Patient Details Name: Alexis Rios MRN: [id number] DOB: [birth year] Today's Date: 10/12/2018    History of Present Illness 72 yo female s/p R TKR on 10/10/18. PMH includes CAD, GERD, obesity, shoulder disorder, OSA, anxiety, carpal tunnel, depression, DM II, HTN, UTI. Surgical history includes back surgery, cholecystectomy, R knee and foot surgery, R toe amputation.    PT Comments    Pt is progressing well with mobility, she ambulated 180' with RW and performed TKA exercises with supervision. Pt reports she has no stairs to enter her home, stair training deferred. Pt had difficulty with sit to stand earlier this morning with nursing. Pt was able to perform sit to stand from an elevated surface during PT session, pt stated her bed at home can elevate, and she has a lift chair. Instructed pt to place her bedside commode over her toilet at home to elevate surface. She stated her husband had surgery last week and cannot provide much physical assist, but that her son and granddaughter live a mile away and are available 24/7 if needed. From PT standpoint, pt is ready to DC home.    Follow Up Recommendations  Follow surgeon's recommendation for DC plan and follow-up therapies;Supervision for mobility/OOB(OPPT)     Equipment Recommendations  None recommended by PT    Recommendations for Other Services       Precautions / Restrictions Precautions Precautions: Fall;Knee Precaution Booklet Issued: Yes (comment) Precaution Comments: reviewed no pillow under knee Restrictions Weight Bearing Restrictions: No Other Position/Activity Restrictions: WBAT     Mobility  Bed Mobility Overal bed mobility: Modified Independent Bed Mobility: Supine to Sit     Supine to sit: HOB elevated;Modified independent (Device/Increase time)     General bed mobility comments: no physical assist needed, increased time  Transfers Overall transfer level: Needs assistance Equipment  used: Rolling walker (2 wheeled) Transfers: Sit to/from Stand Sit to Stand: Min guard;From elevated surface         General transfer comment: VCs hand placement, attempted with bed in low position and pt unable to rise unassisted, from elevated bed she was able to rise, pt reports she has lift chair and an elevating bed at home  Ambulation/Gait Ambulation/Gait assistance: Min guard Gait Distance (Feet): 180 Feet Assistive device: Rolling walker (2 wheeled) Gait Pattern/deviations: Step-to pattern;Decreased stride length;Antalgic Gait velocity: decr    General Gait Details: Min guard for safety. Verbal cuing for sequencing, placement in RW, turning. No loss of balance   Stairs             Wheelchair Mobility    Modified Rankin (Stroke Patients Only)       Balance Overall balance assessment: Mild deficits observed, not formally tested                                          Cognition Arousal/Alertness: Awake/alert Behavior During Therapy: WFL for tasks assessed/performed Overall Cognitive Status: Within Functional Limits for tasks assessed                                        Exercises Total Joint Exercises Ankle Circles/Pumps: AROM;Both;10 reps;Seated Quad Sets: AROM;Both;10 reps;Supine Short Arc Quad: AROM;Right;10 reps;Supine Heel Slides: AAROM;Right;10 reps;Supine Hip ABduction/ADduction: AROM;Right;10 reps;Supine Straight Leg Raises: AROM;Right;10 reps;Supine Long Arc Quad: AROM;Right;10  reps;Seated Knee Flexion: AROM;AAROM;Right;10 reps;Seated Goniometric ROM: 5-80* AAROM R knee    General Comments        Pertinent Vitals/Pain Pain Score: 5  Pain Location: R knee  Pain Descriptors / Indicators: Sore Pain Intervention(s): Limited activity within patient's tolerance;Monitored during session;Premedicated before session;Ice applied    Home Living                      Prior Function            PT  Goals (current goals can now be found in the care plan section) Acute Rehab PT Goals Patient Stated Goal: to clean her house PT Goal Formulation: With patient Time For Goal Achievement: 10/17/18 Potential to Achieve Goals: Good Progress towards PT goals: Progressing toward goals    Frequency    7X/week      PT Plan Current plan remains appropriate    Co-evaluation              AM-PAC PT "6 Clicks" Daily Activity  Outcome Measure  Difficulty turning over in bed (including adjusting bedclothes, sheets and blankets)?: A Little Difficulty moving from lying on back to sitting on the side of the bed? : A Little Difficulty sitting down on and standing up from a chair with arms (e.g., wheelchair, bedside commode, etc,.)?: A Little Help needed moving to and from a bed to chair (including a wheelchair)?: A Little Help needed walking in hospital room?: A Little Help needed climbing 3-5 steps with a railing? : A Little 6 Click Score: 18    End of Session Equipment Utilized During Treatment: Gait belt Activity Tolerance: Patient tolerated treatment well Patient left: with call bell/phone within reach;in bed;with bed alarm set Nurse Communication: Mobility status PT Visit Diagnosis: Other abnormalities of gait and mobility (R26.89);Difficulty in walking, not elsewhere classified (R26.2)     Time: 2831-5176 PT Time Calculation (min) (ACUTE ONLY): 53 min  Charges:  $Gait Training: 23-37 mins $Therapeutic Exercise: 8-22 mins $Therapeutic Activity: 8-22 mins                     Blondell Reveal Kistler PT 10/12/2018  Acute Rehabilitation Services Pager [phone] Office [phone]

## 2018-10-13 LAB — CBC
HCT: 23.8 % — ABNORMAL LOW (ref 36.0–46.0)
Hemoglobin: 7.1 g/dL — ABNORMAL LOW (ref 12.0–15.0)
MCH: 29.1 pg (ref 26.0–34.0)
MCHC: 29.8 g/dL — AB (ref 30.0–36.0)
MCV: 97.5 fL (ref 80.0–100.0)
Platelets: 155 10*3/uL (ref 150–400)
RBC: 2.44 MIL/uL — ABNORMAL LOW (ref 3.87–5.11)
RDW: 14.7 % (ref 11.5–15.5)
WBC: 8.7 10*3/uL (ref 4.0–10.5)
nRBC: 0.2 % (ref 0.0–0.2)

## 2018-10-13 NOTE — Progress Notes (Signed)
Physical Therapy Treatment Patient Details Name: Alexis Rios MRN: [id number] DOB: 1946-07-22 Today's Date: 10/13/2018    History of Present Illness 72 yo female s/p R TKR on 10/10/18. PMH includes CAD, GERD, obesity, shoulder disorder, OSA, anxiety, carpal tunnel, depression, DM II, HTN, UTI. Surgical history includes back surgery, cholecystectomy, R knee and foot surgery, R toe amputation.    PT Comments    Pt cooperative and progressing well with mobility.  Eager for [employer] home.   Follow Up Recommendations  Follow surgeon's recommendation for DC plan and follow-up therapies;Supervision for mobility/OOB     Equipment Recommendations  None recommended by PT    Recommendations for Other Services       Precautions / Restrictions Precautions Precautions: Fall;Knee Restrictions Weight Bearing Restrictions: No Other Position/Activity Restrictions: WBAT     Mobility  Bed Mobility               General bed mobility comments: OOB with nursing  Transfers Overall transfer level: Needs assistance Equipment used: Rolling walker (2 wheeled) Transfers: Sit to/from Stand Sit to Stand: Supervision         General transfer comment: min cues for LE management and use of UEs to self assist  Ambulation/Gait Ambulation/Gait assistance: Min guard;Supervision Gait Distance (Feet): 200 Feet Assistive device: Rolling walker (2 wheeled) Gait Pattern/deviations: Decreased step length - right;Decreased step length - left;Shuffle;Step-to pattern;Step-through pattern;Trunk flexed Gait velocity: decr    General Gait Details: cues for posture, position from RW and intial sequence   Stairs             Wheelchair Mobility    Modified Rankin (Stroke Patients Only)       Balance Overall balance assessment: Mild deficits observed, not formally tested                                          Cognition Arousal/Alertness: Awake/alert Behavior During  Therapy: WFL for tasks assessed/performed Overall Cognitive Status: Within Functional Limits for tasks assessed                                        Exercises Total Joint Exercises Ankle Circles/Pumps: AROM;Both;Seated;20 reps Quad Sets: AROM;Both;Supine;15 reps Heel Slides: AAROM;Right;Supine;20 reps Straight Leg Raises: AROM;Right;Supine;20 reps Long Arc Quad: AROM;Right;10 reps;Seated    General Comments        Pertinent Vitals/Pain Pain Assessment: 0-10 Pain Score: 4  Pain Location: R knee  Pain Descriptors / Indicators: Sore;Aching Pain Intervention(s): Limited activity within patient's tolerance;Monitored during session;Premedicated before session;Ice applied    Home Living                      Prior Function            PT Goals (current goals can now be found in the care plan section) Acute Rehab PT Goals Patient Stated Goal: to clean her house PT Goal Formulation: With patient Time For Goal Achievement: 10/17/18 Potential to Achieve Goals: Good Progress towards PT goals: Progressing toward goals    Frequency    7X/week      PT Plan Current plan remains appropriate    Co-evaluation              AM-PAC PT "6 Clicks" Daily Activity  Outcome Measure  Difficulty turning over in bed (including adjusting bedclothes, sheets and blankets)?: A Little Difficulty moving from lying on back to sitting on the side of the bed? : A Little Difficulty sitting down on and standing up from a chair with arms (e.g., wheelchair, bedside commode, etc,.)?: A Little Help needed moving to and from a bed to chair (including a wheelchair)?: A Little Help needed walking in hospital room?: A Little Help needed climbing 3-5 steps with a railing? : A Little 6 Click Score: 18    End of Session Equipment Utilized During Treatment: Gait belt Activity Tolerance: Patient tolerated treatment well Patient left: with call bell/phone within reach;in  bed Nurse Communication: Mobility status PT Visit Diagnosis: Other abnormalities of gait and mobility (R26.89);Difficulty in walking, not elsewhere classified (R26.2)     Time: 1561-5379 PT Time Calculation (min) (ACUTE ONLY): 35 min  Charges:  $Gait Training: 8-22 mins $Therapeutic Exercise: 8-22 mins                     [place] Pager [phone] Office [phone]    Anglea Gordner 10/13/2018, 1:39 PM

## 2018-10-13 NOTE — Progress Notes (Signed)
Pt d/c home with family. No questions at time of d/c. Family understanding on plan of care. No needs or s/s of distress at time of d/c. Pt has home equipment.

## 2018-10-13 NOTE — Progress Notes (Signed)
     Subjective: 3 Days Post-Op Procedure(s) (LRB): RIGHT TOTAL KNEE ARTHROPLASTY WITH COMPUTER NAVIGATION (Right)   Patient reports pain as mild, pain controlled. No events throughout the night. We discussed her H&H levels, but she denies any symptoms.  Discussed that she will have people at home, but that we encourage her to do as much as she can for herself.  Discussed the use of iron to help restore her H&H.  She feels that she is ready to go home.  Plan on discharged home today if she continues to do well with PT.     Objective:   VITALS:   Vitals:   10/13/18 0459 10/13/18 0825  BP: (!) 119/48   Pulse: 86   Resp: 16   Temp: 98.5 F (36.9 C)   SpO2: 98% 98%    Dorsiflexion/Plantar flexion intact Incision: dressing C/D/I No cellulitis present Compartment soft  LABS Recent Labs    10/11/18 0531 10/12/18 0348 10/13/18 0452  HGB 8.8* 7.5* 7.1*  HCT 29.5* 24.7* 23.8*  WBC 8.5 7.9 8.7  PLT 174 146* 155    Recent Labs    10/11/18 0531  NA 138  K 4.6  BUN 12  CREATININE 0.84  GLUCOSE 132*     Assessment/Plan: 3 Days Post-Op Procedure(s) (LRB): RIGHT TOTAL KNEE ARTHROPLASTY WITH COMPUTER NAVIGATION (Right) Up with therapy Discharge home Follow up in 2 weeks at [facility] ([place] Follow up with Dr. Lyla Glassing in 2 weeks.  Contact information:  [facility]) [address], [address] [place]. [place]   PAC  10/13/2018, 8:40 AM

## 2018-10-13 NOTE — Plan of Care (Signed)
Pt to d/c home with family today. RN medicating pt for pain.

## 2018-10-14 ENCOUNTER — Encounter (HOSPITAL_COMMUNITY): Payer: Self-pay | Admitting: Orthopedic Surgery

## 2018-10-15 DIAGNOSIS — Z471 Aftercare following joint replacement surgery: Secondary | ICD-10-CM | POA: Diagnosis not present

## 2018-10-15 DIAGNOSIS — M6281 Muscle weakness (generalized): Secondary | ICD-10-CM | POA: Diagnosis not present

## 2018-10-15 DIAGNOSIS — R2689 Other abnormalities of gait and mobility: Secondary | ICD-10-CM | POA: Diagnosis not present

## 2018-10-15 DIAGNOSIS — Z96651 Presence of right artificial knee joint: Secondary | ICD-10-CM | POA: Diagnosis not present

## 2018-10-15 DIAGNOSIS — M1711 Unilateral primary osteoarthritis, right knee: Secondary | ICD-10-CM | POA: Diagnosis not present

## 2018-10-17 DIAGNOSIS — M1711 Unilateral primary osteoarthritis, right knee: Secondary | ICD-10-CM | POA: Diagnosis not present

## 2018-10-17 DIAGNOSIS — M6281 Muscle weakness (generalized): Secondary | ICD-10-CM | POA: Diagnosis not present

## 2018-10-17 DIAGNOSIS — R2689 Other abnormalities of gait and mobility: Secondary | ICD-10-CM | POA: Diagnosis not present

## 2018-10-17 DIAGNOSIS — Z471 Aftercare following joint replacement surgery: Secondary | ICD-10-CM | POA: Diagnosis not present

## 2018-10-17 DIAGNOSIS — Z96651 Presence of right artificial knee joint: Secondary | ICD-10-CM | POA: Diagnosis not present

## 2018-10-21 DIAGNOSIS — Z888 Allergy status to other drugs, medicaments and biological substances status: Secondary | ICD-10-CM

## 2018-10-21 DIAGNOSIS — Z96651 Presence of right artificial knee joint: Secondary | ICD-10-CM | POA: Diagnosis present

## 2018-10-21 DIAGNOSIS — J449 Chronic obstructive pulmonary disease, unspecified: Secondary | ICD-10-CM | POA: Diagnosis not present

## 2018-10-21 DIAGNOSIS — G8929 Other chronic pain: Secondary | ICD-10-CM | POA: Diagnosis present

## 2018-10-21 DIAGNOSIS — M1990 Unspecified osteoarthritis, unspecified site: Secondary | ICD-10-CM | POA: Diagnosis present

## 2018-10-21 DIAGNOSIS — K5790 Diverticulosis of intestine, part unspecified, without perforation or abscess without bleeding: Secondary | ICD-10-CM | POA: Diagnosis not present

## 2018-10-21 DIAGNOSIS — D62 Acute posthemorrhagic anemia: Secondary | ICD-10-CM | POA: Diagnosis not present

## 2018-10-21 DIAGNOSIS — K219 Gastro-esophageal reflux disease without esophagitis: Secondary | ICD-10-CM | POA: Diagnosis present

## 2018-10-21 DIAGNOSIS — M6281 Muscle weakness (generalized): Secondary | ICD-10-CM | POA: Diagnosis not present

## 2018-10-21 DIAGNOSIS — E1143 Type 2 diabetes mellitus with diabetic autonomic (poly)neuropathy: Secondary | ICD-10-CM | POA: Diagnosis present

## 2018-10-21 DIAGNOSIS — G4730 Sleep apnea, unspecified: Secondary | ICD-10-CM | POA: Diagnosis present

## 2018-10-21 DIAGNOSIS — Z806 Family history of leukemia: Secondary | ICD-10-CM

## 2018-10-21 DIAGNOSIS — I10 Essential (primary) hypertension: Secondary | ICD-10-CM | POA: Diagnosis present

## 2018-10-21 DIAGNOSIS — Z85828 Personal history of other malignant neoplasm of skin: Secondary | ICD-10-CM

## 2018-10-21 DIAGNOSIS — Z833 Family history of diabetes mellitus: Secondary | ICD-10-CM

## 2018-10-21 DIAGNOSIS — Z91048 Other nonmedicinal substance allergy status: Secondary | ICD-10-CM

## 2018-10-21 DIAGNOSIS — I2510 Atherosclerotic heart disease of native coronary artery without angina pectoris: Secondary | ICD-10-CM | POA: Diagnosis present

## 2018-10-21 DIAGNOSIS — K3184 Gastroparesis: Secondary | ICD-10-CM | POA: Diagnosis not present

## 2018-10-21 DIAGNOSIS — Z800 Family history of malignant neoplasm of digestive organs: Secondary | ICD-10-CM

## 2018-10-21 DIAGNOSIS — S72401A Unspecified fracture of lower end of right femur, initial encounter for closed fracture: Principal | ICD-10-CM | POA: Diagnosis present

## 2018-10-21 DIAGNOSIS — M978XXA Periprosthetic fracture around other internal prosthetic joint, initial encounter: Secondary | ICD-10-CM | POA: Diagnosis not present

## 2018-10-21 DIAGNOSIS — E039 Hypothyroidism, unspecified: Secondary | ICD-10-CM | POA: Diagnosis present

## 2018-10-21 DIAGNOSIS — M25561 Pain in right knee: Secondary | ICD-10-CM | POA: Diagnosis not present

## 2018-10-21 DIAGNOSIS — M9711XA 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, initial encounter: Secondary | ICD-10-CM | POA: Diagnosis present

## 2018-10-21 DIAGNOSIS — R2689 Other abnormalities of gait and mobility: Secondary | ICD-10-CM | POA: Diagnosis not present

## 2018-10-21 DIAGNOSIS — Z823 Family history of stroke: Secondary | ICD-10-CM

## 2018-10-21 DIAGNOSIS — G43909 Migraine, unspecified, not intractable, without status migrainosus: Secondary | ICD-10-CM | POA: Diagnosis not present

## 2018-10-21 DIAGNOSIS — Z7951 Long term (current) use of inhaled steroids: Secondary | ICD-10-CM

## 2018-10-21 DIAGNOSIS — Z96649 Presence of unspecified artificial hip joint: Secondary | ICD-10-CM | POA: Diagnosis not present

## 2018-10-21 DIAGNOSIS — M810 Age-related osteoporosis without current pathological fracture: Secondary | ICD-10-CM | POA: Diagnosis present

## 2018-10-21 DIAGNOSIS — Z881 Allergy status to other antibiotic agents status: Secondary | ICD-10-CM

## 2018-10-21 DIAGNOSIS — W19XXXA Unspecified fall, initial encounter: Secondary | ICD-10-CM | POA: Diagnosis present

## 2018-10-21 DIAGNOSIS — E1140 Type 2 diabetes mellitus with diabetic neuropathy, unspecified: Secondary | ICD-10-CM | POA: Diagnosis present

## 2018-10-21 DIAGNOSIS — Z825 Family history of asthma and other chronic lower respiratory diseases: Secondary | ICD-10-CM

## 2018-10-21 DIAGNOSIS — Z961 Presence of intraocular lens: Secondary | ICD-10-CM | POA: Diagnosis present

## 2018-10-21 DIAGNOSIS — Z7982 Long term (current) use of aspirin: Secondary | ICD-10-CM

## 2018-10-21 DIAGNOSIS — Z882 Allergy status to sulfonamides status: Secondary | ICD-10-CM

## 2018-10-21 DIAGNOSIS — M1711 Unilateral primary osteoarthritis, right knee: Secondary | ICD-10-CM | POA: Diagnosis not present

## 2018-10-21 DIAGNOSIS — Z9842 Cataract extraction status, left eye: Secondary | ICD-10-CM | POA: Diagnosis not present

## 2018-10-21 DIAGNOSIS — Y92012 Bathroom of single-family (private) house as the place of occurrence of the external cause: Secondary | ICD-10-CM

## 2018-10-21 DIAGNOSIS — Z885 Allergy status to narcotic agent status: Secondary | ICD-10-CM

## 2018-10-21 DIAGNOSIS — E7800 Pure hypercholesterolemia, unspecified: Secondary | ICD-10-CM | POA: Diagnosis not present

## 2018-10-21 DIAGNOSIS — K449 Diaphragmatic hernia without obstruction or gangrene: Secondary | ICD-10-CM | POA: Diagnosis present

## 2018-10-21 DIAGNOSIS — Z9049 Acquired absence of other specified parts of digestive tract: Secondary | ICD-10-CM

## 2018-10-21 DIAGNOSIS — Z8249 Family history of ischemic heart disease and other diseases of the circulatory system: Secondary | ICD-10-CM

## 2018-10-21 DIAGNOSIS — Z9841 Cataract extraction status, right eye: Secondary | ICD-10-CM

## 2018-10-21 DIAGNOSIS — E876 Hypokalemia: Secondary | ICD-10-CM | POA: Diagnosis present

## 2018-10-21 DIAGNOSIS — Z90710 Acquired absence of both cervix and uterus: Secondary | ICD-10-CM

## 2018-10-21 DIAGNOSIS — M549 Dorsalgia, unspecified: Secondary | ICD-10-CM | POA: Diagnosis present

## 2018-10-21 DIAGNOSIS — S72491A Other fracture of lower end of right femur, initial encounter for closed fracture: Secondary | ICD-10-CM | POA: Diagnosis not present

## 2018-10-21 DIAGNOSIS — Z471 Aftercare following joint replacement surgery: Secondary | ICD-10-CM | POA: Diagnosis not present

## 2018-10-21 NOTE — ED Triage Notes (Signed)
Pt arriving POV with right knee pain. Pt had knee replacement on 10/10/18, was at physical therapy today. Pt reports when she was leaving therapy she began having pain in the right knee and as the day progressed, she has had increased difficulty putting weight on the right leg.

## 2018-10-22 ENCOUNTER — Inpatient Hospital Stay (HOSPITAL_COMMUNITY)
Admission: EM | Admit: 2018-10-22 | Discharge: 2018-10-26 | DRG: 481 | Disposition: A | Discharge status: Skilled Nursing Facility | Payer: Medicare Other | Attending: Orthopedic Surgery | Admitting: Orthopedic Surgery

## 2018-10-22 ENCOUNTER — Other Ambulatory Visit: Payer: Self-pay

## 2018-10-22 ENCOUNTER — Emergency Department (HOSPITAL_COMMUNITY): Payer: Medicare Other

## 2018-10-22 ENCOUNTER — Encounter (HOSPITAL_COMMUNITY): Payer: Self-pay | Admitting: Emergency Medicine

## 2018-10-22 DIAGNOSIS — Z96651 Presence of right artificial knee joint: Secondary | ICD-10-CM | POA: Diagnosis not present

## 2018-10-22 DIAGNOSIS — R262 Difficulty in walking, not elsewhere classified: Secondary | ICD-10-CM | POA: Diagnosis not present

## 2018-10-22 DIAGNOSIS — N3281 Overactive bladder: Secondary | ICD-10-CM | POA: Diagnosis not present

## 2018-10-22 DIAGNOSIS — E1143 Type 2 diabetes mellitus with diabetic autonomic (poly)neuropathy: Secondary | ICD-10-CM | POA: Diagnosis present

## 2018-10-22 DIAGNOSIS — J449 Chronic obstructive pulmonary disease, unspecified: Secondary | ICD-10-CM | POA: Diagnosis not present

## 2018-10-22 DIAGNOSIS — M9711XD 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, subsequent encounter: Secondary | ICD-10-CM | POA: Diagnosis not present

## 2018-10-22 DIAGNOSIS — Z419 Encounter for procedure for purposes other than remedying health state, unspecified: Secondary | ICD-10-CM

## 2018-10-22 DIAGNOSIS — S72491D Other fracture of lower end of right femur, subsequent encounter for closed fracture with routine healing: Secondary | ICD-10-CM | POA: Diagnosis not present

## 2018-10-22 DIAGNOSIS — Z9049 Acquired absence of other specified parts of digestive tract: Secondary | ICD-10-CM | POA: Diagnosis not present

## 2018-10-22 DIAGNOSIS — M25561 Pain in right knee: Secondary | ICD-10-CM | POA: Diagnosis present

## 2018-10-22 DIAGNOSIS — Z85828 Personal history of other malignant neoplasm of skin: Secondary | ICD-10-CM | POA: Diagnosis not present

## 2018-10-22 DIAGNOSIS — M6281 Muscle weakness (generalized): Secondary | ICD-10-CM | POA: Diagnosis not present

## 2018-10-22 DIAGNOSIS — G8929 Other chronic pain: Secondary | ICD-10-CM | POA: Diagnosis present

## 2018-10-22 DIAGNOSIS — D62 Acute posthemorrhagic anemia: Secondary | ICD-10-CM | POA: Diagnosis not present

## 2018-10-22 DIAGNOSIS — Z9842 Cataract extraction status, left eye: Secondary | ICD-10-CM | POA: Diagnosis not present

## 2018-10-22 DIAGNOSIS — Z96659 Presence of unspecified artificial knee joint: Secondary | ICD-10-CM

## 2018-10-22 DIAGNOSIS — K5790 Diverticulosis of intestine, part unspecified, without perforation or abscess without bleeding: Secondary | ICD-10-CM | POA: Diagnosis present

## 2018-10-22 DIAGNOSIS — K219 Gastro-esophageal reflux disease without esophagitis: Secondary | ICD-10-CM | POA: Diagnosis not present

## 2018-10-22 DIAGNOSIS — G4730 Sleep apnea, unspecified: Secondary | ICD-10-CM | POA: Diagnosis present

## 2018-10-22 DIAGNOSIS — Z833 Family history of diabetes mellitus: Secondary | ICD-10-CM | POA: Diagnosis not present

## 2018-10-22 DIAGNOSIS — Z90710 Acquired absence of both cervix and uterus: Secondary | ICD-10-CM | POA: Diagnosis not present

## 2018-10-22 DIAGNOSIS — I10 Essential (primary) hypertension: Secondary | ICD-10-CM | POA: Diagnosis not present

## 2018-10-22 DIAGNOSIS — S72491A Other fracture of lower end of right femur, initial encounter for closed fracture: Secondary | ICD-10-CM | POA: Diagnosis not present

## 2018-10-22 DIAGNOSIS — M549 Dorsalgia, unspecified: Secondary | ICD-10-CM | POA: Diagnosis not present

## 2018-10-22 DIAGNOSIS — K3184 Gastroparesis: Secondary | ICD-10-CM | POA: Diagnosis present

## 2018-10-22 DIAGNOSIS — E7800 Pure hypercholesterolemia, unspecified: Secondary | ICD-10-CM | POA: Diagnosis present

## 2018-10-22 DIAGNOSIS — M2550 Pain in unspecified joint: Secondary | ICD-10-CM | POA: Diagnosis not present

## 2018-10-22 DIAGNOSIS — K449 Diaphragmatic hernia without obstruction or gangrene: Secondary | ICD-10-CM | POA: Diagnosis present

## 2018-10-22 DIAGNOSIS — Z96649 Presence of unspecified artificial hip joint: Secondary | ICD-10-CM | POA: Diagnosis present

## 2018-10-22 DIAGNOSIS — E1140 Type 2 diabetes mellitus with diabetic neuropathy, unspecified: Secondary | ICD-10-CM | POA: Diagnosis present

## 2018-10-22 DIAGNOSIS — S72401A Unspecified fracture of lower end of right femur, initial encounter for closed fracture: Secondary | ICD-10-CM | POA: Diagnosis not present

## 2018-10-22 DIAGNOSIS — I2510 Atherosclerotic heart disease of native coronary artery without angina pectoris: Secondary | ICD-10-CM | POA: Diagnosis not present

## 2018-10-22 DIAGNOSIS — J45909 Unspecified asthma, uncomplicated: Secondary | ICD-10-CM | POA: Diagnosis not present

## 2018-10-22 DIAGNOSIS — E1340 Other specified diabetes mellitus with diabetic neuropathy, unspecified: Secondary | ICD-10-CM | POA: Diagnosis not present

## 2018-10-22 DIAGNOSIS — Y92012 Bathroom of single-family (private) house as the place of occurrence of the external cause: Secondary | ICD-10-CM | POA: Diagnosis not present

## 2018-10-22 DIAGNOSIS — G43909 Migraine, unspecified, not intractable, without status migrainosus: Secondary | ICD-10-CM | POA: Diagnosis not present

## 2018-10-22 DIAGNOSIS — W19XXXA Unspecified fall, initial encounter: Secondary | ICD-10-CM | POA: Diagnosis present

## 2018-10-22 DIAGNOSIS — M9711XA 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, initial encounter: Secondary | ICD-10-CM

## 2018-10-22 DIAGNOSIS — M978XXA Periprosthetic fracture around other internal prosthetic joint, initial encounter: Secondary | ICD-10-CM

## 2018-10-22 DIAGNOSIS — R5381 Other malaise: Secondary | ICD-10-CM | POA: Diagnosis not present

## 2018-10-22 DIAGNOSIS — M1990 Unspecified osteoarthritis, unspecified site: Secondary | ICD-10-CM | POA: Diagnosis not present

## 2018-10-22 DIAGNOSIS — Z7401 Bed confinement status: Secondary | ICD-10-CM | POA: Diagnosis not present

## 2018-10-22 DIAGNOSIS — M810 Age-related osteoporosis without current pathological fracture: Secondary | ICD-10-CM | POA: Diagnosis present

## 2018-10-22 DIAGNOSIS — D500 Iron deficiency anemia secondary to blood loss (chronic): Secondary | ICD-10-CM | POA: Diagnosis not present

## 2018-10-22 LAB — CBC WITH DIFFERENTIAL/PLATELET
Abs Immature Granulocytes: 0.12 10*3/uL — ABNORMAL HIGH (ref 0.00–0.07)
Basophils Absolute: 0 10*3/uL (ref 0.0–0.1)
Basophils Relative: 0 %
EOS PCT: 0 %
Eosinophils Absolute: 0 10*3/uL (ref 0.0–0.5)
HEMATOCRIT: 31.7 % — AB (ref 36.0–46.0)
HEMOGLOBIN: 9.3 g/dL — AB (ref 12.0–15.0)
Immature Granulocytes: 1 %
LYMPHS ABS: 1.4 10*3/uL (ref 0.7–4.0)
LYMPHS PCT: 13 %
MCH: 29.6 pg (ref 26.0–34.0)
MCHC: 29.3 g/dL — ABNORMAL LOW (ref 30.0–36.0)
MCV: 101 fL — AB (ref 80.0–100.0)
MONOS PCT: 7 %
Monocytes Absolute: 0.7 10*3/uL (ref 0.1–1.0)
Neutro Abs: 8 10*3/uL — ABNORMAL HIGH (ref 1.7–7.7)
Neutrophils Relative %: 79 %
PLATELETS: 418 10*3/uL — AB (ref 150–400)
RBC: 3.14 MIL/uL — AB (ref 3.87–5.11)
RDW: 15.9 % — AB (ref 11.5–15.5)
WBC: 10.2 10*3/uL (ref 4.0–10.5)
nRBC: 0 % (ref 0.0–0.2)

## 2018-10-22 LAB — BASIC METABOLIC PANEL
ANION GAP: 9 (ref 5–15)
BUN: 13 mg/dL (ref 8–23)
CALCIUM: 8.9 mg/dL (ref 8.9–10.3)
CO2: 23 mmol/L (ref 22–32)
Chloride: 102 mmol/L (ref 98–111)
Creatinine, Ser: 0.81 mg/dL (ref 0.44–1.00)
Glucose, Bld: 156 mg/dL — ABNORMAL HIGH (ref 70–99)
POTASSIUM: 3.9 mmol/L (ref 3.5–5.1)
Sodium: 134 mmol/L — ABNORMAL LOW (ref 135–145)

## 2018-10-22 MED ORDER — ALBUTEROL SULFATE HFA 108 (90 BASE) MCG/ACT IN AERS: 2.0000 | INHALATION_SPRAY | RESPIRATORY_TRACT | Status: DC | PRN

## 2018-10-22 MED ORDER — ROSUVASTATIN CALCIUM 5 MG PO TABS
5.0000 mg | ORAL_TABLET | ORAL | Status: DC
  Filled 2018-10-22: qty 1

## 2018-10-22 MED ORDER — MOMETASONE FURO-FORMOTEROL FUM 100-5 MCG/ACT IN AERO
2.0000 | INHALATION_SPRAY | Freq: Two times a day (BID) | RESPIRATORY_TRACT | Status: DC
  Administered 2018-10-23 – 2018-10-26 (×6): 2 via RESPIRATORY_TRACT
  Filled 2018-10-22 (×2): qty 8.8

## 2018-10-22 MED ORDER — ENOXAPARIN SODIUM 40 MG/0.4ML ~~LOC~~ SOLN
40.0000 mg | SUBCUTANEOUS | Status: DC
  Filled 2018-10-22 (×2): qty 0.4

## 2018-10-22 MED ORDER — ONDANSETRON HCL 4 MG PO TABS: 4.0000 mg | ORAL_TABLET | Freq: Four times a day (QID) | ORAL | Status: DC | PRN

## 2018-10-22 MED ORDER — MIRABEGRON ER 25 MG PO TB24
25.0000 mg | ORAL_TABLET | Freq: Every day | ORAL | Status: DC
  Administered 2018-10-22 – 2018-10-25 (×2): 25 mg via ORAL
  Filled 2018-10-22 (×4): qty 1

## 2018-10-22 MED ORDER — ALBUTEROL SULFATE (2.5 MG/3ML) 0.083% IN NEBU: 2.5000 mg | INHALATION_SOLUTION | Freq: Four times a day (QID) | RESPIRATORY_TRACT | Status: DC | PRN

## 2018-10-22 MED ORDER — PANTOPRAZOLE SODIUM 40 MG PO TBEC
40.0000 mg | DELAYED_RELEASE_TABLET | Freq: Every day | ORAL | Status: DC
  Administered 2018-10-22 – 2018-10-26 (×4): 40 mg via ORAL
  Filled 2018-10-22 (×5): qty 1

## 2018-10-22 MED ORDER — CITALOPRAM HYDROBROMIDE 20 MG PO TABS
40.0000 mg | ORAL_TABLET | Freq: Every day | ORAL | Status: DC
  Administered 2018-10-22 – 2018-10-25 (×4): 40 mg via ORAL
  Filled 2018-10-22 (×5): qty 2

## 2018-10-22 MED ORDER — ALPRAZOLAM 0.25 MG PO TABS
0.2500 mg | ORAL_TABLET | Freq: Every day | ORAL | Status: DC
  Administered 2018-10-22 – 2018-10-25 (×4): 0.25 mg via ORAL
  Filled 2018-10-22 (×4): qty 1

## 2018-10-22 MED ORDER — ONDANSETRON HCL 4 MG/2ML IJ SOLN
4.0000 mg | Freq: Once | INTRAMUSCULAR | Status: AC
  Administered 2018-10-22: 4 mg via INTRAVENOUS
  Filled 2018-10-22: qty 2

## 2018-10-22 MED ORDER — FENTANYL CITRATE (PF) 100 MCG/2ML IJ SOLN
50.0000 ug | Freq: Once | INTRAMUSCULAR | Status: AC
  Administered 2018-10-22: 50 ug via INTRAVENOUS
  Filled 2018-10-22: qty 2

## 2018-10-22 MED ORDER — ONDANSETRON HCL 4 MG/2ML IJ SOLN
4.0000 mg | Freq: Three times a day (TID) | INTRAMUSCULAR | Status: DC | PRN
  Administered 2018-10-24: 4 mg via INTRAVENOUS
  Filled 2018-10-22: qty 2

## 2018-10-22 MED ORDER — DOCUSATE SODIUM 100 MG PO CAPS
100.0000 mg | ORAL_CAPSULE | Freq: Two times a day (BID) | ORAL | Status: DC
  Administered 2018-10-22: 100 mg via ORAL
  Filled 2018-10-22: qty 1

## 2018-10-22 MED ORDER — GABAPENTIN 400 MG PO CAPS
400.0000 mg | ORAL_CAPSULE | Freq: Every day | ORAL | Status: DC
  Administered 2018-10-22 – 2018-10-25 (×4): 400 mg via ORAL
  Filled 2018-10-22 (×4): qty 1

## 2018-10-22 MED ORDER — SUCRALFATE 1 G PO TABS
1.0000 g | ORAL_TABLET | Freq: Four times a day (QID) | ORAL | Status: DC
  Administered 2018-10-22 – 2018-10-25 (×4): 1 g via ORAL
  Filled 2018-10-22 (×8): qty 1

## 2018-10-22 MED ORDER — SODIUM CHLORIDE 0.9 % IV SOLN
INTRAVENOUS | Status: AC
  Administered 2018-10-22: 05:00:00 via INTRAVENOUS

## 2018-10-22 MED ORDER — FENTANYL CITRATE (PF) 100 MCG/2ML IJ SOLN
100.0000 ug | Freq: Once | INTRAMUSCULAR | Status: AC
  Administered 2018-10-22: 100 ug via INTRAVENOUS
  Filled 2018-10-22: qty 2

## 2018-10-22 MED ORDER — FENTANYL CITRATE (PF) 100 MCG/2ML IJ SOLN
50.0000 ug | INTRAMUSCULAR | Status: DC | PRN
  Administered 2018-10-22 – 2018-10-23 (×14): 50 ug via INTRAVENOUS
  Filled 2018-10-22 (×14): qty 2

## 2018-10-22 NOTE — ED Notes (Signed)
Patient transported to X-ray 

## 2018-10-22 NOTE — ED Notes (Signed)
ED TO INPATIENT HANDOFF REPORT  Name/Age/Gender Alexis Rios 71 y.o. female  Code Status Code Status History    Date Active Date Inactive Code Status Order ID Comments User Context   10/10/2018 1154 10/13/2018 1702 Full Code [id number]  Rod Can, MD Inpatient   03/08/2015 1409 03/09/2015 0338 Full Code [id number]  Nelson Chimes, MD HOV   11/13/2013 1655 11/14/2013 1906 Full Code [id number]  Jonetta Osgood, MD Inpatient    Advance Directive Documentation     Most Recent Value  Type of Advance Directive  Healthcare Power of Attorney, Living will  Pre-existing out of facility DNR order (yellow form or pink MOST form)  -  "MOST" Form in Place?  -      Home/SNF/Other Home  Chief Complaint Knee Pain; Post-op Problem   Level of Care/Admitting Diagnosis ED Disposition    ED Disposition Condition [place]: [facility] [[id number]]  Level of Care: Med-Surg [16]  Diagnosis: 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 [[id number]]  Admitting Physician: Pe Ell, [place]  Attending Physician: Rod Can [[id number]]  Estimated length of stay: past midnight tomorrow  Certification:: I certify this patient will need inpatient services for at least 2 midnights  PT Class (Do Not Modify): Inpatient [101]  PT Acc Code (Do Not Modify): Private [1]       Medical History Past Medical History:  Diagnosis Date  . Anemia   . Anxiety   . Arthritis   . Asthma   . CAD (coronary artery disease)    Cath 2009, AV groove CX 30%, RCA 20 %  . Candidiasis of skin and nail   . Carpal tunnel syndrome   . Chronic back pain   . Chronic headaches    migraines  . Complication of anesthesia    if no breathing tx before anesthesia wakes with asthma  . COPD (chronic obstructive pulmonary disease) ([place])   . Depression   . Diabetes mellitus    DIET CONTROLLED NOW  . Diverticulosis   . Gastroparesis    ?  Marland Kitchen GERD (gastroesophageal  reflux disease)   . H/O echocardiogram    Echo 09/2011: Mild LVH, EF 38-25%, grade 1 diastolic dysfunction, trivial AI, mild MR, mild LAE.  . H/O exercise stress test    Dobutamine Myoview 09/2011: No scar or ischemia, no EKG changes, study not gated.  . HH (hiatus hernia)   . History of gallstones   . History of pneumonia   . Hypercholesteremia   . Hypertension   . IBS (irritable bowel syndrome)   . Osteoporosis   . Peripheral neuropathy    LEGS AND FEET  . Pneumonia LAST TIME 8 YRS AGO  . S/P cardiac catheterization    LHC 03/2008: Mid AV groove circumflex 30%, mid RCA 20%, EF 60%  . Seasonal allergies   . Skin cancer    scc/bcc  . Skin yeast infection 07/05/2015  . Sleep apnea    cpap   . Stones in the urinary tract   . Subclinical hypothyroidism   . UTI (urinary tract infection)     Allergies Allergies  Allergen Reactions  . Sulfa Antibiotics Swelling    Mouth and throat   . Codeine Itching    TAKES OK WITH BENADRYL  . Cymbalta [Duloxetine Hcl] Other (See Comments)    Cannot urinate  . Keflex [Cephalexin]   . Macrodantin [Nitrofurantoin Macrocrystal] Nausea And Vomiting  . Mucinex [Guaifenesin Er] Other (See Comments)  Urinary retention  . Tape Rash    IV Location/Drains/Wounds Patient Lines/Drains/Airways Status   Active Line/Drains/Airways    Name:   Placement date:   Placement time:   Site:   Days:   Peripheral IV 10/22/18 Left Antecubital   10/22/18    0304    Antecubital   less than 1   External Urinary Catheter   10/22/18    0244    -   less than 1   Incision (Closed) 10/10/18 Knee Right   10/10/18    0934     12          Labs/Imaging Results for orders placed or performed during the hospital encounter of 10/22/18 (from the past 48 hour(s))  CBC with Differential/Platelet     Status: Abnormal   Collection Time: 10/22/18  2:30 AM  Result Value Ref Range   WBC 10.2 4.0 - 10.5 K/uL   RBC 3.14 (L) 3.87 - 5.11 MIL/uL   Hemoglobin 9.3 (L) 12.0 -  15.0 g/dL   HCT 31.7 (L) 36.0 - 46.0 %   MCV 101.0 (H) 80.0 - 100.0 fL   MCH 29.6 26.0 - 34.0 pg   MCHC 29.3 (L) 30.0 - 36.0 g/dL   RDW 15.9 (H) 11.5 - 15.5 %   Platelets 418 (H) 150 - 400 K/uL   nRBC 0.0 0.0 - 0.2 %   Neutrophils Relative % 79 %   Neutro Abs 8.0 (H) 1.7 - 7.7 K/uL   Lymphocytes Relative 13 %   Lymphs Abs 1.4 0.7 - 4.0 K/uL   Monocytes Relative 7 %   Monocytes Absolute 0.7 0.1 - 1.0 K/uL   Eosinophils Relative 0 %   Eosinophils Absolute 0.0 0.0 - 0.5 K/uL   Basophils Relative 0 %   Basophils Absolute 0.0 0.0 - 0.1 K/uL   Immature Granulocytes 1 %   Abs Immature Granulocytes 0.12 (H) 0.00 - 0.07 K/uL    Comment: Performed at [facility], [place] [address], [place], [place] [address]  Basic metabolic panel     Status: Abnormal   Collection Time: 10/22/18  2:30 AM  Result Value Ref Range   Sodium 134 (L) 135 - 145 mmol/L   Potassium 3.9 3.5 - 5.1 mmol/L   Chloride 102 98 - 111 mmol/L   CO2 23 22 - 32 mmol/L   Glucose, Bld 156 (H) 70 - 99 mg/dL   BUN 13 8 - 23 mg/dL   Creatinine, Ser 0.81 0.44 - 1.00 mg/dL   Calcium 8.9 8.9 - 10.3 mg/dL   GFR calc non Af Amer >60 >60 mL/min   GFR calc Af Amer >60 >60 mL/min    Comment: (NOTE) The eGFR has been calculated using the CKD EPI equation. This calculation has not been validated in all clinical situations. eGFR's persistently <60 mL/min signify possible Chronic Kidney Disease.    Anion gap 9 5 - 15    Comment: Performed at [facility], [place] [address] [place], [place] [address]   Dg Knee Complete 4 Views Right  Result Date: 10/22/2018 CLINICAL DATA:  Initial evaluation for acute pain, recent total knee arthroplasty. EXAM: RIGHT KNEE - COMPLETE 4+ VIEW COMPARISON:  Prior radiograph from 10/10/2018. FINDINGS: Right total knee arthroplasty in place. There is an acute oblique periprosthetic fracture involving the distal right femoral shaft just above the arthroplasty. Associated  posterior displacement with overlap. Distal femoral and proximal tibial components otherwise in stable position. Patellar component angulated and slightly  displaced anteriorly by the fracture. Surrounding soft tissue swelling. IMPRESSION: Acute oblique periprosthetic fracture of the distal femoral shaft just above the right total knee arthroplasty. Electronically Signed   By: Jeannine Boga M.D.   On: 10/22/2018 03:34    Pending Labs Unresulted Labs (From admission, onward)    Start     Ordered   Signed and Held  CBC  (enoxaparin (LOVENOX)    CrCl >/= 30 ml/min)  Once,   R    Comments:  Baseline for enoxaparin therapy IF NOT ALREADY DRAWN.  Notify MD if PLT < 100 K.    Signed and Held   Signed and Held  Creatinine, serum  (enoxaparin (LOVENOX)    CrCl >/= 30 ml/min)  Once,   R    Comments:  Baseline for enoxaparin therapy IF NOT ALREADY DRAWN.    Signed and Held   Signed and Held  Creatinine, serum  (enoxaparin (LOVENOX)    CrCl >/= 30 ml/min)  Weekly,   R    Comments:  while on enoxaparin therapy    Signed and Held          Vitals/Pain Today's Vitals   10/22/18 1130 10/22/18 1131 10/22/18 1208 10/22/18 1230  BP: 128/73   (!) 146/59  Pulse: 73   69  Resp: 16   16  Temp:      TempSrc:      SpO2: 100%   99%  PainSc:  7  6      Isolation Precautions No active isolations  Medications Medications  fentaNYL (SUBLIMAZE) injection 50 mcg (50 mcg Intravenous Given 10/22/18 1301)  0.9 %  sodium chloride infusion ( Intravenous Rate/Dose Verify 10/22/18 1323)  ondansetron (ZOFRAN) injection 4 mg (has no administration in time range)  ondansetron (ZOFRAN) injection 4 mg (4 mg Intravenous Given 10/22/18 0301)  fentaNYL (SUBLIMAZE) injection 50 mcg (50 mcg Intravenous Given 10/22/18 0302)  fentaNYL (SUBLIMAZE) injection 100 mcg (100 mcg Intravenous Given 10/22/18 0409)    Mobility walks with device

## 2018-10-22 NOTE — ED Provider Notes (Signed)
[facility] DEPT Provider Note: Georgena Spurling, MD, FACEP  CSN: [id number] MRN: [id number] ARRIVAL: 10/21/18 at [place]  Knee Pain (right)   HISTORY OF PRESENT ILLNESS  10/22/18 2:21 AM Alexis Rios is a 72 y.o. female her right knee replacement on October 31.  Physical therapy yesterday she began having increased pain in the right knee which subsequently worsened.  She felt it pop twice.  She is now unable to bear weight on that leg due to pain but she has not actually fallen as her family helped her to the ground earlier.  She rates her pain as a 10 out of 10, worse with attempted movement or weightbearing.  She had swelling and ecchymosis of that leg postoperatively which have improved.  She has not had a fever or redness develop.   Past Medical History:  Diagnosis Date  . Anemia   . Anxiety   . Arthritis   . Asthma   . CAD (coronary artery disease)    Cath 2009, AV groove CX 30%, RCA 20 %  . Candidiasis of skin and nail   . Carpal tunnel syndrome   . Chronic back pain   . Chronic headaches    migraines  . Complication of anesthesia    if no breathing tx before anesthesia wakes with asthma  . COPD (chronic obstructive pulmonary disease) ([place])   . Depression   . Diabetes mellitus    DIET CONTROLLED NOW  . Diverticulosis   . Gastroparesis    ?  Marland Kitchen GERD (gastroesophageal reflux disease)   . H/O echocardiogram    Echo 09/2011: Mild LVH, EF 71-69%, grade 1 diastolic dysfunction, trivial AI, mild MR, mild LAE.  . H/O exercise stress test    Dobutamine Myoview 09/2011: No scar or ischemia, no EKG changes, study not gated.  . HH (hiatus hernia)   . History of gallstones   . History of pneumonia   . Hypercholesteremia   . Hypertension   . IBS (irritable bowel syndrome)   . Osteoporosis   . Peripheral neuropathy    LEGS AND FEET  . Pneumonia LAST TIME 8 YRS AGO  . S/P cardiac catheterization    LHC 03/2008: Mid AV groove circumflex  30%, mid RCA 20%, EF 60%  . Seasonal allergies   . Skin cancer    scc/bcc  . Skin yeast infection 07/05/2015  . Sleep apnea    cpap   . Stones in the urinary tract   . Subclinical hypothyroidism   . UTI (urinary tract infection)     Past Surgical History:  Procedure Laterality Date  . ABDOMINAL HYSTERECTOMY     partial  . APPENDECTOMY    . ARTHOSCOPIC ROTAOR CUFF REPAIR Right    X 2  . BACK SURGERY     X 4  . BACK SURGERY    . BREAST SURGERY     lump x 2 R  . CHOLECYSTECTOMY    . EYE SURGERY  2018   IOC FOR CATARACTS BOTH EYES,   . EYE SURGERY  2019   YAG LASER FOR CATARACT FILM  . FOOT SURGERY     R  . KNEE ARTHROPLASTY Right 10/10/2018   Procedure: RIGHT TOTAL KNEE ARTHROPLASTY WITH COMPUTER NAVIGATION;  Surgeon: Rod Can, MD;  Location: [facility];  Service: Orthopedics;  Laterality: Right;  Needs RNFA  . KNEE SURGERY     R  . NOSE SURGERY     skin cancer  excision  . RIGHT WRIST SURGERY WITH PLATE  47/82/9562  . shoulder surg     x2 R  . TOE AMPUTATION     rightX 1 in past LEFT X 1 done sept 2019 at [facility]  . TONSILLECTOMY    . UMBILICAL HERNIA REPAIR    . WRIST SURGERY     L    Family History  Problem Relation Age of Onset  . Allergies Daughter   . Liver cancer Brother   . Hypertension Brother   . Diabetes Brother   . Leukemia Brother   . Emphysema Brother   . Heart disease Mother   . Heart disease Father   . Stroke Father   . Hypertension Sister   . Diabetes Sister   . Diabetes Other        all brothers and sister x 5    Social History   Tobacco Use  . Smoking status: Never Smoker  . Smokeless tobacco: Never Used  Substance Use Topics  . Alcohol use: No  . Drug use: No    Prior to Admission medications   Medication Sig Start Date End Date Taking? Authorizing Provider  albuterol (PROVENTIL) (2.5 MG/3ML) 0.083% nebulizer solution Take 2.5 mg by nebulization every 6 (six) hours as needed for shortness of breath.     [provider]  albuterol (VENTOLIN HFA) 108 (90 Base) MCG/ACT inhaler Inhale 2 puffs into the lungs every 4 (four) hours as needed for wheezing or shortness of breath. Patient taking differently: Inhale 2 puffs into the lungs every 4 (four) hours as needed for wheezing or shortness of breath. PROAIR 05/30/17   Kozlow, Donnamarie Poag, MD  ALPRAZolam Duanne Moron) 0.25 MG tablet Take 0.25 mg by mouth at bedtime.     [provider]  aspirin 81 MG chewable tablet Chew 1 tablet (81 mg total) by mouth 2 (two) times daily. 10/11/18   Swinteck, Aaron Edelman, MD  budesonide-formoterol (SYMBICORT) 80-4.5 MCG/ACT inhaler Inhale 2 puffs into the lungs at bedtime.     [provider]  citalopram (CELEXA) 40 MG tablet Take 40 mg by mouth at bedtime.     [provider]  cyanocobalamin (,VITAMIN B-12,) 1000 MCG/ML injection Inject 1,000 mcg into the muscle every 30 (thirty) days.  07/11/16   [provider]  docusate sodium (COLACE) 100 MG capsule Take 1 capsule (100 mg total) by mouth 2 (two) times daily. 10/11/18   Swinteck, Aaron Edelman, MD  gabapentin (NEURONTIN) 400 MG capsule Take 400 mg by mouth at bedtime.     [provider]  HYDROcodone-acetaminophen (NORCO/VICODIN) 5-325 MG tablet Take 1-2 tablets by mouth every 6 (six) hours as needed for moderate pain. 10/11/18   Swinteck, Aaron Edelman, MD  mirabegron ER (MYRBETRIQ) 25 MG TB24 tablet Take 25 mg by mouth at bedtime.    [provider]  mometasone-formoterol (DULERA) 200-5 MCG/ACT AERO Inhale 2 puffs into the lungs 2 (two) times daily. Patient not taking: Reported on 09/26/2018 05/30/17   Jiles Prows, MD  omeprazole (PRILOSEC) 40 MG capsule Take one capsule every morning as directed. Patient taking differently: Take 40 mg by mouth daily as needed (heartburn). Take one capsule every morning as directed. 11/01/16   Kozlow, Donnamarie Poag, MD  ondansetron (ZOFRAN) 4 MG tablet Take 1 tablet (4 mg total) by mouth every 6 (six) hours as needed for nausea. 10/11/18    Swinteck, Aaron Edelman, MD  ranitidine (ZANTAC) 300 MG tablet Take 1 tablet (300 mg total) by mouth at  bedtime. Patient not taking: Reported on 09/26/2018 11/01/16   Jiles Prows, MD  rosuvastatin (CRESTOR) 5 MG tablet Take 5 mg by mouth every Sunday.  08/27/18   [provider]  senna (SENOKOT) 8.6 MG TABS tablet Take 1 tablet (8.6 mg total) by mouth 2 (two) times daily. 10/11/18   Swinteck, Aaron Edelman, MD  sucralfate (CARAFATE) 1 g tablet Take 1 tablet (1 g total) by mouth 4 (four) times daily. Patient not taking: Reported on 09/26/2018 11/01/16   Jiles Prows, MD  topiramate (TOPAMAX) 100 MG tablet Take 100 mg by mouth at bedtime.    [provider]  Vitamin D, Ergocalciferol, (DRISDOL) 50000 units CAPS capsule Take 50,000 Units by mouth every Saturday.     [provider]    Allergies Sulfa antibiotics; Codeine; Cymbalta [duloxetine hcl]; Keflex [cephalexin]; Macrodantin [nitrofurantoin macrocrystal]; Mucinex [guaifenesin er]; and Tape   REVIEW OF SYSTEMS  Negative except as noted here or in the History of Present Illness.   PHYSICAL EXAMINATION  Initial Vital Signs Blood pressure (!) 156/55, pulse 60, temperature 98.3 F (36.8 C), temperature source Oral, resp. rate 16, SpO2 100 %.  Examination General: Well-developed, well-nourished female in no acute distress; appearance consistent with age of record HENT: normocephalic; atraumatic Eyes: pupils equal, round and reactive to light; extraocular muscles intact; bilateral pseudophakia Neck: supple Heart: regular rate and rhythm Lungs: clear to auscultation bilaterally Abdomen: soft; nondistended; nontender; suprapubic fullness; bowel sounds present Extremities: Pulses normal; deformity and tenderness of right knee with pain on attempted range of motion; edema and resolving ecchymosis of right lower extremity without erythema or warmth:    Neurologic: Awake, alert and oriented; motor function intact in all  extremities and symmetric; no facial droop Skin: Warm and dry Psychiatric: Normal mood and affect   RESULTS  Summary of this visit's results, reviewed by myself:   EKG Interpretation  Date/Time:    Ventricular Rate:    PR Interval:    QRS Duration:   QT Interval:    QTC Calculation:   R Axis:     Text Interpretation:        Laboratory Studies: Results for orders placed or performed during the hospital encounter of 10/22/18 (from the past 24 hour(s))  CBC with Differential/Platelet     Status: Abnormal   Collection Time: 10/22/18  2:30 AM  Result Value Ref Range   WBC 10.2 4.0 - 10.5 K/uL   RBC 3.14 (L) 3.87 - 5.11 MIL/uL   Hemoglobin 9.3 (L) 12.0 - 15.0 g/dL   HCT 31.7 (L) 36.0 - 46.0 %   MCV 101.0 (H) 80.0 - 100.0 fL   MCH 29.6 26.0 - 34.0 pg   MCHC 29.3 (L) 30.0 - 36.0 g/dL   RDW 15.9 (H) 11.5 - 15.5 %   Platelets 418 (H) 150 - 400 K/uL   nRBC 0.0 0.0 - 0.2 %   Neutrophils Relative % 79 %   Neutro Abs 8.0 (H) 1.7 - 7.7 K/uL   Lymphocytes Relative 13 %   Lymphs Abs 1.4 0.7 - 4.0 K/uL   Monocytes Relative 7 %   Monocytes Absolute 0.7 0.1 - 1.0 K/uL   Eosinophils Relative 0 %   Eosinophils Absolute 0.0 0.0 - 0.5 K/uL   Basophils Relative 0 %   Basophils Absolute 0.0 0.0 - 0.1 K/uL   Immature Granulocytes 1 %   Abs Immature Granulocytes 0.12 (H) 0.00 - 0.07 K/uL  Basic metabolic panel     Status: Abnormal  Collection Time: 10/22/18  2:30 AM  Result Value Ref Range   Sodium 134 (L) 135 - 145 mmol/L   Potassium 3.9 3.5 - 5.1 mmol/L   Chloride 102 98 - 111 mmol/L   CO2 23 22 - 32 mmol/L   Glucose, Bld 156 (H) 70 - 99 mg/dL   BUN 13 8 - 23 mg/dL   Creatinine, Ser 0.81 0.44 - 1.00 mg/dL   Calcium 8.9 8.9 - 10.3 mg/dL   GFR calc non Af Amer >60 >60 mL/min   GFR calc Af Amer >60 >60 mL/min   Anion gap 9 5 - 15   Imaging Studies: Dg Knee Complete 4 Views Right  Result Date: 10/22/2018 CLINICAL DATA:  Initial evaluation for acute pain, recent total knee  arthroplasty. EXAM: RIGHT KNEE - COMPLETE 4+ VIEW COMPARISON:  Prior radiograph from 10/10/2018. FINDINGS: Right total knee arthroplasty in place. There is an acute oblique periprosthetic fracture involving the distal right femoral shaft just above the arthroplasty. Associated posterior displacement with overlap. Distal femoral and proximal tibial components otherwise in stable position. Patellar component angulated and slightly displaced anteriorly by the fracture. Surrounding soft tissue swelling. IMPRESSION: Acute oblique periprosthetic fracture of the distal femoral shaft just above the right total knee arthroplasty. Electronically Signed   By: Jeannine Boga M.D.   On: 10/22/2018 03:34    ED COURSE and MDM  Nursing notes and initial vitals signs, including pulse oximetry, reviewed.  Vitals:   10/22/18 0003 10/22/18 0109  BP: (!) 154/81 (!) 156/55  Pulse: 79 60  Resp: 14 16  Temp: 98.3 F (36.8 C)   TempSrc: Oral   SpO2: 99% 100%   4:10 AM Patient's right lower extremity placed in knee immobilizer while holding in-line traction on the leg.  Distal pulses are still +2.  Discussed with Dr. Wynelle Link orthopedics and he or Dr. Lyla Glassing will admit the patient for definitive treatment.  PROCEDURES    ED DIAGNOSES     ICD-10-CM   1. Periprosthetic fracture of shaft of femur M97.8XXA    L97.673        Shanon Rosser, MD 10/22/18 [phone]

## 2018-10-22 NOTE — ED Notes (Addendum)
Patient given meal tray.

## 2018-10-22 NOTE — ED Notes (Signed)
Patient given meal tray.

## 2018-10-22 NOTE — H&P (Signed)
Patient ID: Alexis Rios MRN: [id number] DOB/AGE: 08/31/1946 72 y.o.  Admit date: 10/22/2018  Chief Complaint: Right knee pain  Subjective: Alexis Rios is a 72 y/o female who presented to [facility] Emergency Department on 10/22/2018 for right knee pain. She is s/p right total knee arthroplasty performed by Rod Can, MD on 10/10/2018. Patient states last night she was using the bathroom around 2130, tried to stand from the toilet and felt a crack followed by severe pain. She was unable to bear weight after this, but did not fall. Her husband and son assisted her to the car and brought her to the hospital. Denies falling prior to event. She does endorse history of osteoporosis. No use of anticoagulants prior to DVT prophylaxis initiated by Dr. Lyla Glassing following TKA. Endorses severe pain with any attempted movement of the RLE. Workup in the emergency department showed right knee oblique periprosthetic fracture.  Allergies: Allergies  Allergen Reactions  . Sulfa Antibiotics Swelling    Mouth and throat   . Codeine Itching    TAKES OK WITH BENADRYL  . Cymbalta [Duloxetine Hcl] Other (See Comments)    Cannot urinate  . Keflex [Cephalexin]   . Macrodantin [Nitrofurantoin Macrocrystal] Nausea And Vomiting  . Mucinex [Guaifenesin Er] Other (See Comments)    Urinary retention  . Tape Rash     Past Medical History: Past Medical History:  Diagnosis Date  . Anemia   . Anxiety   . Arthritis   . Asthma   . CAD (coronary artery disease)    Cath 2009, AV groove CX 30%, RCA 20 %  . Candidiasis of skin and nail   . Carpal tunnel syndrome   . Chronic back pain   . Chronic headaches    migraines  . Complication of anesthesia    if no breathing tx before anesthesia wakes with asthma  . COPD (chronic obstructive pulmonary disease) ([place])   . Depression   . Diabetes mellitus    DIET CONTROLLED NOW  . Diverticulosis   . Gastroparesis    ?  Marland Kitchen GERD (gastroesophageal  reflux disease)   . H/O echocardiogram    Echo 09/2011: Mild LVH, EF 25-36%, grade 1 diastolic dysfunction, trivial AI, mild MR, mild LAE.  . H/O exercise stress test    Dobutamine Myoview 09/2011: No scar or ischemia, no EKG changes, study not gated.  . HH (hiatus hernia)   . History of gallstones   . History of pneumonia   . Hypercholesteremia   . Hypertension   . IBS (irritable bowel syndrome)   . Osteoporosis   . Peripheral neuropathy    LEGS AND FEET  . Pneumonia LAST TIME 8 YRS AGO  . S/P cardiac catheterization    LHC 03/2008: Mid AV groove circumflex 30%, mid RCA 20%, EF 60%  . Seasonal allergies   . Skin cancer    scc/bcc  . Skin yeast infection 07/05/2015  . Sleep apnea    cpap   . Stones in the urinary tract   . Subclinical hypothyroidism   . UTI (urinary tract infection)      Past Surgical History: Past Surgical History:  Procedure Laterality Date  . ABDOMINAL HYSTERECTOMY     partial  . APPENDECTOMY    . ARTHOSCOPIC ROTAOR CUFF REPAIR Right    X 2  . BACK SURGERY     X 4  . BACK SURGERY    . BREAST SURGERY     lump x 2 R  .  CHOLECYSTECTOMY    . EYE SURGERY  2018   IOC FOR CATARACTS BOTH EYES,   . EYE SURGERY  2019   YAG LASER FOR CATARACT FILM  . FOOT SURGERY     R  . KNEE ARTHROPLASTY Right 10/10/2018   Procedure: RIGHT TOTAL KNEE ARTHROPLASTY WITH COMPUTER NAVIGATION;  Surgeon: Rod Can, MD;  Location: [facility];  Service: Orthopedics;  Laterality: Right;  Needs RNFA  . KNEE SURGERY     R  . NOSE SURGERY     skin cancer excision  . RIGHT WRIST SURGERY WITH PLATE  51/88/4166  . shoulder surg     x2 R  . TOE AMPUTATION     rightX 1 in past LEFT X 1 done sept 2019 [facility]  . TONSILLECTOMY    . UMBILICAL HERNIA REPAIR    . WRIST SURGERY     L     Family History: Family History  Problem Relation Age of Onset  . Allergies Daughter   . Liver cancer Brother   . Hypertension Brother   . Diabetes Brother   . Leukemia Brother   . Emphysema  Brother   . Heart disease Mother   . Heart disease Father   . Stroke Father   . Hypertension Sister   . Diabetes Sister   . Diabetes Other        all brothers and sister x 5    Social History: Social History   Tobacco Use  . Smoking status: Never Smoker  . Smokeless tobacco: Never Used  Substance Use Topics  . Alcohol use: No     Review of Systems Pertinent items are noted in HPI.  Physical Exam:  Vitals: Pulse:71 Respirations:18 Blood Pressure:133/58  General: alert and no distress HENT:Head: Normocephalic, no lesions, without obvious abnormality. Neck:supple Chest:clear to auscultation, no wheezes, rales or rhonchi, symmetric air entry Heart:S1, S2 normal, no murmur, rub or gallop, regular rate and rhythm Abdomen:abdomen soft and non-tender Rectal/Breast/Genitalia: Not done, not pertinent to present illness. Musculoskeletal:Neurologically intact Neurovascular intact Sensation intact distally Dorsiflexion/Plantar flexion intact Aquacell in place around TKA incision. Ecchymosis around knee. Knee placed in immobilizer  LABS: Results for orders placed or performed during the hospital encounter of 10/22/18  CBC with Differential/Platelet  Result Value Ref Range   WBC 10.2 4.0 - 10.5 K/uL   RBC 3.14 (L) 3.87 - 5.11 MIL/uL   Hemoglobin 9.3 (L) 12.0 - 15.0 g/dL   HCT 31.7 (L) 36.0 - 46.0 %   MCV 101.0 (H) 80.0 - 100.0 fL   MCH 29.6 26.0 - 34.0 pg   MCHC 29.3 (L) 30.0 - 36.0 g/dL   RDW 15.9 (H) 11.5 - 15.5 %   Platelets 418 (H) 150 - 400 K/uL   nRBC 0.0 0.0 - 0.2 %   Neutrophils Relative % 79 %   Neutro Abs 8.0 (H) 1.7 - 7.7 K/uL   Lymphocytes Relative 13 %   Lymphs Abs 1.4 0.7 - 4.0 K/uL   Monocytes Relative 7 %   Monocytes Absolute 0.7 0.1 - 1.0 K/uL   Eosinophils Relative 0 %   Eosinophils Absolute 0.0 0.0 - 0.5 K/uL   Basophils Relative 0 %   Basophils Absolute 0.0 0.0 - 0.1 K/uL   Immature Granulocytes 1 %   Abs Immature Granulocytes 0.12 (H) 0.00 -  0.07 K/uL  Basic metabolic panel  Result Value Ref Range   Sodium 134 (L) 135 - 145 mmol/L   Potassium 3.9 3.5 - 5.1 mmol/L  Chloride 102 98 - 111 mmol/L   CO2 23 22 - 32 mmol/L   Glucose, Bld 156 (H) 70 - 99 mg/dL   BUN 13 8 - 23 mg/dL   Creatinine, Ser 0.81 0.44 - 1.00 mg/dL   Calcium 8.9 8.9 - 10.3 mg/dL   GFR calc non Af Amer >60 >60 mL/min   GFR calc Af Amer >60 >60 mL/min   Anion gap 9 5 - 15   Recent Labs    10/22/18 0230  HGB 9.3*   Recent Labs    10/22/18 0230  WBC 10.2  RBC 3.14*  HCT 31.7*  PLT 418*   Recent Labs    10/22/18 0230  NA 134*  K 3.9  CL 102  CO2 23  BUN 13  CREATININE 0.81  GLUCOSE 156*  CALCIUM 8.9    Assessment/Plan: Right knee oblique periprosthetic fracture  The patient is being admitted to [facility] to undergo an open reduction internal fixation of the right femur. Surgery will be performed tomorrow by either Dr. Rod Can or Dr. Gaynelle Arabian. Risks and benefits have been discussed with the patient and they elect to proceed with the procedure.   Admission orders placed NPO at midnight Lovenox 40 mg today, hold tomorrow  Theresa Duty, PA-C Orthopedic Surgery [facility] [place]

## 2018-10-23 ENCOUNTER — Inpatient Hospital Stay (HOSPITAL_COMMUNITY): Payer: Medicare Other | Admitting: Certified Registered Nurse Anesthetist

## 2018-10-23 ENCOUNTER — Other Ambulatory Visit: Payer: Self-pay

## 2018-10-23 ENCOUNTER — Inpatient Hospital Stay (HOSPITAL_COMMUNITY): Payer: Medicare Other

## 2018-10-23 ENCOUNTER — Encounter (HOSPITAL_COMMUNITY)
Admission: EM | Disposition: A | Discharge status: Skilled Nursing Facility | Payer: Self-pay | Source: Home / Self Care | Attending: Orthopedic Surgery

## 2018-10-23 ENCOUNTER — Encounter (HOSPITAL_COMMUNITY): Payer: Self-pay | Admitting: Certified Registered Nurse Anesthetist

## 2018-10-23 DIAGNOSIS — Z96659 Presence of unspecified artificial knee joint: Secondary | ICD-10-CM

## 2018-10-23 DIAGNOSIS — M978XXA Periprosthetic fracture around other internal prosthetic joint, initial encounter: Secondary | ICD-10-CM

## 2018-10-23 HISTORY — PX: ORIF PERIPROSTHETIC FRACTURE: SHX5034

## 2018-10-23 LAB — SURGICAL PCR SCREEN
MRSA, PCR: NEGATIVE
STAPHYLOCOCCUS AUREUS: NEGATIVE

## 2018-10-23 LAB — POCT I-STAT 4, (NA,K, GLUC, HGB,HCT)
GLUCOSE: 155 mg/dL — AB (ref 70–99)
HEMATOCRIT: 23 % — AB (ref 36.0–46.0)
Hemoglobin: 7.8 g/dL — ABNORMAL LOW (ref 12.0–15.0)
Potassium: 3.9 mmol/L (ref 3.5–5.1)
Sodium: 137 mmol/L (ref 135–145)

## 2018-10-23 LAB — CBC
HCT: 26.5 % — ABNORMAL LOW (ref 36.0–46.0)
HEMOGLOBIN: 7.7 g/dL — AB (ref 12.0–15.0)
MCH: 29.8 pg (ref 26.0–34.0)
MCHC: 29.1 g/dL — AB (ref 30.0–36.0)
MCV: 102.7 fL — ABNORMAL HIGH (ref 80.0–100.0)
Platelets: 290 10*3/uL (ref 150–400)
RBC: 2.58 MIL/uL — ABNORMAL LOW (ref 3.87–5.11)
RDW: 15.9 % — ABNORMAL HIGH (ref 11.5–15.5)
WBC: 12.2 10*3/uL — AB (ref 4.0–10.5)
nRBC: 0 % (ref 0.0–0.2)

## 2018-10-23 LAB — GLUCOSE, CAPILLARY: GLUCOSE-CAPILLARY: 173 mg/dL — AB (ref 70–99)

## 2018-10-23 LAB — CREATININE, SERUM
Creatinine, Ser: 0.71 mg/dL (ref 0.44–1.00)
GFR calc Af Amer: >60 mL/min (ref >60)

## 2018-10-23 LAB — PREPARE RBC (CROSSMATCH)

## 2018-10-23 SURGERY — OPEN REDUCTION INTERNAL FIXATION (ORIF) PERIPROSTHETIC FRACTURE
Anesthesia: General | Laterality: Right

## 2018-10-23 MED ORDER — EPHEDRINE SULFATE-NACL 50-0.9 MG/10ML-% IV SOSY
PREFILLED_SYRINGE | INTRAVENOUS | Status: DC | PRN
  Administered 2018-10-23 (×2): 5 mg via INTRAVENOUS
  Administered 2018-10-23: 10 mg via INTRAVENOUS
  Administered 2018-10-23 (×3): 5 mg via INTRAVENOUS

## 2018-10-23 MED ORDER — DEXAMETHASONE SODIUM PHOSPHATE 10 MG/ML IJ SOLN
INTRAMUSCULAR | Status: AC
  Filled 2018-10-23: qty 1

## 2018-10-23 MED ORDER — ONDANSETRON HCL 4 MG/2ML IJ SOLN
INTRAMUSCULAR | Status: AC
  Filled 2018-10-23: qty 2

## 2018-10-23 MED ORDER — FENTANYL CITRATE (PF) 250 MCG/5ML IJ SOLN
INTRAMUSCULAR | Status: AC
  Filled 2018-10-23: qty 5

## 2018-10-23 MED ORDER — SUGAMMADEX SODIUM 200 MG/2ML IV SOLN
INTRAVENOUS | Status: AC
  Filled 2018-10-23: qty 2

## 2018-10-23 MED ORDER — PROMETHAZINE HCL 25 MG/ML IJ SOLN: 6.2500 mg | INTRAMUSCULAR | Status: DC | PRN

## 2018-10-23 MED ORDER — HYDROMORPHONE HCL 1 MG/ML IJ SOLN
0.2500 mg | INTRAMUSCULAR | Status: DC | PRN
  Administered 2018-10-23 (×4): 0.5 mg via INTRAVENOUS

## 2018-10-23 MED ORDER — SODIUM CHLORIDE 0.9 % IR SOLN
Status: DC | PRN
  Administered 2018-10-23: 3000 mL

## 2018-10-23 MED ORDER — METOCLOPRAMIDE HCL 5 MG PO TABS: 5.0000 mg | ORAL_TABLET | Freq: Three times a day (TID) | ORAL | Status: DC | PRN

## 2018-10-23 MED ORDER — METOCLOPRAMIDE HCL 5 MG/ML IJ SOLN: 5.0000 mg | Freq: Three times a day (TID) | INTRAMUSCULAR | Status: DC | PRN

## 2018-10-23 MED ORDER — SUGAMMADEX SODIUM 200 MG/2ML IV SOLN
INTRAVENOUS | Status: DC | PRN
  Administered 2018-10-23: 200 mg via INTRAVENOUS

## 2018-10-23 MED ORDER — ACETAMINOPHEN 10 MG/ML IV SOLN
1000.0000 mg | Freq: Once | INTRAVENOUS | Status: DC | PRN
  Administered 2018-10-23: 1000 mg via INTRAVENOUS

## 2018-10-23 MED ORDER — ROCURONIUM BROMIDE 10 MG/ML (PF) SYRINGE
PREFILLED_SYRINGE | INTRAVENOUS | Status: AC
  Filled 2018-10-23: qty 10

## 2018-10-23 MED ORDER — PHENOL 1.4 % MT LIQD: 1.0000 | OROMUCOSAL | Status: DC | PRN

## 2018-10-23 MED ORDER — ACETAMINOPHEN 325 MG PO TABS
325.0000 mg | ORAL_TABLET | Freq: Four times a day (QID) | ORAL | Status: DC | PRN
  Administered 2018-10-26: 325 mg via ORAL
  Filled 2018-10-23: qty 2

## 2018-10-23 MED ORDER — CLINDAMYCIN PHOSPHATE 900 MG/50ML IV SOLN
900.0000 mg | INTRAVENOUS | Status: AC
  Administered 2018-10-23: 900 mg via INTRAVENOUS
  Filled 2018-10-23: qty 50

## 2018-10-23 MED ORDER — ONDANSETRON HCL 4 MG/2ML IJ SOLN
INTRAMUSCULAR | Status: DC | PRN
  Administered 2018-10-23: 4 mg via INTRAVENOUS

## 2018-10-23 MED ORDER — ENOXAPARIN SODIUM 40 MG/0.4ML ~~LOC~~ SOLN
40.0000 mg | SUBCUTANEOUS | Status: DC
  Administered 2018-10-24 – 2018-10-26 (×3): 40 mg via SUBCUTANEOUS
  Filled 2018-10-23 (×3): qty 0.4

## 2018-10-23 MED ORDER — HYDROCODONE-ACETAMINOPHEN 7.5-325 MG PO TABS
1.0000 | ORAL_TABLET | ORAL | Status: DC | PRN
  Administered 2018-10-24 (×2): 1 via ORAL
  Administered 2018-10-26: 2 via ORAL
  Filled 2018-10-23: qty 2
  Filled 2018-10-23 (×2): qty 1

## 2018-10-23 MED ORDER — LIDOCAINE 2% (20 MG/ML) 5 ML SYRINGE
INTRAMUSCULAR | Status: DC | PRN
  Administered 2018-10-23: 100 mg via INTRAVENOUS

## 2018-10-23 MED ORDER — PHENYLEPHRINE 40 MCG/ML (10ML) SYRINGE FOR IV PUSH (FOR BLOOD PRESSURE SUPPORT)
PREFILLED_SYRINGE | INTRAVENOUS | Status: DC | PRN
  Administered 2018-10-23: 80 ug via INTRAVENOUS

## 2018-10-23 MED ORDER — ROCURONIUM BROMIDE 10 MG/ML (PF) SYRINGE
PREFILLED_SYRINGE | INTRAVENOUS | Status: DC | PRN
  Administered 2018-10-23: 60 mg via INTRAVENOUS

## 2018-10-23 MED ORDER — 0.9 % SODIUM CHLORIDE (POUR BTL) OPTIME
TOPICAL | Status: DC | PRN
  Administered 2018-10-23: 1000 mL

## 2018-10-23 MED ORDER — HYDROMORPHONE HCL 1 MG/ML IJ SOLN
INTRAMUSCULAR | Status: AC
  Filled 2018-10-23: qty 1

## 2018-10-23 MED ORDER — PHENYLEPHRINE 40 MCG/ML (10ML) SYRINGE FOR IV PUSH (FOR BLOOD PRESSURE SUPPORT)
PREFILLED_SYRINGE | INTRAVENOUS | Status: AC
  Filled 2018-10-23: qty 20

## 2018-10-23 MED ORDER — PROPOFOL 10 MG/ML IV BOLUS
INTRAVENOUS | Status: DC | PRN
  Administered 2018-10-23: 140 mg via INTRAVENOUS

## 2018-10-23 MED ORDER — LACTATED RINGERS IV SOLN
INTRAVENOUS | Status: DC
  Administered 2018-10-23 (×5): via INTRAVENOUS

## 2018-10-23 MED ORDER — LIDOCAINE 2% (20 MG/ML) 5 ML SYRINGE
INTRAMUSCULAR | Status: AC
  Filled 2018-10-23: qty 5

## 2018-10-23 MED ORDER — SUCCINYLCHOLINE CHLORIDE 200 MG/10ML IV SOSY
PREFILLED_SYRINGE | INTRAVENOUS | Status: AC
  Filled 2018-10-23: qty 10

## 2018-10-23 MED ORDER — CHLORHEXIDINE GLUCONATE 4 % EX LIQD
60.0000 mL | Freq: Once | CUTANEOUS | Status: AC
  Administered 2018-10-23: 4 via TOPICAL
  Filled 2018-10-23: qty 60

## 2018-10-23 MED ORDER — MORPHINE SULFATE (PF) 2 MG/ML IV SOLN: 0.5000 mg | INTRAVENOUS | Status: DC | PRN

## 2018-10-23 MED ORDER — EPHEDRINE 5 MG/ML INJ
INTRAVENOUS | Status: AC
  Filled 2018-10-23: qty 10

## 2018-10-23 MED ORDER — OXYCODONE HCL 5 MG PO TABS: 5.0000 mg | ORAL_TABLET | Freq: Once | ORAL | Status: DC | PRN

## 2018-10-23 MED ORDER — POVIDONE-IODINE 10 % EX SWAB: 2.0000 "application " | Freq: Once | CUTANEOUS | Status: DC

## 2018-10-23 MED ORDER — CLINDAMYCIN PHOSPHATE 600 MG/50ML IV SOLN
600.0000 mg | Freq: Four times a day (QID) | INTRAVENOUS | Status: AC
  Administered 2018-10-23 – 2018-10-24 (×2): 600 mg via INTRAVENOUS
  Filled 2018-10-23 (×2): qty 50

## 2018-10-23 MED ORDER — SODIUM CHLORIDE 0.9% IV SOLUTION: Freq: Once | INTRAVENOUS | Status: DC

## 2018-10-23 MED ORDER — SENNA 8.6 MG PO TABS
1.0000 | ORAL_TABLET | Freq: Two times a day (BID) | ORAL | Status: DC
  Administered 2018-10-23 – 2018-10-25 (×4): 8.6 mg via ORAL
  Filled 2018-10-23 (×5): qty 1

## 2018-10-23 MED ORDER — HYDROCODONE-ACETAMINOPHEN 5-325 MG PO TABS
1.0000 | ORAL_TABLET | ORAL | Status: DC | PRN
  Administered 2018-10-24: 2 via ORAL
  Administered 2018-10-24: 1 via ORAL
  Administered 2018-10-24 – 2018-10-26 (×8): 2 via ORAL
  Filled 2018-10-23: qty 2
  Filled 2018-10-23 (×2): qty 1
  Filled 2018-10-23 (×5): qty 2
  Filled 2018-10-23: qty 1
  Filled 2018-10-23 (×2): qty 2
  Filled 2018-10-23: qty 1

## 2018-10-23 MED ORDER — LACTATED RINGERS IV SOLN: INTRAVENOUS | Status: DC

## 2018-10-23 MED ORDER — ACETAMINOPHEN 10 MG/ML IV SOLN
INTRAVENOUS | Status: AC
  Filled 2018-10-23: qty 100

## 2018-10-23 MED ORDER — LABETALOL HCL 5 MG/ML IV SOLN
INTRAVENOUS | Status: AC
  Filled 2018-10-23: qty 4

## 2018-10-23 MED ORDER — OXYCODONE HCL 5 MG/5ML PO SOLN
5.0000 mg | Freq: Once | ORAL | Status: DC | PRN
  Filled 2018-10-23: qty 5

## 2018-10-23 MED ORDER — DOCUSATE SODIUM 100 MG PO CAPS
100.0000 mg | ORAL_CAPSULE | Freq: Two times a day (BID) | ORAL | Status: DC
  Administered 2018-10-23 – 2018-10-25 (×4): 100 mg via ORAL
  Filled 2018-10-23 (×5): qty 1

## 2018-10-23 MED ORDER — FENTANYL CITRATE (PF) 250 MCG/5ML IJ SOLN
INTRAMUSCULAR | Status: DC | PRN
  Administered 2018-10-23 (×2): 50 ug via INTRAVENOUS
  Administered 2018-10-23: 100 ug via INTRAVENOUS
  Administered 2018-10-23 (×6): 50 ug via INTRAVENOUS

## 2018-10-23 MED ORDER — ALBUTEROL SULFATE HFA 108 (90 BASE) MCG/ACT IN AERS
INHALATION_SPRAY | RESPIRATORY_TRACT | Status: DC | PRN
  Administered 2018-10-23 (×3): 2 via RESPIRATORY_TRACT

## 2018-10-23 MED ORDER — STERILE WATER FOR IRRIGATION IR SOLN
Status: DC | PRN
  Administered 2018-10-23: 2000 mL

## 2018-10-23 MED ORDER — ALBUTEROL SULFATE HFA 108 (90 BASE) MCG/ACT IN AERS
INHALATION_SPRAY | RESPIRATORY_TRACT | Status: AC
  Filled 2018-10-23: qty 6.7

## 2018-10-23 MED ORDER — DEXAMETHASONE SODIUM PHOSPHATE 10 MG/ML IJ SOLN
INTRAMUSCULAR | Status: DC | PRN
  Administered 2018-10-23: 10 mg via INTRAVENOUS

## 2018-10-23 MED ORDER — LABETALOL HCL 5 MG/ML IV SOLN
2.5000 mg | Freq: Once | INTRAVENOUS | Status: AC
  Administered 2018-10-23: 2.5 mg via INTRAVENOUS

## 2018-10-23 MED ORDER — MENTHOL 3 MG MT LOZG: 1.0000 | LOZENGE | OROMUCOSAL | Status: DC | PRN

## 2018-10-23 SURGICAL SUPPLY — 78 items
ADH SKN CLS APL DERMABOND .7 (GAUZE/BANDAGES/DRESSINGS) ×1
BAG SPEC THK2 15X12 ZIP CLS (MISCELLANEOUS) ×1
BAG ZIPLOCK 12X15 (MISCELLANEOUS) ×2 IMPLANT
BIT DRILL 4.3 (BIT) ×2
BIT DRILL 4.3X300MM (BIT) IMPLANT
BIT DRILL LONG 3.3 (BIT) ×2 IMPLANT
BIT DRILL QC 3.3X195 (BIT) ×1 IMPLANT
BLADE SAW SGTL 11.0X1.19X90.0M (BLADE) IMPLANT
CAP LOCK NCB (Cap) ×5 IMPLANT
COVER SURGICAL LIGHT HANDLE (MISCELLANEOUS) ×2 IMPLANT
COVER WAND RF STERILE (DRAPES) ×1 IMPLANT
DERMABOND ADVANCED (GAUZE/BANDAGES/DRESSINGS) ×1
DERMABOND ADVANCED .7 DNX12 (GAUZE/BANDAGES/DRESSINGS) IMPLANT
DRAPE INCISE IOBAN 66X45 STRL (DRAPES) ×2 IMPLANT
DRAPE ORTHO SPLIT 77X108 STRL (DRAPES) ×4
DRAPE POUCH INSTRU U-SHP 10X18 (DRAPES) ×2 IMPLANT
DRAPE SURG 17X11 SM STRL (DRAPES) ×2 IMPLANT
DRAPE SURG ORHT 6 SPLT 77X108 (DRAPES) ×2 IMPLANT
DRAPE U-SHAPE 47X51 STRL (DRAPES) ×2 IMPLANT
DRESSING AQUACEL AG SP 3.5X10 (GAUZE/BANDAGES/DRESSINGS) IMPLANT
DRESSING AQUACEL AG SP 3.5X4 (GAUZE/BANDAGES/DRESSINGS) IMPLANT
DRSG AQUACEL AG ADV 3.5X 6 (GAUZE/BANDAGES/DRESSINGS) ×1 IMPLANT
DRSG AQUACEL AG SP 3.5X10 (GAUZE/BANDAGES/DRESSINGS) ×4
DRSG AQUACEL AG SP 3.5X4 (GAUZE/BANDAGES/DRESSINGS) ×2
DRSG EMULSION OIL 3X16 NADH (GAUZE/BANDAGES/DRESSINGS) ×2 IMPLANT
DRSG PAD ABDOMINAL 8X10 ST (GAUZE/BANDAGES/DRESSINGS) ×4 IMPLANT
DURAPREP 26ML APPLICATOR (WOUND CARE) ×2 IMPLANT
ELECT BLADE TIP CTD 4 INCH (ELECTRODE) ×2 IMPLANT
ELECT REM PT RETURN 15FT ADLT (MISCELLANEOUS) ×2 IMPLANT
EVACUATOR 1/8 PVC DRAIN (DRAIN) ×2 IMPLANT
FACESHIELD WRAPAROUND (MASK) ×8 IMPLANT
FACESHIELD WRAPAROUND OR TEAM (MASK) ×4 IMPLANT
GAUZE SPONGE 4X4 12PLY STRL (GAUZE/BANDAGES/DRESSINGS) ×4 IMPLANT
GLOVE BIOGEL M 7.0 STRL (GLOVE) IMPLANT
GLOVE BIOGEL PI IND STRL 7.5 (GLOVE) ×1 IMPLANT
GLOVE BIOGEL PI IND STRL 8.5 (GLOVE) ×1 IMPLANT
GLOVE BIOGEL PI INDICATOR 7.5 (GLOVE) ×1
GLOVE BIOGEL PI INDICATOR 8.5 (GLOVE) ×1
GLOVE ECLIPSE 8.0 STRL XLNG CF (GLOVE) ×1 IMPLANT
GLOVE ORTHO TXT STRL SZ7.5 (GLOVE) ×3 IMPLANT
GLOVE SURG ORTHO 8.0 STRL STRW (GLOVE) ×3 IMPLANT
GOWN STRL REUS W/TWL LRG LVL3 (GOWN DISPOSABLE) ×2 IMPLANT
GOWN STRL REUS W/TWL XL LVL3 (GOWN DISPOSABLE) ×4 IMPLANT
IMMOBILIZER KNEE 20 (SOFTGOODS)
IMMOBILIZER KNEE 20 THIGH 36 (SOFTGOODS) IMPLANT
K-WIRE 2.0 (WIRE) ×4
K-WIRE FXSTD 280X2XNS SS (WIRE) ×2
KIT BASIN OR (CUSTOM PROCEDURE TRAY) ×2 IMPLANT
KWIRE FXSTD 280X2XNS SS (WIRE) IMPLANT
MANIFOLD NEPTUNE II (INSTRUMENTS) ×2 IMPLANT
NS IRRIG 1000ML POUR BTL (IV SOLUTION) ×4 IMPLANT
PACK TOTAL JOINT (CUSTOM PROCEDURE TRAY) ×2 IMPLANT
PASSER SUT SWANSON 36MM LOOP (INSTRUMENTS) IMPLANT
PLATE DISTAL FEMUR 15H 317M RT (Plate) ×1 IMPLANT
POSITIONER SURGICAL ARM (MISCELLANEOUS) ×2 IMPLANT
SCREW 5.0 32MM (Screw) ×1 IMPLANT
SCREW 5.0 80MM (Screw) ×1 IMPLANT
SCREW CORTICAL NCB 5.0X65 (Screw) ×1 IMPLANT
SCREW NCB 3.5X75X5X6.2XST (Screw) IMPLANT
SCREW NCB 4.0X36MM (Screw) ×1 IMPLANT
SCREW NCB 5.0X34MM (Screw) ×2 IMPLANT
SCREW NCB 5.0X75MM (Screw) ×6 IMPLANT
SPONGE LAP 18X18 RF (DISPOSABLE) ×2 IMPLANT
SPONGE LAP 4X18 RFD (DISPOSABLE) ×2 IMPLANT
STAPLER VISISTAT 35W (STAPLE) ×4 IMPLANT
STRIP CLOSURE SKIN 1/2X4 (GAUZE/BANDAGES/DRESSINGS) IMPLANT
SUCTION FRAZIER HANDLE 10FR (MISCELLANEOUS) ×1
SUCTION TUBE FRAZIER 10FR DISP (MISCELLANEOUS) ×1 IMPLANT
SUT ETHIBOND NAB CT1 #1 30IN (SUTURE) IMPLANT
SUT MNCRL AB 4-0 PS2 18 (SUTURE) IMPLANT
SUT MON AB 2-0 CT1 36 (SUTURE) ×1 IMPLANT
SUT VIC AB 1 CT1 36 (SUTURE) ×7 IMPLANT
SUT VIC AB 2-0 CT1 27 (SUTURE) ×8
SUT VIC AB 2-0 CT1 TAPERPNT 27 (SUTURE) ×3 IMPLANT
TAPE STRIPS DRAPE STRL (GAUZE/BANDAGES/DRESSINGS) ×1 IMPLANT
TOWEL OR 17X26 10 PK STRL BLUE (TOWEL DISPOSABLE) ×4 IMPLANT
TRAY FOLEY MTR SLVR 16FR STAT (SET/KITS/TRAYS/PACK) ×2 IMPLANT
WATER STERILE IRR 1000ML POUR (IV SOLUTION) ×2 IMPLANT

## 2018-10-23 NOTE — Anesthesia Preprocedure Evaluation (Addendum)
Anesthesia Evaluation  Patient identified by MRN, date of birth, ID band Patient awake    Reviewed: Allergy & Precautions, NPO status , Patient's Chart, lab work & pertinent test results  History of Anesthesia Complications Negative for: history of anesthetic complications  Airway Mallampati: III  TM Distance: >3 FB Neck ROM: Full    Dental  (+) Missing Some lower molars missing:   Pulmonary asthma , sleep apnea and Continuous Positive Airway Pressure Ventilation , COPD,    Pulmonary exam normal breath sounds clear to auscultation       Cardiovascular hypertension, + CAD  Normal cardiovascular exam Rhythm:Regular Rate:Normal     Neuro/Psych PSYCHIATRIC DISORDERS Anxiety Depression negative neurological ROS     GI/Hepatic Neg liver ROS, hiatal hernia, GERD  ,gastroparesis   Endo/Other  diabetes, Type 2Hypothyroidism   Renal/GU negative Renal ROS  negative genitourinary   Musculoskeletal  (+) Arthritis , Osteoarthritis,    Abdominal   Peds  Hematology  (+) anemia , Hgb 9.3 on 10/22/18   Anesthesia Other Findings   Reproductive/Obstetrics                            Lab Results  Component Value Date   WBC 10.2 10/22/2018   HGB 9.3 (L) 10/22/2018   HCT 31.7 (L) 10/22/2018   MCV 101.0 (H) 10/22/2018   PLT 418 (H) 10/22/2018   Lab Results  Component Value Date   CREATININE 0.81 10/22/2018   BUN 13 10/22/2018   NA 134 (L) 10/22/2018   K 3.9 10/22/2018   CL 102 10/22/2018   CO2 23 10/22/2018    Anesthesia Physical  Anesthesia Plan  ASA: III  Anesthesia Plan: General   Post-op Pain Management:    Induction: Intravenous  PONV Risk Score and Plan: 3 and Treatment may vary due to age or medical condition, Ondansetron and Dexamethasone  Airway Management Planned: Oral ETT  Additional Equipment:   Intra-op Plan:   Post-operative Plan: Extubation in OR  Informed Consent: I  have reviewed the patients History and Physical, chart, labs and discussed the procedure including the risks, benefits and alternatives for the proposed anesthesia with the patient or authorized representative who has indicated his/her understanding and acceptance.   Dental advisory given  Plan Discussed with: CRNA  Anesthesia Plan Comments:         Anesthesia Quick Evaluation

## 2018-10-23 NOTE — Anesthesia Postprocedure Evaluation (Signed)
Anesthesia Post Note  Patient: Alexis Rios  Procedure(s) Performed: OPEN REDUCTION INTERNAL FIXATION (ORIF) RIGHT  PERIPROSTHETIC FRACTURE (Right )     Patient location during evaluation: PACU Anesthesia Type: General Level of consciousness: awake and alert Pain management: pain level controlled Vital Signs Assessment: post-procedure vital signs reviewed and stable Respiratory status: spontaneous breathing, nonlabored ventilation, respiratory function stable and patient connected to nasal cannula oxygen Cardiovascular status: blood pressure returned to baseline and stable Postop Assessment: no apparent nausea or vomiting Anesthetic complications: no    Last Vitals:  Vitals:   10/23/18 1830 10/23/18 1857  BP: (!) 103/53 (!) 99/56  Pulse: 91 (!) 101  Resp: 15 14  Temp: 36.7 C 36.7 C  SpO2: 92% 99%    Last Pain:  Vitals:   10/23/18 1857  TempSrc: Oral  PainSc: 0-No pain                 Audry Pili

## 2018-10-23 NOTE — Interval H&P Note (Signed)
History and Physical Interval Note:  10/23/2018 11:29 AM  Alexis Rios  has presented today for surgery, with the diagnosis of right femur fx  The various methods of treatment have been discussed with the patient and family. After consideration of risks, benefits and other options for treatment, the patient has consented to  Procedure(s): OPEN REDUCTION INTERNAL FIXATION (ORIF) RIGHT  PERIPROSTHETIC FRACTURE (Right) as a surgical intervention .  The patient's history has been reviewed, patient examined, no change in status, stable for surgery.  I have reviewed the patient's chart and labs.  Questions were answered to the patient's satisfaction.    The risks, benefits, and alternatives were discussed with the patient. There are risks associated with the surgery including, but not limited to, problems with anesthesia (death), infection, differences in leg length/angulation/rotation, fracture of bones, loosening or failure of implants, malunion, nonunion, hematoma (blood accumulation) which may require surgical drainage, blood clots, pulmonary embolism, nerve injury (foot drop), and blood vessel injury. The patient understands these risks and elects to proceed.    Hilton Cork [place] Ponder

## 2018-10-23 NOTE — Discharge Instructions (Signed)
Dr. Rod Can Adult Hip & Knee Specialist [facility] [address] [place], [place] [address] [phone]   POSTOPERATIVE DIRECTIONS    Rehabilitation, Guidelines Following Surgery   WEIGHT BEARING Partial weight bearing with assist device as directed.  touch down weight bearing   HOME CARE INSTRUCTIONS  Remove items at home which could result in a fall. This includes throw rugs or furniture in walking pathways.  Continue medications as instructed at time of discharge.  You may have some home medications which will be placed on hold until you complete the course of blood thinner medication.  4 days after discharge, you may start showering. No tub baths or soaking your incisions. Do not put on socks or shoes without following the instructions of your caregivers.   Sit on chairs with arms. Use the chair arms to help push yourself up when arising.  Arrange for the use of a toilet seat elevator so you are not sitting low.   Walk with walker as instructed.  You may resume a sexual relationship in one month or when given the OK by your caregiver.  Use walker as long as suggested by your caregivers.  Avoid periods of inactivity such as sitting longer than an hour when not asleep. This helps prevent blood clots.  You may return to work once you are cleared by Engineer, production.  Do not drive a car for 6 weeks or until released by your surgeon.  Do not drive while taking narcotics.  Wear elastic stockings for two weeks following surgery during the day but you may remove then at night.  Make sure you keep all of your appointments after your operation with all of your doctors and caregivers. You should call the office at the above phone number and make an appointment for approximately two weeks after the date of your surgery. Please pick up a stool softener and laxative for home use as long as you are requiring pain medications.  ICE to the affected hip  every three hours for 30 minutes at a time and then as needed for pain and swelling. Continue to use ice on the hip for pain and swelling from surgery. You may notice swelling that will progress down to the foot and ankle.  This is normal after surgery.  Elevate the leg when you are not up walking on it.   It is important for you to complete the blood thinner medication as prescribed by your doctor.  Continue to use the breathing machine which will help keep your temperature down.  It is common for your temperature to cycle up and down following surgery, especially at night when you are not up moving around and exerting yourself.  The breathing machine keeps your lungs expanded and your temperature down.  RANGE OF MOTION AND STRENGTHENING EXERCISES  These exercises are designed to help you keep full movement of your hip joint. Follow your caregiver's or physical therapist's instructions. Perform all exercises about fifteen times, three times per day or as directed. Exercise both hips, even if you have had only one joint replacement. These exercises can be done on a training (exercise) mat, on the floor, on a table or on a bed. Use whatever works the best and is most comfortable for you. Use music or television while you are exercising so that the exercises are a pleasant break in your day. This will make your life better with the exercises acting as a break in routine you can  look forward to.  °Lying on your back, slowly slide your foot toward your buttocks, raising your knee up off the floor. Then slowly slide your foot back down until your leg is straight again.  °Lying on your back spread your legs as far apart as you can without causing discomfort.  °Lying on your side, raise your upper leg and foot straight up from the floor as far as is comfortable. Slowly lower the leg and repeat.  °Lying on your back, tighten up the muscle in the front of your thigh (quadriceps muscles). You can do this by keeping your  leg straight and trying to raise your heel off the floor. This helps strengthen the largest muscle supporting your knee.  °Lying on your back, tighten up the muscles of your buttocks both with the legs straight and with the knee bent at a comfortable angle while keeping your heel on the floor.  ° °SKILLED REHAB INSTRUCTIONS: °If the patient is transferred to a skilled rehab facility following release from the hospital, a list of the current medications will be sent to the facility for the patient to continue.  When discharged from the skilled rehab facility, please have the facility set up the patient's Home Health Physical Therapy prior to being released. Also, the skilled facility will be responsible for providing the patient with their medications at time of release from the facility to include their pain medication and their blood thinner medication. If the patient is still at the rehab facility at time of the two week follow up appointment, the skilled rehab facility will also need to assist the patient in arranging follow up appointment in our office and any transportation needs. ° °MAKE SURE YOU:  °Understand these instructions.  °Will watch your condition.  °Will get help right away if you are not doing well or get worse. ° °Pick up stool softner and laxative for home use following surgery while on pain medications. °Daily dry dressing changes as needed. °In 4 days, you may remove your dressings and begin taking showers - no tub baths or soaking the incisions. °Continue to use ice for pain and swelling after surgery. °Do not use any lotions or creams on the incision until instructed by your surgeon. ° ° °

## 2018-10-23 NOTE — Op Note (Signed)
OPERATIVE REPORT   10/23/2018  4:37 PM  PATIENT:  Alexis Rios   SURGEON:  Bertram Savin, MD  ASSISTANT:  Nehemiah Massed, PA-C.   PREOPERATIVE DIAGNOSIS: Periprosthetic right distal femur fracture.  POSTOPERATIVE DIAGNOSIS:  Same.  PROCEDURE: Open reduction internal fixation right femur.  ANESTHESIA:   GETA.  ANTIBIOTICS: 900 mg clindamycin.  IMPLANTS: Zimmer NCB periprosthetic distal femur locking plate. 5.0 mm bicortical distal screw x5 with locking caps. 5.0 mm proximal bicortical screw x3. 4.0 mm proximal bicortical screw x1.  SPECIMENS: None.  COMPLICATIONS: None.  DISPOSITION: Stable to PACU.  SURGICAL INDICATIONS:  EMMER LILLIBRIDGE is a 72 y.o. female who underwent primary right total knee replacement 2 weeks ago.  She had increased pain after a particular physical therapy session.  Later on, while getting off of the commode, she experienced a pop and increasing pain in the right femur.  She was unable to weight-bear.  She was brought to the emergency department at [facility], where x-rays revealed a periprosthetic distal femur fracture, just proximal to the anterior flange.  She was indicated for operative fixation.  We discussed the risk, benefits, and alternatives, and she elected to proceed.  The risks, benefits, and alternatives were discussed with the patient preoperatively including but not limited to the risks of infection, bleeding, nerve / blood vessel injury, malunion, nonunion, cardiopulmonary complications, the need for repeat surgery, among others, and the patient was willing to proceed.  PROCEDURE IN DETAIL: The patient was identified in the holding area using 2 identifiers.  The surgical site was marked by myself.  She was taken the operating room, and general anesthesia was induced on her bed.  Foley catheter was placed.  She was then positioned supine on the operating room table.  All bony prominences were well-padded.  A bump was  placed under the right hip.  The right lower extremity was prepped and draped in the normal sterile surgical fashion.  Timeout was called, verifying site and site of surgery.  She did receive standard IV antibiotics within 60 minutes of beginning the procedure  I began by reducing the fracture with traction and rotation over a triangle.  I was unable to achieve an acceptable reduction in a closed fashion due to her body habitus.  I therefore made an incision directly over the lateral aspect of the distal femur.  Blunt dissection was performed until I reached the IT band.  I split the IT band in line with fibers.  Cobb elevator was used to lift the vastus lateralis anteriorly off of the septum.  I identified the fracture.  She did have muscle and periosteum interposed within the fracture site.  I debrided the fracture with a rongeur.  I then used a bone hook and manual traction in order to obtain a reduction of the fracture.  The reduction was extremely difficult due to her body habitus.  Once the fracture was reduced, I provisionally placed 2 K wires anterior from the anterior cortex from proximal to distal.  I then selected a 15 hole NCB periprosthetic plate which was mounted onto the jig.  I slid the plate submuscularly up the shaft of the femur.  I provisionally pinned the plate distally using a pin, and proximally through a separate stab incision using a drill bit.  The position of the plate and reduction of the fracture were checked with biplanar fluoroscopy.  I placed a total of 4 bicortical screws proximally through separate stab incisions.  I  placed a total of 5 bicortical screws distally.  Locking caps were added to the distal screws, creating a fixed angle construct.  The jig was removed.  The K wires were removed.  Final AP and lateral fluoroscopy views were used to confirm fracture reduction and hardware placement.  The wounds were copiously irrigated with saline using pulse lavage.  The IT band was  closed with #1 Vicryl interrupted sutures.  Deep fatty tissue was closed with 2-0 Vicryl, 2-0 Monocryl for the deep dermal layer, staples are glue for skin.  Once the glue was hardened, sterile dressing was applied.  The patient was extubated, taken the PACU in stable condition.  Sponge, needle, and instrument counts were correct at the end of the case x2.  There were no known complications.  POSTOPERATIVE PLAN: Patient will be readmitted to the hospital.  She will be touchdown weightbearing right lower extremity.  She will receive physical therapy for mobilization out of bed.  She may have unlimited range of motion to the right knee.  Lovenox for DVT prophylaxis.  Physical and Occupational Therapy.  Disposition planning.  Return to the office in 2 weeks for routine postoperative care.

## 2018-10-23 NOTE — Anesthesia Procedure Notes (Signed)
Procedure Name: Intubation Date/Time: 10/23/2018 12:48 PM Performed by: [place] Katalyn Matin C, CRNA Pre-anesthesia Checklist: Patient identified, Emergency Drugs available, Suction available and Patient being monitored Patient Re-evaluated:Patient Re-evaluated prior to induction Oxygen Delivery Method: Circle system utilized Preoxygenation: Pre-oxygenation with 100% oxygen Induction Type: IV induction Ventilation: Mask ventilation without difficulty Laryngoscope Size: Mac and 3 Grade View: Grade II Tube type: Oral Tube size: 7.0 mm Number of attempts: 1 Airway Equipment and Method: Stylet and Oral airway Placement Confirmation: ETT inserted through vocal cords under direct vision,  positive ETCO2 and breath sounds checked- equal and bilateral Tube secured with: Tape Dental Injury: Teeth and Oropharynx as per pre-operative assessment

## 2018-10-23 NOTE — Anesthesia Procedure Notes (Signed)
Date/Time: 10/23/2018 4:52 PM Performed by: Cynda Familia, CRNA Oxygen Delivery Method: Simple face mask Placement Confirmation: positive ETCO2 and breath sounds checked- equal and bilateral Dental Injury: Teeth and Oropharynx as per pre-operative assessment

## 2018-10-23 NOTE — Transfer of Care (Signed)
Immediate Anesthesia Transfer of Care Note  Patient: Alexis Rios [facility]  Procedure(s) Performed: OPEN REDUCTION INTERNAL FIXATION (ORIF) RIGHT  PERIPROSTHETIC FRACTURE (Right )  Patient Location: PACU  Anesthesia Type:General  Level of Consciousness: sedated  Airway & Oxygen Therapy: Patient Spontanous Breathing and Patient connected to face mask oxygen  Post-op Assessment: Report given to RN and Post -op Vital signs reviewed and stable  Post vital signs: Reviewed and stable  Last Vitals:  Vitals Value Taken Time  BP 136/66 10/23/2018  5:03 PM  Temp    Pulse 113 10/23/2018  5:05 PM  Resp 17 10/23/2018  5:05 PM  SpO2 100 % 10/23/2018  5:05 PM  Vitals shown include unvalidated device data.  Last Pain:  Vitals:   10/23/18 1113  TempSrc: Oral  PainSc:       Patients Stated Pain Goal: 3 (28/00/34 9179)  Complications: No apparent anesthesia complications

## 2018-10-24 LAB — CBC
HCT: 23.8 % — ABNORMAL LOW (ref 36.0–46.0)
Hemoglobin: 6.8 g/dL — CL (ref 12.0–15.0)
MCH: 29.4 pg (ref 26.0–34.0)
MCHC: 28.6 g/dL — ABNORMAL LOW (ref 30.0–36.0)
MCV: 103 fL — ABNORMAL HIGH (ref 80.0–100.0)
PLATELETS: 300 10*3/uL (ref 150–400)
RBC: 2.31 MIL/uL — AB (ref 3.87–5.11)
RDW: 15.7 % — AB (ref 11.5–15.5)
WBC: 9.1 10*3/uL (ref 4.0–10.5)
nRBC: 0 % (ref 0.0–0.2)

## 2018-10-24 LAB — BASIC METABOLIC PANEL
Anion gap: 8 (ref 5–15)
BUN: 8 mg/dL (ref 8–23)
CALCIUM: 7.7 mg/dL — AB (ref 8.9–10.3)
CO2: 25 mmol/L (ref 22–32)
CREATININE: 0.68 mg/dL (ref 0.44–1.00)
Chloride: 103 mmol/L (ref 98–111)
GFR calc Af Amer: >60 mL/min (ref >60)
Glucose, Bld: 142 mg/dL — ABNORMAL HIGH (ref 70–99)
POTASSIUM: 3.7 mmol/L (ref 3.5–5.1)
SODIUM: 136 mmol/L (ref 135–145)

## 2018-10-24 LAB — PREPARE RBC (CROSSMATCH)

## 2018-10-24 MED ORDER — DIPHENHYDRAMINE HCL 25 MG PO CAPS
25.0000 mg | ORAL_CAPSULE | Freq: Four times a day (QID) | ORAL | Status: DC | PRN
  Administered 2018-10-24: 25 mg via ORAL
  Filled 2018-10-24 (×2): qty 1

## 2018-10-24 MED ORDER — SODIUM CHLORIDE 0.9% IV SOLUTION: Freq: Once | INTRAVENOUS | Status: DC

## 2018-10-24 NOTE — Evaluation (Signed)
Physical Therapy Evaluation Patient Details Name: Alexis Rios MRN: [id number] DOB: [birth year] Today's Date: 10/24/2018   History of Present Illness  72 yo female adm with R periprosthetic femur fx now s/p ORIF peri-prosthetic femur fx per Dr Lyla Glassing; (R TKR on 10/10/18). PMH includes CAD, GERD, obesity, shoulder disorder, OSA, anxiety, carpal tunnel, depression, DM II, HTN, UTI. Surgical history includes back surgery, cholecystectomy, R knee and foot surgery, R toe amputation.  Clinical Impression  Pt admitted with above diagnosis. Pt currently with functional limitations due to the deficits listed below (see PT Problem List). Pt able to transfer bed to chair, fatigued easiliy but able to perform SPT and maintain WBing status; recommend SNF post acute; will continue to follow;  Pt will benefit from skilled PT to increase their independence and safety with mobility to allow discharge to the venue listed below.       Follow Up Recommendations SNF    Equipment Recommendations  None recommended by PT    Recommendations for Other Services       Precautions / Restrictions Precautions Precautions: Fall;Knee Precaution Comments: reviewed no pillow under knee Restrictions RLE Weight Bearing: Partial weight bearing RLE Partial Weight Bearing Percentage or Pounds: 30%      Mobility  Bed Mobility Overal bed mobility: Needs Assistance Bed Mobility: Supine to Sit     Supine to sit: Min assist     General bed mobility comments: incr time, bed rails, bed pad used to assist scooting  Transfers Overall transfer level: Needs assistance Equipment used: Rolling walker (2 wheeled) Transfers: Sit to/from [facility] Sit to Stand: Mod assist;+2 safety/equipment Stand pivot transfers: Min assist;+2 safety/equipment       General transfer comment: cues for 30% PWB, safety, technique ( pt able to perform SPT and maintain NWB)  Ambulation/Gait             General  Gait Details: NT  Stairs            Wheelchair Mobility    Modified Rankin (Stroke Patients Only)       Balance Overall balance assessment: Needs assistance           Standing balance-Leahy Scale: Poor                               Pertinent Vitals/Pain Pain Assessment: 0-10 Pain Score: 6  Pain Location: R knee  Pain Descriptors / Indicators: Sore;Aching Pain Intervention(s): Limited activity within patient's tolerance;Monitored during session;Premedicated before session    Home Living Family/patient expects to be discharged to:: Skilled nursing facility               Home Equipment: Gilford Rile - 2 wheels;Cane - single point;Bedside commode;Shower seat      Prior Function Level of Independence: Independent with assistive device(s)         Comments: amb with RW prior to injury     Hand Dominance        Extremity/Trunk Assessment   Upper Extremity Assessment Upper Extremity Assessment: Overall WFL for tasks assessed    Lower Extremity Assessment Lower Extremity Assessment: Generalized weakness;RLE deficits/detail RLE Deficits / Details: RLE 2 to 2+/5 RLE: Unable to fully assess due to pain       Communication   Communication: No difficulties  Cognition Arousal/Alertness: Awake/alert Behavior During Therapy: WFL for tasks assessed/performed Overall Cognitive Status: Within Functional Limits for tasks assessed  General Comments      Exercises     Assessment/Plan    PT Assessment Patient needs continued PT services  PT Problem List Decreased strength;Pain;Decreased range of motion;Decreased activity tolerance;Decreased knowledge of use of DME;Decreased balance;Decreased mobility       PT Treatment Interventions DME instruction;Therapeutic activities;Therapeutic exercise;Patient/family education;Stair training;Balance training;Functional mobility training;Gait training     PT Goals (Current goals can be found in the Care Plan section)  Acute Rehab PT Goals Patient Stated Goal: be able to walk PT Goal Formulation: With patient Time For Goal Achievement: 11/07/18 Potential to Achieve Goals: Good    Frequency 7X/week   Barriers to discharge        Co-evaluation               AM-PAC PT "6 Clicks" Daily Activity  Outcome Measure Difficulty turning over in bed (including adjusting bedclothes, sheets and blankets)?: Unable Difficulty moving from lying on back to sitting on the side of the bed? : Unable Difficulty sitting down on and standing up from a chair with arms (e.g., wheelchair, bedside commode, etc,.)?: Unable Help needed moving to and from a bed to chair (including a wheelchair)?: A Lot Help needed walking in hospital room?: Total Help needed climbing 3-5 steps with a railing? : Total 6 Click Score: 7    End of Session Equipment Utilized During Treatment: Gait belt Activity Tolerance: Patient tolerated treatment well Patient left: in chair;with call bell/phone within reach;with nursing/sitter in room   PT Visit Diagnosis: Unsteadiness on feet (R26.81);Muscle weakness (generalized) (M62.81)    Time: 7681-1572 PT Time Calculation (min) (ACUTE ONLY): 14 min   Charges:   PT Evaluation $PT Eval Low Complexity: 1 Low          Kenyon Ana, PT  Pager: [phone] Acute Rehab Dept [facility]): [phone]   10/24/2018   [facility] 10/24/2018, 1:39 PM

## 2018-10-24 NOTE — Progress Notes (Signed)
   10/24/18 1400  PT Visit Information  Last PT Received On 10/24/18 Pt is progressing, fatigues easily;continue PT POC  Assistance Needed +2 (safety/progression)  History of Present Illness 72 yo female adm with R periprosthetic femur fx now s/p ORIF peri-prosthetic femur fx per Dr Lyla Glassing; (R TKR on 10/10/18). PMH includes CAD, GERD, obesity, shoulder disorder, OSA, anxiety, carpal tunnel, depression, DM II, HTN, UTI. Surgical history includes back surgery, cholecystectomy, R knee and foot surgery, R toe amputation.  Subjective Data  Patient Stated Goal be able to walk  Precautions  Precautions Fall;Knee  Restrictions  RLE Weight Bearing PWB  RLE Partial Weight Bearing Percentage or Pounds 30%  Pain Assessment  Pain Assessment 0-10  Pain Score 5  Pain Location R knee   Pain Descriptors / Indicators Sore;Aching  Pain Intervention(s) Limited activity within patient's tolerance;Monitored during session;Repositioned  Cognition  Arousal/Alertness Awake/alert  Behavior During Therapy WFL for tasks assessed/performed  Overall Cognitive Status Within Functional Limits for tasks assessed  Bed Mobility  Overal bed mobility Needs Assistance  Bed Mobility Sit to Supine  Sit to supine Min assist  General bed mobility comments incr time, cues for technique adn self assist  Transfers  Overall transfer level Needs assistance  Equipment used Rolling walker (2 wheeled)  Transfers Sit to/from Stand  Sit to Stand Min assist  Stand pivot transfers Min assist;+2 safety/equipment  General transfer comment cues for 30% PWB, safety, technique ( pt able to perform SPT and maintain NWB)  Ambulation/Gait  General Gait Details 2 small steps back to bed, bed brought to pt d/t fatigue  Balance  Standing balance-Leahy Scale Poor  Total Joint Exercises  Ankle Circles/Pumps AROM;Both;5 reps  Quad Sets AROM;Right;5 reps  PT - End of Session  Equipment Utilized During Treatment Gait belt  Activity  Tolerance Patient tolerated treatment well  Patient left in bed;with call bell/phone within reach;with bed alarm set  Nurse Communication Mobility status   PT - Assessment/Plan  PT Plan Current plan remains appropriate  PT Visit Diagnosis Unsteadiness on feet (R26.81);Muscle weakness (generalized) (M62.81)  PT Frequency (ACUTE ONLY) Min 5X/week (recent TKA)  Follow Up Recommendations SNF  PT equipment None recommended by PT  AM-PAC PT "6 Clicks" Daily Activity Outcome Measure  Difficulty turning over in bed (including adjusting bedclothes, sheets and blankets)? 1  Difficulty moving from lying on back to sitting on the side of the bed?  1  Difficulty sitting down on and standing up from a chair with arms (e.g., wheelchair, bedside commode, etc,.)? 1  Help needed moving to and from a bed to chair (including a wheelchair)? 2  Help needed walking in hospital room? 2  Help needed climbing 3-5 steps with a railing?  2  6 Click Score 9  Mobility G Code  CL  PT Goal Progression  Progress towards PT goals Progressing toward goals  Acute Rehab PT Goals  PT Goal Formulation With patient  Time For Goal Achievement 11/07/18  Potential to Achieve Goals Good  PT Time Calculation  PT Start Time (ACUTE ONLY) 1038  PT Stop Time (ACUTE ONLY) 1052  PT Time Calculation (min) (ACUTE ONLY) 14 min  PT General Charges  $$ ACUTE PT VISIT 1 Visit  PT Treatments  $Therapeutic Activity 8-22 mins

## 2018-10-24 NOTE — Progress Notes (Signed)
CRITICAL VALUE ALERT  Critical Value:  Hgb 6.8  Date & Time Notied:  10/24/18 04:55  Provider Notified: Dr. Lyla Glassing  Orders Received/Actions taken: Dr. will put orders in to transfuse blood

## 2018-10-24 NOTE — Progress Notes (Signed)
OT Cancellation Note  Patient Details Name: Alexis Rios MRN: [id number] DOB: 30-Oct-1946   Cancelled Treatment:    Reason Eval/Treat Not Completed: Other (comment)  Pts hemoglobin 6.8. Pt will get 2 units of blood. Will check on pt later in day or next day.  Kari Baars, OT Acute Rehabilitation Services [phone] Office- [phone], Edwena Felty D 10/24/2018, 10:37 AM

## 2018-10-24 NOTE — Progress Notes (Signed)
    Subjective:  Patient reports pain as mild to moderate.  Denies N/V/CP/SOB. C/o itching with pain meds.  Objective:   VITALS:   Vitals:   10/24/18 0137 10/24/18 0530 10/24/18 0832 10/24/18 1008  BP: (!) 120/53 (!) 120/54  (!) 102/40  Pulse: 86 78  81  Resp: 16 16  18   Temp: 98.6 F (37 C) 97.9 F (36.6 C)  98.1 F (36.7 C)  TempSrc: Oral Oral  Oral  SpO2: 100% 100% 98% 100%  Weight:      Height:        NAD ABD soft Sensation intact distally Intact pulses distally Dorsiflexion/Plantar flexion intact Incision: dressing C/D/I Compartment soft   Lab Results  Component Value Date   WBC 9.1 10/24/2018   HGB 6.8 (LL) 10/24/2018   HCT 23.8 (L) 10/24/2018   MCV 103.0 (H) 10/24/2018   PLT 300 10/24/2018   BMET    Component Value Date/Time   NA 136 10/24/2018 0415   K 3.7 10/24/2018 0415   CL 103 10/24/2018 0415   CO2 25 10/24/2018 0415   GLUCOSE 142 (H) 10/24/2018 0415   BUN 8 10/24/2018 0415   CREATININE 0.68 10/24/2018 0415   CALCIUM 7.7 (L) 10/24/2018 0415   GFRNONAA >60 10/24/2018 0415   GFRAA >60 10/24/2018 0415     Assessment/Plan: 1 Day Post-Op   Principal Problem:   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 Active Problems:   Periprosthetic fracture of shaft of femur   Periprosthetic fracture around prosthetic knee   TDWB RLE with walker ROM R knee DVT ppx: Lovenox, SCDs, TEDS PO pain control PT/OT ABLA: transfuse 2 units PRBCs, recheck hgb in am Dispo: likely SNF placement   Hilton Cork Dayra Rapley 10/24/2018, 10:18 AM   Rod Can, MD Cell: [phone] [place] [facility] now [facility]  [place] [address], [address], [place], [place] [address] Phone: [phone] [email or web address] Facebook  [employer]

## 2018-10-25 LAB — TYPE AND SCREEN
ABO/RH(D): A NEG
Antibody Screen: NEGATIVE
UNIT DIVISION: 0
UNIT DIVISION: 0

## 2018-10-25 LAB — BASIC METABOLIC PANEL
ANION GAP: 7 (ref 5–15)
BUN: 11 mg/dL (ref 8–23)
CHLORIDE: 103 mmol/L (ref 98–111)
CO2: 27 mmol/L (ref 22–32)
Calcium: 7.4 mg/dL — ABNORMAL LOW (ref 8.9–10.3)
Creatinine, Ser: 0.65 mg/dL (ref 0.44–1.00)
GFR calc Af Amer: >60 mL/min (ref >60)
GLUCOSE: 167 mg/dL — AB (ref 70–99)
Potassium: 3.3 mmol/L — ABNORMAL LOW (ref 3.5–5.1)
Sodium: 137 mmol/L (ref 135–145)

## 2018-10-25 LAB — CBC
HEMATOCRIT: 28.6 % — AB (ref 36.0–46.0)
Hemoglobin: 8.9 g/dL — ABNORMAL LOW (ref 12.0–15.0)
MCH: 30.7 pg (ref 26.0–34.0)
MCHC: 31.1 g/dL (ref 30.0–36.0)
MCV: 98.6 fL (ref 80.0–100.0)
NRBC: 0 % (ref 0.0–0.2)
Platelets: 259 10*3/uL (ref 150–400)
RBC: 2.9 MIL/uL — AB (ref 3.87–5.11)
RDW: 16.2 % — ABNORMAL HIGH (ref 11.5–15.5)
WBC: 7.8 10*3/uL (ref 4.0–10.5)

## 2018-10-25 LAB — BPAM RBC
BLOOD PRODUCT EXPIRATION DATE: 201912132359
BLOOD PRODUCT EXPIRATION DATE: 201912132359
ISSUE DATE / TIME: 201911141211
ISSUE DATE / TIME: 201911141803
UNIT TYPE AND RH: 600
Unit Type and Rh: 600

## 2018-10-25 MED ORDER — POTASSIUM CHLORIDE CRYS ER 20 MEQ PO TBCR
40.0000 meq | EXTENDED_RELEASE_TABLET | Freq: Once | ORAL | Status: AC
  Administered 2018-10-25: 40 meq via ORAL
  Filled 2018-10-25: qty 2

## 2018-10-25 MED ORDER — ALUM & MAG HYDROXIDE-SIMETH 200-200-20 MG/5ML PO SUSP
30.0000 mL | Freq: Four times a day (QID) | ORAL | Status: DC | PRN
  Administered 2018-10-25 – 2018-10-26 (×2): 30 mL via ORAL
  Filled 2018-10-25 (×2): qty 30

## 2018-10-25 MED ORDER — ENOXAPARIN SODIUM 40 MG/0.4ML ~~LOC~~ SOLN: 40.0000 mg | SUBCUTANEOUS | 0 refills | Status: DC

## 2018-10-25 MED ORDER — HYDROCODONE-ACETAMINOPHEN 5-325 MG PO TABS: 1.0000 | ORAL_TABLET | ORAL | 0 refills | Status: DC | PRN

## 2018-10-25 MED ORDER — DIPHENHYDRAMINE HCL 25 MG PO CAPS: 25.0000 mg | ORAL_CAPSULE | Freq: Four times a day (QID) | ORAL | 0 refills | Status: AC | PRN

## 2018-10-25 NOTE — Discharge Summary (Addendum)
Physician Discharge Summary  Patient ID: Alexis Rios MRN: [id number] DOB/AGE: [birth year] 72 y.o.  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 Anemia   . Anxiety   . Arthritis   . Asthma   . CAD (coronary artery disease)    Cath 2009, AV groove CX 30%, RCA 20 %  . Candidiasis of skin and nail   . Carpal tunnel syndrome   . Chronic back pain   . Chronic headaches    migraines  . Complication of anesthesia    if no breathing tx before anesthesia wakes with asthma  . COPD (chronic obstructive pulmonary disease) ([place])   . Depression   . Diabetes mellitus    DIET CONTROLLED NOW  . Diverticulosis   . Gastroparesis    ?  Marland Kitchen GERD (gastroesophageal reflux disease)   . H/O echocardiogram    Echo 09/2011: Mild LVH, EF 84-69%, grade 1 diastolic dysfunction, trivial AI, mild MR, mild LAE.  . H/O exercise stress test    Dobutamine Myoview 09/2011: No scar or ischemia, no EKG changes, study not gated.  . HH (hiatus hernia)   . History of gallstones   . History of pneumonia   . Hypercholesteremia   . Hypertension   . IBS (irritable bowel syndrome)   . Osteoporosis   . Peripheral neuropathy    LEGS AND FEET  . Pneumonia LAST TIME 8 YRS AGO  . S/P cardiac catheterization    LHC 03/2008: Mid AV groove circumflex 30%, mid RCA 20%, EF 60%  . Seasonal allergies   . Skin cancer    scc/bcc  . Skin yeast infection 07/05/2015  . Sleep apnea    cpap   . Stones in the urinary tract   . Subclinical hypothyroidism   . UTI (urinary tract infection)     Surgeries: Procedure(s): OPEN REDUCTION INTERNAL FIXATION (ORIF) RIGHT   PERIPROSTHETIC FRACTURE on 10/23/2018   Consultants (if any):   Discharged Condition: Improved  Hospital Course: Alexis Rios is an 72 y.o. 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    She was given perioperative antibiotics:  Anti-infectives (From admission, onward)   Start     Dose/Rate Route Frequency Ordered Stop   10/23/18 1930  clindamycin (CLEOCIN) IVPB 600 mg     600 mg 100 mL/hr over 30 Minutes Intravenous Every 6 hours 10/23/18 1856 10/24/18 0255   10/23/18 1030  clindamycin (CLEOCIN) IVPB 900 mg     900 mg 100 mL/hr over 30 Minutes Intravenous On call to O.R. 10/23/18 0849 10/23/18 1256    .  Postoperatively, she was made touch down weight bearing right leg.  She required 2 units PRBCs for acute blood loss anemia. Hgb responded from 6.8 to 8.9.  She required repletion for hypokalemia.  She was given sequential compression devices, early ambulation, and lovenox for DVT prophylaxis.  She benefited maximally from the hospital stay and there were no complications.    Recent vital signs:  Vitals:   10/26/18 0627 10/26/18 0858  BP: 122/79  Pulse: 90   Resp: 16   Temp: 98.1 F (36.7 C)   SpO2: 94% 96%    Recent laboratory studies:  Lab Results  Component Value Date   HGB 9.0 (L) 10/26/2018   HGB 8.9 (L) 10/25/2018   HGB 6.8 (LL) 10/24/2018   Lab Results  Component Value Date   WBC 9.6 10/26/2018   PLT 279 10/26/2018   Lab Results  Component Value Date   INR 0.9 04/06/2008   Lab Results  Component Value Date   NA 137 10/25/2018   K 3.3 (L) 10/25/2018   CL 103 10/25/2018   CO2 27 10/25/2018   BUN 11 10/25/2018   CREATININE 0.65 10/25/2018   GLUCOSE 167 (H) 10/25/2018    Discharge Medications:   Allergies as of 10/26/2018      Reactions   Sulfa Antibiotics Swelling   Mouth  and throat   Codeine Itching   TAKES OK WITH BENADRYL   Cymbalta [duloxetine Hcl] Other (See Comments)   Cannot urinate   Keflex [cephalexin]    Macrodantin [nitrofurantoin Macrocrystal] Nausea And Vomiting   Mucinex [guaifenesin Er] Other (See Comments)   Urinary retention   Tape Rash      Medication List    STOP taking these medications   aspirin 81 MG chewable tablet   mometasone-formoterol 200-5 MCG/ACT Aero Commonly known as:  DULERA   ranitidine 300 MG tablet Commonly known as:  ZANTAC   sucralfate 1 g tablet Commonly known as:  CARAFATE     TAKE these medications   albuterol 108 (90 Base) MCG/ACT inhaler Commonly known as:  PROVENTIL HFA;VENTOLIN HFA Inhale 2 puffs into the lungs every 4 (four) hours as needed for wheezing or shortness of breath. What changed:  additional instructions   albuterol (2.5 MG/3ML) 0.083% nebulizer solution Commonly known as:  PROVENTIL Take 2.5 mg by nebulization every 6 (six) hours as needed for shortness of breath. What changed:  Another medication with the same name was changed. Make sure you understand how and when to take each.   ALPRAZolam 0.25 MG tablet Commonly known as:  XANAX Take 0.25 mg by mouth at bedtime.   citalopram 40 MG tablet Commonly known as:  CELEXA Take 40 mg by mouth at bedtime.   cyanocobalamin 1000 MCG/ML injection Commonly known as:  (VITAMIN B-12) Inject 1,000 mcg into the muscle every 30 (thirty) days.   diphenhydrAMINE 25 mg capsule Commonly known as:  BENADRYL Take 1 capsule (25 mg total) by mouth every 6 (six) hours as needed for itching.   docusate sodium 100 MG capsule Commonly known as:  COLACE Take 1 capsule (100 mg total) by mouth 2 (two) times daily.   enoxaparin 40 MG/0.4ML injection Commonly known as:  LOVENOX Inject 0.4 mLs (40 mg total) into the skin daily.   gabapentin 400 MG capsule Commonly known as:  NEURONTIN Take 400 mg by mouth at bedtime.   HYDROcodone-acetaminophen  5-325 MG tablet Commonly known as:  NORCO/VICODIN Take 1-2 tablets by mouth every 4 (four) hours as needed for moderate pain. What changed:  when to take this   mirabegron ER 25 MG Tb24 tablet Commonly known as:  MYRBETRIQ Take 25 mg by mouth at bedtime.   omeprazole 40 MG capsule Commonly known as:  PRILOSEC Take one capsule every morning as directed. What changed:    how much to take  how to take this  when to take this  reasons to take this   ondansetron 4 MG  tablet Commonly known as:  ZOFRAN Take 1 tablet (4 mg total) by mouth every 6 (six) hours as needed for nausea.   rosuvastatin 5 MG tablet Commonly known as:  CRESTOR Take 5 mg by mouth every Sunday.   senna 8.6 MG Tabs tablet Commonly known as:  SENOKOT Take 1 tablet (8.6 mg total) by mouth 2 (two) times daily.   SYMBICORT 80-4.5 MCG/ACT inhaler Generic drug:  budesonide-formoterol Inhale 2 puffs into the lungs at bedtime.   topiramate 100 MG tablet Commonly known as:  TOPAMAX Take 100 mg by mouth at bedtime.   Vitamin D (Ergocalciferol) 1.25 MG (50000 UT) Caps capsule Commonly known as:  DRISDOL Take 50,000 Units by mouth every Saturday.       Diagnostic Studies: Dg Knee Complete 4 Views Right  Result Date: 10/22/2018 CLINICAL DATA:  Initial evaluation for acute pain, recent total knee arthroplasty. EXAM: RIGHT KNEE - COMPLETE 4+ VIEW COMPARISON:  Prior radiograph from 10/10/2018. FINDINGS: Right total knee arthroplasty in place. There is an acute oblique periprosthetic fracture involving the distal right femoral shaft just above the arthroplasty. Associated posterior displacement with overlap. Distal femoral and proximal tibial components otherwise in stable position. Patellar component angulated and slightly displaced anteriorly by the fracture. Surrounding soft tissue swelling. IMPRESSION: Acute oblique periprosthetic fracture of the distal femoral shaft just above the right total knee arthroplasty.  Electronically Signed   By: Jeannine Boga M.D.   On: 10/22/2018 03:34   Dg Knee Right Port  Result Date: 10/10/2018 CLINICAL DATA:  Status post total right knee replacement. EXAM: PORTABLE RIGHT KNEE - 1-2 VIEW COMPARISON:  None. FINDINGS: Patient is status post right knee replacement. Hardware is in good position. Postoperative air seen in the soft tissues. IMPRESSION: Right knee replacement as above. Electronically Signed   By: Dorise Bullion III M.D   On: 10/10/2018 12:46   Dg C-arm 1-60 Min-no Report  Result Date: 10/23/2018 Fluoroscopy was utilized by the requesting physician.  No radiographic interpretation.   Dg Femur, Min 2 Views Right  Result Date: 10/23/2018 CLINICAL DATA:  Status post ORIF of a right distal femur fracture. EXAM: RIGHT FEMUR 2 VIEWS; DG C-ARM 1-60 MIN-NO REPORT COMPARISON:  None. FINDINGS: Five spot fluoro graphic images show placement of a lateral fixation plate across the fracture distal right femur, reducing the fracture components into near anatomic alignment. No new fractures or evidence of an operative complication. Orthopedic hardware is well-seated. Knee prosthetic components are well aligned. IMPRESSION: Successful reduction of the distal right femur fracture following ORIF. Electronically Signed   By: Lajean Manes M.D.   On: 10/23/2018 16:09   Dg Femur Port, Min 2 Views Right  Result Date: 10/23/2018 CLINICAL DATA:  Postop ORIF. EXAM: RIGHT FEMUR PORTABLE 2 VIEW COMPARISON:  None. FINDINGS: A plate is affixed to the femur with multiple screws. The plate crosses a distal femoral fracture. The patient is status post knee replacement. Hardware is in good position. IMPRESSION: ORIF as above. Electronically Signed   By: Dorise Bullion III M.D   On: 10/23/2018 19:36    Disposition: Discharged to SNF at [facility] on 10/26/2018 in stable condition.     Contact information for follow-up providers    Zaelynn Fuchs, Aaron Edelman, MD. Schedule an appointment as  soon as possible for a visit in 2 weeks.   Specialty:  Orthopedic Surgery Why:  For wound re-check, For suture removal Contact information: [address] [place] [address] [phone]  Contact information for after-discharge care    [facility] .   Service:  Skilled Nursing Contact information: [address] [place] [phone]                   Signed: Bertram Savin 10/27/2018, 6:20 PM

## 2018-10-25 NOTE — Clinical Social Work Placement (Signed)
Patient can d/c when medically stable.  PASRR received.  Insurance authorization received.  [id number] 3 day approval starting 11/16 next review day 11/18 Physical Therapy PI Speech SD Non therapy [facility] 541 Therapy minutes per week,   CLINICAL SOCIAL WORK PLACEMENT  NOTE  Date:  10/25/2018  Patient Details  Name: Alexis Rios MRN: [id number] Date of Birth: July 13, 1946  Clinical Social Work is seeking post-discharge placement for this patient at the [place] level of care (*CSW will initial, date and re-position this form in  chart as items are completed):  Yes   Patient/family provided with [place] Work Department's list of facilities offering this level of care within the geographic area requested by the patient (or if unable, by the patient's family).  Yes   Patient/family informed of their freedom to choose among providers that offer the needed level of care, that participate in Medicare, Medicaid or managed care program needed by the patient, have an available bed and are willing to accept the patient.  Yes   Patient/family informed of [facility]'s ownership interest in [facility] and [facility], as well as of the fact that they are under no obligation to receive care at these facilities.  PASRR submitted to EDS on 10/25/18     PASRR number received on 10/25/18     Existing PASRR number confirmed on       FL2 transmitted to all facilities in geographic area requested by pt/family on       FL2 transmitted to all facilities within larger geographic area on 10/25/18     Patient informed that his/her managed care company has contracts with or will negotiate with certain facilities, including the following:  [facility]     Yes   Patient/family informed of bed offers received.  Patient chooses bed at [facility]     Physician recommends and patient chooses bed at [facility]    Patient to be  transferred to [facility] on  .  Patient to be transferred to facility by [facility]      Patient family notified on   of transfer.  Name of family member notified:  Patient to notify her spouse.      PHYSICIAN Please prepare priority discharge summary, including medications     Additional Comment:    _______________________________________________ Lia Hopping, LCSW 10/25/2018, 4:46 PM

## 2018-10-25 NOTE — Progress Notes (Signed)
    Subjective:  Patient reports pain as mild to moderate.  Denies N/V/CP/SOB. No c/o.  Objective:   VITALS:   Vitals:   10/24/18 1949 10/24/18 2238 10/25/18 0456 10/25/18 0705  BP:  128/60 126/60   Pulse:  83 84 81  Resp:  16 16 16   Temp:  99.2 F (37.3 C) 98 F (36.7 C)   TempSrc:  Oral Oral   SpO2: 92% 99% 97% 94%  Weight:      Height:        NAD ABD soft Sensation intact distally Intact pulses distally Dorsiflexion/Plantar flexion intact Incision: dressing C/D/I Compartment soft   Lab Results  Component Value Date   WBC 7.8 10/25/2018   HGB 8.9 (L) 10/25/2018   HCT 28.6 (L) 10/25/2018   MCV 98.6 10/25/2018   PLT 259 10/25/2018   BMET    Component Value Date/Time   NA 137 10/25/2018 0438   K 3.3 (L) 10/25/2018 0438   CL 103 10/25/2018 0438   CO2 27 10/25/2018 0438   GLUCOSE 167 (H) 10/25/2018 0438   BUN 11 10/25/2018 0438   CREATININE 0.65 10/25/2018 0438   CALCIUM 7.4 (L) 10/25/2018 0438   GFRNONAA >60 10/25/2018 0438   GFRAA >60 10/25/2018 0438     Assessment/Plan: 2 Days Post-Op   Principal Problem:   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 Active Problems:   Periprosthetic fracture of shaft of femur   Periprosthetic fracture around prosthetic knee   TDWB RLE with walker ROM R knee DVT ppx: Lovenox, SCDs, TEDS PO pain control PT/OT Hypokalemia: replete ABLA:received 2 units PRBCs with appropriate response, monitor Dispo: SNF placement   Hilton Cork Nathaniel Yaden 10/25/2018, 7:53 AM   Rod Can, MD Cell: [phone] [place] is now [facility]  [place] [address], [place], [place] [address] Phone: [phone] [email or web address] Facebook  [employer]

## 2018-10-25 NOTE — Care Management Important Message (Signed)
Important Message  Patient Details  Name: [name] MRN: [id number] Date of Birth: 04/15/1946   Medicare Important Message Given:  Yes    Kerin Salen 10/25/2018, 10:54 AMImportant Message  Patient Details  Name: Alexis Rios MRN: [id number] Date of Birth: [birth year]   Medicare Important Message Given:  Yes    Kerin Salen 10/25/2018, 10:54 AM

## 2018-10-25 NOTE — Progress Notes (Signed)
OT Cancellation Note  Patient Details Name: Alexis Rios MRN: [id number] DOB: August 04, 1946   Cancelled Treatment:    Reason Eval/Treat Not Completed: Other (comment)  Noted PT eval and plan for ST SNF- will defer OT to SNF Kari Baars, [place] [phone] Office- [phone], Edwena Felty D 10/25/2018, 11:21 AM

## 2018-10-25 NOTE — Clinical Social Work Note (Signed)
Clinical Social Work Assessment  Patient Details  Name: Alexis Rios MRN: [id number] Date of Birth: [birth year]  Date of referral:  10/25/18               Reason for consult:  Facility Placement                Permission sought to share information with:  Family Supports, Chartered certified accountant granted to share information::  Yes, Verbal Permission Granted  Name::        Agency::  SNF   Relationship::  Spouse   Contact Information:     Housing/Transportation Living arrangements for the past 2 months:  Single Family Home Source of Information:  Patient Patient Interpreter Needed:  None Criminal Activity/Legal Involvement Pertinent to Current Situation/Hospitalization:  No - Comment as needed Significant Relationships:  Spouse Lives with:  Spouse Do you feel safe going back to the place where you live?  Yes Need for family participation in patient care:  yes ([place])  Care giving concerns:   She is s/p right total knee arthroplasty performed by Rod Can, MD on 10/10/2018. Patient states last night she was using the bathroom around 2130, tried to stand from the toilet and felt a crack followed by severe pain. She was unable to bear weight after this, but did not fall. Her husband and son assisted her to the car and brought her to the hospital Patient will follow up with SNF rehab.    Social Worker assessment / plan:  CSW met with the patient at bedside, explain role and reason for visit to assist with discharge to SNF. Patient is agreeable to SNF. She prefers a facility close to her home in [place]. CSW explain SNF process and provided bed offers. Patient reports she can complete her own ADL's and uses a walker to ambulate.   Patient agreeable to [facility] SNF for rehab. She has family members that work at the facility and look after her care.   FL2 completed. PASRR level II.   Plan:SNF   Employment status:  Retired Programmer, applications PT Recommendations:  [place] / Referral to community resources:  [place]  Patient/Family's Response to care:  Agreeable and Responding well to care.   Patient/Family's Understanding of and Emotional Response to Diagnosis, Current Treatment, and Prognosis:  "I am going backwards, I was able to set my foot on the bed without support now I cannot bed my knee." Patient express feeling discouraged and frustrated after "working hard with physical therapy and now starting over." Patient has a good understanding of her diagnosis and care.   Emotional Assessment Appearance:  Appears stated age Attitude/Demeanor/Rapport:    Affect (typically observed):  Accepting Orientation:  Oriented to Self, Oriented to Place, Oriented to  Time, Oriented to Situation Alcohol / Substance use:  Not Applicable Psych involvement (Current and /or in the community):  No (Comment)  Discharge Needs  Concerns to be addressed:  Discharge Planning Concerns Readmission within the last 30 days:  Yes Current discharge risk:  Dependent with Mobility Barriers to Discharge:  Continued Medical Work up   [employer], LCSW 10/25/2018, 10:35 AM

## 2018-10-25 NOTE — NC FL2 (Signed)
[place] LEVEL OF CARE SCREENING TOOL     IDENTIFICATION  Patient Name: AMELA HANDLEY Birthdate: 05-09-1946 Sex: female Admission Date (Current Location): 10/22/2018  [facility] and [place] Number:  Herbalist and Address:  [facility],  [place] [address] [place]      Provider Number: [id number]  Attending Physician Name and Address:  Rod Can, MD  Relative Name and Phone Number:       Current Level of Care: Hospital Recommended Level of Care: [place] Prior Approval Number:    Date Approved/Denied:   PASRR Number:  pending   Discharge Plan: SNF    Current Diagnoses: Patient Active Problem List   Diagnosis Date Noted  . Periprosthetic fracture around prosthetic knee 10/23/2018  . Periprosthetic fracture of shaft of femur 10/22/2018  . 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 Osteoarthritis of right knee 10/10/2018  . Skin yeast infection 07/05/2015  . CAD (coronary artery disease)   . Chest pain 11/13/2013  . Intermittent lightheadedness 11/13/2013  . GERD (gastroesophageal reflux disease) 09/09/2012  . Obesity 06/25/2012  . Disorder of shoulder 06/04/2012  . Asthma 05/28/2012  . Diastolic dysfunction 82/95/6213  . OSA (obstructive sleep apnea) 05/10/2012  . Dyspnea 05/07/2012  . Chest pain 08/24/2011  . Dyslipidemia 08/24/2011    Orientation RESPIRATION BLADDER Height & Weight     Self, Time, Situation, Place  Normal Continent Weight: 198 lb (89.8 kg) Height:  5\' 1"  (154.9 cm)  BEHAVIORAL SYMPTOMS/MOOD NEUROLOGICAL BOWEL NUTRITION STATUS      Continent Diet  AMBULATORY STATUS COMMUNICATION OF NEEDS Skin   Extensive Assist Verbally Surgical wounds                       Personal Care Assistance Level of Assistance  Bathing, Feeding, Dressing Bathing Assistance: Maximum assistance Feeding assistance: Independent Dressing Assistance:  Maximum assistance     Functional Limitations Info  Sight, Hearing, Speech Sight Info: Adequate Hearing Info: Adequate Speech Info: Adequate    SPECIAL CARE FACTORS FREQUENCY  PT (By licensed PT), OT (By licensed OT)     PT Frequency: 7x/week OT Frequency: 7x/week            Contractures Contractures Info: Not present    Additional Factors Info  Code Status, Allergies, Psychotropic Code Status Info: Fullcode Allergies Info: Allergies: Sulfa Antibiotics, Codeine, Cymbalta Duloxetine Hcl, Keflex Cephalexin, Macrodantin Nitrofurantoin Macrocrystal, Mucinex Guaifenesin Er, Tape Psychotropic Info: xanax, celexa          Current Medications (10/25/2018):  This is the current hospital active medication list Current Facility-Administered Medications  Medication Dose Route Frequency Provider Last Rate Last Dose  . 0.9 %  sodium chloride infusion (Manually program via Guardrails IV Fluids)   Intravenous Once Rod Can, MD      . 0.9 %  sodium chloride infusion (Manually program via Guardrails IV Fluids)   Intravenous Once Rod Can, MD      . acetaminophen (TYLENOL) tablet 325-650 mg  325-650 mg Oral Q6H PRN Swinteck, Aaron Edelman, MD      . albuterol (PROVENTIL) (2.5 MG/3ML) 0.083% nebulizer solution 2.5 mg  2.5 mg Nebulization Q6H PRN Swinteck, Aaron Edelman, MD      . ALPRAZolam Duanne Moron) tablet 0.25 mg  0.25 mg Oral QHS Rod Can, MD   0.25 mg at 10/24/18 2213  . citalopram (CELEXA) tablet 40 mg  40 mg Oral QHS Rod Can, MD  40 mg at 10/24/18 2213  . diphenhydrAMINE (BENADRYL) capsule 25 mg  25 mg Oral Q6H PRN Rod Can, MD   25 mg at 10/24/18 1039  . docusate sodium (COLACE) capsule 100 mg  100 mg Oral BID Rod Can, MD   100 mg at 10/25/18 4825  . enoxaparin (LOVENOX) injection 40 mg  40 mg Subcutaneous Q24H Rod Can, MD   40 mg at 10/25/18 0723  . fentaNYL (SUBLIMAZE) injection 50 mcg  50 mcg Intravenous Q1H PRN Rod Can, MD   50 mcg at  10/23/18 1055  . gabapentin (NEURONTIN) capsule 400 mg  400 mg Oral QHS Rod Can, MD   400 mg at 10/24/18 2213  . HYDROcodone-acetaminophen (NORCO) 7.5-325 MG per tablet 1-2 tablet  1-2 tablet Oral Q4H PRN Rod Can, MD   1 tablet at 10/24/18 0550  . HYDROcodone-acetaminophen (NORCO/VICODIN) 5-325 MG per tablet 1-2 tablet  1-2 tablet Oral Q4H PRN Rod Can, MD   2 tablet at 10/25/18 0723  . lactated ringers infusion   Intravenous Continuous Rod Can, MD 75 mL/hr at 10/24/18 1645    . menthol-cetylpyridinium (CEPACOL) lozenge 3 mg  1 lozenge Oral PRN Swinteck, Aaron Edelman, MD       Or  . phenol (CHLORASEPTIC) mouth spray 1 spray  1 spray Mouth/Throat PRN Swinteck, Aaron Edelman, MD      . metoCLOPramide (REGLAN) tablet 5-10 mg  5-10 mg Oral Q8H PRN Swinteck, Aaron Edelman, MD       Or  . metoCLOPramide (REGLAN) injection 5-10 mg  5-10 mg Intravenous Q8H PRN Swinteck, Aaron Edelman, MD      . mirabegron ER (MYRBETRIQ) tablet 25 mg  25 mg Oral QHS Rod Can, MD   25 mg at 10/22/18 2222  . mometasone-formoterol (DULERA) 100-5 MCG/ACT inhaler 2 puff  2 puff Inhalation BID Rod Can, MD   2 puff at 10/25/18 0705  . morphine 2 MG/ML injection 0.5-1 mg  0.5-1 mg Intravenous Q2H PRN Swinteck, Aaron Edelman, MD      . ondansetron [facility]) injection 4 mg  4 mg Intravenous Q8H PRN Rod Can, MD   4 mg at 10/24/18 1039  . ondansetron (ZOFRAN) tablet 4 mg  4 mg Oral Q6H PRN Swinteck, Aaron Edelman, MD      . pantoprazole (PROTONIX) EC tablet 40 mg  40 mg Oral Daily Rod Can, MD   40 mg at 10/25/18 0723  . [START ON 10/27/2018] rosuvastatin (CRESTOR) tablet 5 mg  5 mg Oral Q Lilyan Gilford, Aaron Edelman, MD      . senna [facility]) tablet 8.6 mg  1 tablet Oral BID Rod Can, MD   8.6 mg at 10/25/18 0723  . sucralfate (CARAFATE) tablet 1 g  1 g Oral QID Rod Can, MD   1 g at 10/25/18 0037     Discharge Medications: Please see discharge summary for a list of discharge medications.  Relevant Imaging  Results:  Relevant Lab Results:   Additional Information [id number]  Lia Hopping, LCSW

## 2018-10-26 DIAGNOSIS — E119 Type 2 diabetes mellitus without complications: Secondary | ICD-10-CM | POA: Diagnosis not present

## 2018-10-26 DIAGNOSIS — E1340 Other specified diabetes mellitus with diabetic neuropathy, unspecified: Secondary | ICD-10-CM | POA: Diagnosis not present

## 2018-10-26 DIAGNOSIS — I2510 Atherosclerotic heart disease of native coronary artery without angina pectoris: Secondary | ICD-10-CM | POA: Diagnosis not present

## 2018-10-26 DIAGNOSIS — K219 Gastro-esophageal reflux disease without esophagitis: Secondary | ICD-10-CM | POA: Diagnosis not present

## 2018-10-26 DIAGNOSIS — M1990 Unspecified osteoarthritis, unspecified site: Secondary | ICD-10-CM | POA: Diagnosis not present

## 2018-10-26 DIAGNOSIS — D500 Iron deficiency anemia secondary to blood loss (chronic): Secondary | ICD-10-CM | POA: Diagnosis not present

## 2018-10-26 DIAGNOSIS — G43909 Migraine, unspecified, not intractable, without status migrainosus: Secondary | ICD-10-CM | POA: Diagnosis not present

## 2018-10-26 DIAGNOSIS — I10 Essential (primary) hypertension: Secondary | ICD-10-CM | POA: Diagnosis not present

## 2018-10-26 DIAGNOSIS — Z471 Aftercare following joint replacement surgery: Secondary | ICD-10-CM | POA: Diagnosis not present

## 2018-10-26 DIAGNOSIS — M9711XD 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, subsequent encounter: Secondary | ICD-10-CM | POA: Diagnosis not present

## 2018-10-26 DIAGNOSIS — R262 Difficulty in walking, not elsewhere classified: Secondary | ICD-10-CM | POA: Diagnosis not present

## 2018-10-26 DIAGNOSIS — N3281 Overactive bladder: Secondary | ICD-10-CM | POA: Diagnosis not present

## 2018-10-26 DIAGNOSIS — J45909 Unspecified asthma, uncomplicated: Secondary | ICD-10-CM | POA: Diagnosis not present

## 2018-10-26 DIAGNOSIS — J449 Chronic obstructive pulmonary disease, unspecified: Secondary | ICD-10-CM | POA: Diagnosis not present

## 2018-10-26 DIAGNOSIS — D508 Other iron deficiency anemias: Secondary | ICD-10-CM | POA: Diagnosis not present

## 2018-10-26 DIAGNOSIS — Z96651 Presence of right artificial knee joint: Secondary | ICD-10-CM | POA: Diagnosis not present

## 2018-10-26 DIAGNOSIS — R5381 Other malaise: Secondary | ICD-10-CM | POA: Diagnosis not present

## 2018-10-26 DIAGNOSIS — M2550 Pain in unspecified joint: Secondary | ICD-10-CM | POA: Diagnosis not present

## 2018-10-26 DIAGNOSIS — M6281 Muscle weakness (generalized): Secondary | ICD-10-CM | POA: Diagnosis not present

## 2018-10-26 DIAGNOSIS — Z7401 Bed confinement status: Secondary | ICD-10-CM | POA: Diagnosis not present

## 2018-10-26 LAB — CBC
HEMATOCRIT: 29.6 % — AB (ref 36.0–46.0)
HEMOGLOBIN: 9 g/dL — AB (ref 12.0–15.0)
MCH: 30.1 pg (ref 26.0–34.0)
MCHC: 30.4 g/dL (ref 30.0–36.0)
MCV: 99 fL (ref 80.0–100.0)
NRBC: 0 % (ref 0.0–0.2)
Platelets: 279 10*3/uL (ref 150–400)
RBC: 2.99 MIL/uL — AB (ref 3.87–5.11)
RDW: 15.8 % — ABNORMAL HIGH (ref 11.5–15.5)
WBC: 9.6 10*3/uL (ref 4.0–10.5)

## 2018-10-26 NOTE — Progress Notes (Signed)
Report called to Donia Pounds LPN at [facility] via telephone. Pt stable at this time though remains very weak overall. Pt medicated for pain. Family aware of plan overall and transport.

## 2018-10-26 NOTE — Progress Notes (Signed)
Pt stable at time of ems transport to [facility]. VS and pain stable at this time.

## 2018-10-26 NOTE — Progress Notes (Signed)
Subjective: 3 Days Post-Op Procedure(s) (LRB): OPEN REDUCTION INTERNAL FIXATION (ORIF) RIGHT  PERIPROSTHETIC FRACTURE (Right) Patient reports pain as moderate.   Patient seen in rounds for Dr. Lyla Glassing. Patient is well, and has had no acute complaints or problems. Voiding without difficulty and positive flatus. Denies chest pain, SOB, or calf pain. Plan is to go Skilled nursing facility after hospital stay.  Objective: Vital signs in last 24 hours: Temp:  [98.1 F (36.7 C)-98.7 F (37.1 C)] 98.1 F (36.7 C) (11/16 0627) Pulse Rate:  [83-101] 90 (11/16 0627) Resp:  [16-18] 16 (11/16 0627) BP: (122-132)/(58-79) 122/79 (11/16 0627) SpO2:  [94 %-97 %] 94 % (11/16 0627)  Intake/Output from previous day:  Intake/Output Summary (Last 24 hours) at 10/26/2018 0741 Last data filed at 10/26/2018 0736 Gross per 24 hour  Intake 720 ml  Output 400 ml  Net 320 ml    Intake/Output this shift: Total I/O In: 360 [P.O.:360] Out: -   Labs: Recent Labs    10/23/18 1530 10/23/18 1934 10/24/18 0415 10/25/18 0438 10/26/18 0357  HGB 7.8* 7.7* 6.8* 8.9* 9.0*   Recent Labs    10/25/18 0438 10/26/18 0357  WBC 7.8 9.6  RBC 2.90* 2.99*  HCT 28.6* 29.6*  PLT 259 279   Recent Labs    10/24/18 0415 10/25/18 0438  NA 136 137  K 3.7 3.3*  CL 103 103  CO2 25 27  BUN 8 11  CREATININE 0.68 0.65  GLUCOSE 142* 167*  CALCIUM 7.7* 7.4*   Exam: General - Patient is Alert and Oriented Extremity - Neurologically intact Neurovascular intact Sensation intact distally Dorsiflexion/Plantar flexion intact Dressing/Incision - Aquacells in place, no drainage Motor Function - intact, moving foot and toes well on exam.   Past Medical History:  Diagnosis Date  . Anemia   . Anxiety   . Arthritis   . Asthma   . CAD (coronary artery disease)    Cath 2009, AV groove CX 30%, RCA 20 %  . Candidiasis of skin and nail   . Carpal tunnel syndrome   . Chronic back pain   . Chronic headaches      migraines  . Complication of anesthesia    if no breathing tx before anesthesia wakes with asthma  . COPD (chronic obstructive pulmonary disease) ([place])   . Depression   . Diabetes mellitus    DIET CONTROLLED NOW  . Diverticulosis   . Gastroparesis    ?  Marland Kitchen GERD (gastroesophageal reflux disease)   . H/O echocardiogram    Echo 09/2011: Mild LVH, EF 16-07%, grade 1 diastolic dysfunction, trivial AI, mild MR, mild LAE.  . H/O exercise stress test    Dobutamine Myoview 09/2011: No scar or ischemia, no EKG changes, study not gated.  . HH (hiatus hernia)   . History of gallstones   . History of pneumonia   . Hypercholesteremia   . Hypertension   . IBS (irritable bowel syndrome)   . Osteoporosis   . Peripheral neuropathy    LEGS AND FEET  . Pneumonia LAST TIME 8 YRS AGO  . S/P cardiac catheterization    LHC 03/2008: Mid AV groove circumflex 30%, mid RCA 20%, EF 60%  . Seasonal allergies   . Skin cancer    scc/bcc  . Skin yeast infection 07/05/2015  . Sleep apnea    cpap   . Stones in the urinary tract   . Subclinical hypothyroidism   . 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   Weight as of this encounter: 89.8 kg. Up with therapy  DVT Prophylaxis - Lovenox Weight-bearing as tolerated  Plan for discharge to SNF once bed available. Follow-up in the office in 2 weeks with Dr. Lyla Glassing.  Theresa Duty, PA-C Orthopedic Surgery 10/26/2018, 7:41 AM

## 2018-10-26 NOTE — Progress Notes (Addendum)
Clinical Social Worker facilitated patient discharge including contacting patient family and facility to confirm patient discharge plans.  Clinical information faxed to facility and family agreeable with plan.  CSW arranged ambulance transport via [facility] to [facility] .  RN to call [phone] ask for 100 hall RN for report prior to discharge. Transport has been called for 12:30 at the request of RN   Clinical Social Worker will sign off for now as social work intervention is no longer needed. Please consult [place] again if new need arises.  Rhea Pink, MSW, [place]

## 2018-10-28 ENCOUNTER — Encounter (HOSPITAL_COMMUNITY): Payer: Self-pay | Admitting: Orthopedic Surgery

## 2018-10-28 DIAGNOSIS — M1990 Unspecified osteoarthritis, unspecified site: Secondary | ICD-10-CM | POA: Diagnosis not present

## 2018-10-28 DIAGNOSIS — D508 Other iron deficiency anemias: Secondary | ICD-10-CM | POA: Diagnosis not present

## 2018-10-28 DIAGNOSIS — J449 Chronic obstructive pulmonary disease, unspecified: Secondary | ICD-10-CM | POA: Diagnosis not present

## 2018-10-28 DIAGNOSIS — M9711XD 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, subsequent encounter: Secondary | ICD-10-CM | POA: Diagnosis not present

## 2018-10-29 ENCOUNTER — Other Ambulatory Visit: Payer: Self-pay

## 2018-10-29 NOTE — Patient Outreach (Signed)
[facility]) Care Management  10/29/2018  Mamta Rimmer Christopherson 1946/05/24 [id number]   Medication Adherence call to Mrs. Sunday Spillers Opie patient did not answer and could not leave a message patient is due on Rosuvastatin 5 mg under [place].   [place] Management Direct Dial [phone]  Fax [phone] [email or web address]

## 2018-10-30 ENCOUNTER — Ambulatory Visit: Payer: Medicare Other | Admitting: Nutrition

## 2018-11-04 DIAGNOSIS — M9711XD 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, subsequent encounter: Secondary | ICD-10-CM | POA: Diagnosis not present

## 2018-11-10 DIAGNOSIS — J449 Chronic obstructive pulmonary disease, unspecified: Secondary | ICD-10-CM | POA: Diagnosis not present

## 2018-11-10 DIAGNOSIS — M9711XD 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, subsequent encounter: Secondary | ICD-10-CM | POA: Diagnosis not present

## 2018-11-10 DIAGNOSIS — D500 Iron deficiency anemia secondary to blood loss (chronic): Secondary | ICD-10-CM | POA: Diagnosis not present

## 2018-11-10 DIAGNOSIS — E119 Type 2 diabetes mellitus without complications: Secondary | ICD-10-CM | POA: Diagnosis not present

## 2018-11-11 DIAGNOSIS — M9711XD 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, subsequent encounter: Secondary | ICD-10-CM | POA: Diagnosis not present

## 2018-11-12 DIAGNOSIS — M9711XD 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, subsequent encounter: Secondary | ICD-10-CM | POA: Diagnosis not present

## 2018-11-12 DIAGNOSIS — Z471 Aftercare following joint replacement surgery: Secondary | ICD-10-CM | POA: Diagnosis not present

## 2018-11-13 DIAGNOSIS — J449 Chronic obstructive pulmonary disease, unspecified: Secondary | ICD-10-CM | POA: Diagnosis not present

## 2018-11-13 DIAGNOSIS — E119 Type 2 diabetes mellitus without complications: Secondary | ICD-10-CM | POA: Diagnosis not present

## 2018-11-13 DIAGNOSIS — M1990 Unspecified osteoarthritis, unspecified site: Secondary | ICD-10-CM | POA: Diagnosis not present

## 2018-11-13 DIAGNOSIS — M9711XD 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, subsequent encounter: Secondary | ICD-10-CM | POA: Diagnosis not present

## 2018-11-16 DIAGNOSIS — J449 Chronic obstructive pulmonary disease, unspecified: Secondary | ICD-10-CM | POA: Diagnosis not present

## 2018-11-16 DIAGNOSIS — Z9181 History of falling: Secondary | ICD-10-CM | POA: Diagnosis not present

## 2018-11-16 DIAGNOSIS — I10 Essential (primary) hypertension: Secondary | ICD-10-CM | POA: Diagnosis not present

## 2018-11-16 DIAGNOSIS — E1142 Type 2 diabetes mellitus with diabetic polyneuropathy: Secondary | ICD-10-CM | POA: Diagnosis not present

## 2018-11-16 DIAGNOSIS — M9711XD 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, subsequent encounter: Secondary | ICD-10-CM | POA: Diagnosis not present

## 2018-11-16 DIAGNOSIS — Z87440 Personal history of urinary (tract) infections: Secondary | ICD-10-CM | POA: Diagnosis not present

## 2018-11-16 DIAGNOSIS — M810 Age-related osteoporosis without current pathological fracture: Secondary | ICD-10-CM | POA: Diagnosis not present

## 2018-11-16 DIAGNOSIS — S72331D Displaced oblique fracture of shaft of right femur, subsequent encounter for closed fracture with routine healing: Secondary | ICD-10-CM | POA: Diagnosis not present

## 2018-11-16 DIAGNOSIS — K5790 Diverticulosis of intestine, part unspecified, without perforation or abscess without bleeding: Secondary | ICD-10-CM | POA: Diagnosis not present

## 2018-11-16 DIAGNOSIS — M1990 Unspecified osteoarthritis, unspecified site: Secondary | ICD-10-CM | POA: Diagnosis not present

## 2018-11-16 DIAGNOSIS — I2510 Atherosclerotic heart disease of native coronary artery without angina pectoris: Secondary | ICD-10-CM | POA: Diagnosis not present

## 2018-11-16 DIAGNOSIS — N3281 Overactive bladder: Secondary | ICD-10-CM | POA: Diagnosis not present

## 2018-11-16 DIAGNOSIS — G4733 Obstructive sleep apnea (adult) (pediatric): Secondary | ICD-10-CM | POA: Diagnosis not present

## 2018-11-16 DIAGNOSIS — G43909 Migraine, unspecified, not intractable, without status migrainosus: Secondary | ICD-10-CM | POA: Diagnosis not present

## 2018-11-16 DIAGNOSIS — D649 Anemia, unspecified: Secondary | ICD-10-CM | POA: Diagnosis not present

## 2018-11-19 DIAGNOSIS — I10 Essential (primary) hypertension: Secondary | ICD-10-CM | POA: Diagnosis not present

## 2018-11-19 DIAGNOSIS — J060 Acute laryngopharyngitis: Secondary | ICD-10-CM | POA: Diagnosis not present

## 2018-11-19 DIAGNOSIS — M1711 Unilateral primary osteoarthritis, right knee: Secondary | ICD-10-CM | POA: Diagnosis not present

## 2018-11-20 DIAGNOSIS — Z9181 History of falling: Secondary | ICD-10-CM | POA: Diagnosis not present

## 2018-11-20 DIAGNOSIS — G4733 Obstructive sleep apnea (adult) (pediatric): Secondary | ICD-10-CM | POA: Diagnosis not present

## 2018-11-20 DIAGNOSIS — G43909 Migraine, unspecified, not intractable, without status migrainosus: Secondary | ICD-10-CM | POA: Diagnosis not present

## 2018-11-20 DIAGNOSIS — M9711XD 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, subsequent encounter: Secondary | ICD-10-CM | POA: Diagnosis not present

## 2018-11-20 DIAGNOSIS — I2510 Atherosclerotic heart disease of native coronary artery without angina pectoris: Secondary | ICD-10-CM | POA: Diagnosis not present

## 2018-11-20 DIAGNOSIS — Z87440 Personal history of urinary (tract) infections: Secondary | ICD-10-CM | POA: Diagnosis not present

## 2018-11-20 DIAGNOSIS — E1142 Type 2 diabetes mellitus with diabetic polyneuropathy: Secondary | ICD-10-CM | POA: Diagnosis not present

## 2018-11-20 DIAGNOSIS — N3281 Overactive bladder: Secondary | ICD-10-CM | POA: Diagnosis not present

## 2018-11-20 DIAGNOSIS — M810 Age-related osteoporosis without current pathological fracture: Secondary | ICD-10-CM | POA: Diagnosis not present

## 2018-11-20 DIAGNOSIS — J449 Chronic obstructive pulmonary disease, unspecified: Secondary | ICD-10-CM | POA: Diagnosis not present

## 2018-11-20 DIAGNOSIS — K5790 Diverticulosis of intestine, part unspecified, without perforation or abscess without bleeding: Secondary | ICD-10-CM | POA: Diagnosis not present

## 2018-11-20 DIAGNOSIS — M1990 Unspecified osteoarthritis, unspecified site: Secondary | ICD-10-CM | POA: Diagnosis not present

## 2018-11-20 DIAGNOSIS — D649 Anemia, unspecified: Secondary | ICD-10-CM | POA: Diagnosis not present

## 2018-11-20 DIAGNOSIS — S72331D Displaced oblique fracture of shaft of right femur, subsequent encounter for closed fracture with routine healing: Secondary | ICD-10-CM | POA: Diagnosis not present

## 2018-11-20 DIAGNOSIS — I10 Essential (primary) hypertension: Secondary | ICD-10-CM | POA: Diagnosis not present

## 2018-11-22 DIAGNOSIS — Z87440 Personal history of urinary (tract) infections: Secondary | ICD-10-CM | POA: Diagnosis not present

## 2018-11-22 DIAGNOSIS — M1990 Unspecified osteoarthritis, unspecified site: Secondary | ICD-10-CM | POA: Diagnosis not present

## 2018-11-22 DIAGNOSIS — M9711XD 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, subsequent encounter: Secondary | ICD-10-CM | POA: Diagnosis not present

## 2018-11-22 DIAGNOSIS — E1142 Type 2 diabetes mellitus with diabetic polyneuropathy: Secondary | ICD-10-CM | POA: Diagnosis not present

## 2018-11-22 DIAGNOSIS — G4733 Obstructive sleep apnea (adult) (pediatric): Secondary | ICD-10-CM | POA: Diagnosis not present

## 2018-11-22 DIAGNOSIS — I10 Essential (primary) hypertension: Secondary | ICD-10-CM | POA: Diagnosis not present

## 2018-11-22 DIAGNOSIS — I2510 Atherosclerotic heart disease of native coronary artery without angina pectoris: Secondary | ICD-10-CM | POA: Diagnosis not present

## 2018-11-22 DIAGNOSIS — G43909 Migraine, unspecified, not intractable, without status migrainosus: Secondary | ICD-10-CM | POA: Diagnosis not present

## 2018-11-22 DIAGNOSIS — N3281 Overactive bladder: Secondary | ICD-10-CM | POA: Diagnosis not present

## 2018-11-22 DIAGNOSIS — K5790 Diverticulosis of intestine, part unspecified, without perforation or abscess without bleeding: Secondary | ICD-10-CM | POA: Diagnosis not present

## 2018-11-22 DIAGNOSIS — S72331D Displaced oblique fracture of shaft of right femur, subsequent encounter for closed fracture with routine healing: Secondary | ICD-10-CM | POA: Diagnosis not present

## 2018-11-22 DIAGNOSIS — J449 Chronic obstructive pulmonary disease, unspecified: Secondary | ICD-10-CM | POA: Diagnosis not present

## 2018-11-22 DIAGNOSIS — D649 Anemia, unspecified: Secondary | ICD-10-CM | POA: Diagnosis not present

## 2018-11-22 DIAGNOSIS — Z9181 History of falling: Secondary | ICD-10-CM | POA: Diagnosis not present

## 2018-11-22 DIAGNOSIS — M810 Age-related osteoporosis without current pathological fracture: Secondary | ICD-10-CM | POA: Diagnosis not present

## 2018-11-23 DIAGNOSIS — I10 Essential (primary) hypertension: Secondary | ICD-10-CM | POA: Diagnosis not present

## 2018-11-23 DIAGNOSIS — K5790 Diverticulosis of intestine, part unspecified, without perforation or abscess without bleeding: Secondary | ICD-10-CM | POA: Diagnosis not present

## 2018-11-23 DIAGNOSIS — D649 Anemia, unspecified: Secondary | ICD-10-CM | POA: Diagnosis not present

## 2018-11-23 DIAGNOSIS — N3281 Overactive bladder: Secondary | ICD-10-CM | POA: Diagnosis not present

## 2018-11-23 DIAGNOSIS — M1990 Unspecified osteoarthritis, unspecified site: Secondary | ICD-10-CM | POA: Diagnosis not present

## 2018-11-23 DIAGNOSIS — M9711XD 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, subsequent encounter: Secondary | ICD-10-CM | POA: Diagnosis not present

## 2018-11-23 DIAGNOSIS — J449 Chronic obstructive pulmonary disease, unspecified: Secondary | ICD-10-CM | POA: Diagnosis not present

## 2018-11-23 DIAGNOSIS — I2510 Atherosclerotic heart disease of native coronary artery without angina pectoris: Secondary | ICD-10-CM | POA: Diagnosis not present

## 2018-11-23 DIAGNOSIS — Z9181 History of falling: Secondary | ICD-10-CM | POA: Diagnosis not present

## 2018-11-23 DIAGNOSIS — M810 Age-related osteoporosis without current pathological fracture: Secondary | ICD-10-CM | POA: Diagnosis not present

## 2018-11-23 DIAGNOSIS — E1142 Type 2 diabetes mellitus with diabetic polyneuropathy: Secondary | ICD-10-CM | POA: Diagnosis not present

## 2018-11-23 DIAGNOSIS — S72331D Displaced oblique fracture of shaft of right femur, subsequent encounter for closed fracture with routine healing: Secondary | ICD-10-CM | POA: Diagnosis not present

## 2018-11-23 DIAGNOSIS — Z87440 Personal history of urinary (tract) infections: Secondary | ICD-10-CM | POA: Diagnosis not present

## 2018-11-23 DIAGNOSIS — G43909 Migraine, unspecified, not intractable, without status migrainosus: Secondary | ICD-10-CM | POA: Diagnosis not present

## 2018-11-23 DIAGNOSIS — G4733 Obstructive sleep apnea (adult) (pediatric): Secondary | ICD-10-CM | POA: Diagnosis not present

## 2018-11-26 DIAGNOSIS — I2510 Atherosclerotic heart disease of native coronary artery without angina pectoris: Secondary | ICD-10-CM | POA: Diagnosis not present

## 2018-11-26 DIAGNOSIS — M9711XD 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, subsequent encounter: Secondary | ICD-10-CM | POA: Diagnosis not present

## 2018-11-26 DIAGNOSIS — D649 Anemia, unspecified: Secondary | ICD-10-CM | POA: Diagnosis not present

## 2018-11-26 DIAGNOSIS — M810 Age-related osteoporosis without current pathological fracture: Secondary | ICD-10-CM | POA: Diagnosis not present

## 2018-11-26 DIAGNOSIS — I10 Essential (primary) hypertension: Secondary | ICD-10-CM | POA: Diagnosis not present

## 2018-11-26 DIAGNOSIS — G43909 Migraine, unspecified, not intractable, without status migrainosus: Secondary | ICD-10-CM | POA: Diagnosis not present

## 2018-11-26 DIAGNOSIS — J449 Chronic obstructive pulmonary disease, unspecified: Secondary | ICD-10-CM | POA: Diagnosis not present

## 2018-11-26 DIAGNOSIS — Z9181 History of falling: Secondary | ICD-10-CM | POA: Diagnosis not present

## 2018-11-26 DIAGNOSIS — M1990 Unspecified osteoarthritis, unspecified site: Secondary | ICD-10-CM | POA: Diagnosis not present

## 2018-11-26 DIAGNOSIS — E1142 Type 2 diabetes mellitus with diabetic polyneuropathy: Secondary | ICD-10-CM | POA: Diagnosis not present

## 2018-11-26 DIAGNOSIS — N3281 Overactive bladder: Secondary | ICD-10-CM | POA: Diagnosis not present

## 2018-11-26 DIAGNOSIS — S72331D Displaced oblique fracture of shaft of right femur, subsequent encounter for closed fracture with routine healing: Secondary | ICD-10-CM | POA: Diagnosis not present

## 2018-11-26 DIAGNOSIS — G4733 Obstructive sleep apnea (adult) (pediatric): Secondary | ICD-10-CM | POA: Diagnosis not present

## 2018-11-26 DIAGNOSIS — Z87440 Personal history of urinary (tract) infections: Secondary | ICD-10-CM | POA: Diagnosis not present

## 2018-11-26 DIAGNOSIS — K5790 Diverticulosis of intestine, part unspecified, without perforation or abscess without bleeding: Secondary | ICD-10-CM | POA: Diagnosis not present

## 2018-11-28 DIAGNOSIS — D649 Anemia, unspecified: Secondary | ICD-10-CM | POA: Diagnosis not present

## 2018-11-28 DIAGNOSIS — M1990 Unspecified osteoarthritis, unspecified site: Secondary | ICD-10-CM | POA: Diagnosis not present

## 2018-11-28 DIAGNOSIS — E1142 Type 2 diabetes mellitus with diabetic polyneuropathy: Secondary | ICD-10-CM | POA: Diagnosis not present

## 2018-11-28 DIAGNOSIS — M9711XD 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, subsequent encounter: Secondary | ICD-10-CM | POA: Diagnosis not present

## 2018-11-28 DIAGNOSIS — S72331D Displaced oblique fracture of shaft of right femur, subsequent encounter for closed fracture with routine healing: Secondary | ICD-10-CM | POA: Diagnosis not present

## 2018-11-28 DIAGNOSIS — N3281 Overactive bladder: Secondary | ICD-10-CM | POA: Diagnosis not present

## 2018-11-28 DIAGNOSIS — J449 Chronic obstructive pulmonary disease, unspecified: Secondary | ICD-10-CM | POA: Diagnosis not present

## 2018-11-28 DIAGNOSIS — M810 Age-related osteoporosis without current pathological fracture: Secondary | ICD-10-CM | POA: Diagnosis not present

## 2018-11-28 DIAGNOSIS — G4733 Obstructive sleep apnea (adult) (pediatric): Secondary | ICD-10-CM | POA: Diagnosis not present

## 2018-11-28 DIAGNOSIS — I10 Essential (primary) hypertension: Secondary | ICD-10-CM | POA: Diagnosis not present

## 2018-11-28 DIAGNOSIS — Z9181 History of falling: Secondary | ICD-10-CM | POA: Diagnosis not present

## 2018-11-28 DIAGNOSIS — G43909 Migraine, unspecified, not intractable, without status migrainosus: Secondary | ICD-10-CM | POA: Diagnosis not present

## 2018-11-28 DIAGNOSIS — I2510 Atherosclerotic heart disease of native coronary artery without angina pectoris: Secondary | ICD-10-CM | POA: Diagnosis not present

## 2018-11-28 DIAGNOSIS — K5790 Diverticulosis of intestine, part unspecified, without perforation or abscess without bleeding: Secondary | ICD-10-CM | POA: Diagnosis not present

## 2018-11-28 DIAGNOSIS — Z87440 Personal history of urinary (tract) infections: Secondary | ICD-10-CM | POA: Diagnosis not present

## 2018-11-29 DIAGNOSIS — M48061 Spinal stenosis, lumbar region without neurogenic claudication: Secondary | ICD-10-CM | POA: Diagnosis not present

## 2018-12-02 DIAGNOSIS — J449 Chronic obstructive pulmonary disease, unspecified: Secondary | ICD-10-CM | POA: Diagnosis not present

## 2018-12-02 DIAGNOSIS — M1990 Unspecified osteoarthritis, unspecified site: Secondary | ICD-10-CM | POA: Diagnosis not present

## 2018-12-02 DIAGNOSIS — Z87440 Personal history of urinary (tract) infections: Secondary | ICD-10-CM | POA: Diagnosis not present

## 2018-12-02 DIAGNOSIS — Z9181 History of falling: Secondary | ICD-10-CM | POA: Diagnosis not present

## 2018-12-02 DIAGNOSIS — M9711XD 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, subsequent encounter: Secondary | ICD-10-CM | POA: Diagnosis not present

## 2018-12-02 DIAGNOSIS — Z471 Aftercare following joint replacement surgery: Secondary | ICD-10-CM | POA: Diagnosis not present

## 2018-12-02 DIAGNOSIS — M810 Age-related osteoporosis without current pathological fracture: Secondary | ICD-10-CM | POA: Diagnosis not present

## 2018-12-02 DIAGNOSIS — D649 Anemia, unspecified: Secondary | ICD-10-CM | POA: Diagnosis not present

## 2018-12-02 DIAGNOSIS — I2510 Atherosclerotic heart disease of native coronary artery without angina pectoris: Secondary | ICD-10-CM | POA: Diagnosis not present

## 2018-12-02 DIAGNOSIS — K5790 Diverticulosis of intestine, part unspecified, without perforation or abscess without bleeding: Secondary | ICD-10-CM | POA: Diagnosis not present

## 2018-12-02 DIAGNOSIS — I10 Essential (primary) hypertension: Secondary | ICD-10-CM | POA: Diagnosis not present

## 2018-12-02 DIAGNOSIS — N3281 Overactive bladder: Secondary | ICD-10-CM | POA: Diagnosis not present

## 2018-12-02 DIAGNOSIS — G43909 Migraine, unspecified, not intractable, without status migrainosus: Secondary | ICD-10-CM | POA: Diagnosis not present

## 2018-12-02 DIAGNOSIS — G4733 Obstructive sleep apnea (adult) (pediatric): Secondary | ICD-10-CM | POA: Diagnosis not present

## 2018-12-02 DIAGNOSIS — E1142 Type 2 diabetes mellitus with diabetic polyneuropathy: Secondary | ICD-10-CM | POA: Diagnosis not present

## 2018-12-02 DIAGNOSIS — S72331D Displaced oblique fracture of shaft of right femur, subsequent encounter for closed fracture with routine healing: Secondary | ICD-10-CM | POA: Diagnosis not present

## 2018-12-03 DIAGNOSIS — N3281 Overactive bladder: Secondary | ICD-10-CM | POA: Diagnosis not present

## 2018-12-03 DIAGNOSIS — J449 Chronic obstructive pulmonary disease, unspecified: Secondary | ICD-10-CM | POA: Diagnosis not present

## 2018-12-03 DIAGNOSIS — Z9181 History of falling: Secondary | ICD-10-CM | POA: Diagnosis not present

## 2018-12-03 DIAGNOSIS — K5790 Diverticulosis of intestine, part unspecified, without perforation or abscess without bleeding: Secondary | ICD-10-CM | POA: Diagnosis not present

## 2018-12-03 DIAGNOSIS — D649 Anemia, unspecified: Secondary | ICD-10-CM | POA: Diagnosis not present

## 2018-12-03 DIAGNOSIS — I2510 Atherosclerotic heart disease of native coronary artery without angina pectoris: Secondary | ICD-10-CM | POA: Diagnosis not present

## 2018-12-03 DIAGNOSIS — M9711XD 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, subsequent encounter: Secondary | ICD-10-CM | POA: Diagnosis not present

## 2018-12-03 DIAGNOSIS — M810 Age-related osteoporosis without current pathological fracture: Secondary | ICD-10-CM | POA: Diagnosis not present

## 2018-12-03 DIAGNOSIS — Z87440 Personal history of urinary (tract) infections: Secondary | ICD-10-CM | POA: Diagnosis not present

## 2018-12-03 DIAGNOSIS — I10 Essential (primary) hypertension: Secondary | ICD-10-CM | POA: Diagnosis not present

## 2018-12-03 DIAGNOSIS — M1990 Unspecified osteoarthritis, unspecified site: Secondary | ICD-10-CM | POA: Diagnosis not present

## 2018-12-03 DIAGNOSIS — S72331D Displaced oblique fracture of shaft of right femur, subsequent encounter for closed fracture with routine healing: Secondary | ICD-10-CM | POA: Diagnosis not present

## 2018-12-03 DIAGNOSIS — G43909 Migraine, unspecified, not intractable, without status migrainosus: Secondary | ICD-10-CM | POA: Diagnosis not present

## 2018-12-03 DIAGNOSIS — G4733 Obstructive sleep apnea (adult) (pediatric): Secondary | ICD-10-CM | POA: Diagnosis not present

## 2018-12-03 DIAGNOSIS — E1142 Type 2 diabetes mellitus with diabetic polyneuropathy: Secondary | ICD-10-CM | POA: Diagnosis not present

## 2018-12-05 DIAGNOSIS — D649 Anemia, unspecified: Secondary | ICD-10-CM | POA: Diagnosis not present

## 2018-12-05 DIAGNOSIS — M810 Age-related osteoporosis without current pathological fracture: Secondary | ICD-10-CM | POA: Diagnosis not present

## 2018-12-05 DIAGNOSIS — G4733 Obstructive sleep apnea (adult) (pediatric): Secondary | ICD-10-CM | POA: Diagnosis not present

## 2018-12-05 DIAGNOSIS — M9711XD 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, subsequent encounter: Secondary | ICD-10-CM | POA: Diagnosis not present

## 2018-12-05 DIAGNOSIS — I10 Essential (primary) hypertension: Secondary | ICD-10-CM | POA: Diagnosis not present

## 2018-12-05 DIAGNOSIS — G43909 Migraine, unspecified, not intractable, without status migrainosus: Secondary | ICD-10-CM | POA: Diagnosis not present

## 2018-12-05 DIAGNOSIS — K5790 Diverticulosis of intestine, part unspecified, without perforation or abscess without bleeding: Secondary | ICD-10-CM | POA: Diagnosis not present

## 2018-12-05 DIAGNOSIS — J449 Chronic obstructive pulmonary disease, unspecified: Secondary | ICD-10-CM | POA: Diagnosis not present

## 2018-12-05 DIAGNOSIS — S72331D Displaced oblique fracture of shaft of right femur, subsequent encounter for closed fracture with routine healing: Secondary | ICD-10-CM | POA: Diagnosis not present

## 2018-12-05 DIAGNOSIS — M1990 Unspecified osteoarthritis, unspecified site: Secondary | ICD-10-CM | POA: Diagnosis not present

## 2018-12-05 DIAGNOSIS — I2510 Atherosclerotic heart disease of native coronary artery without angina pectoris: Secondary | ICD-10-CM | POA: Diagnosis not present

## 2018-12-05 DIAGNOSIS — Z87440 Personal history of urinary (tract) infections: Secondary | ICD-10-CM | POA: Diagnosis not present

## 2018-12-05 DIAGNOSIS — N3281 Overactive bladder: Secondary | ICD-10-CM | POA: Diagnosis not present

## 2018-12-05 DIAGNOSIS — Z9181 History of falling: Secondary | ICD-10-CM | POA: Diagnosis not present

## 2018-12-05 DIAGNOSIS — E1142 Type 2 diabetes mellitus with diabetic polyneuropathy: Secondary | ICD-10-CM | POA: Diagnosis not present

## 2018-12-06 DIAGNOSIS — I2510 Atherosclerotic heart disease of native coronary artery without angina pectoris: Secondary | ICD-10-CM | POA: Diagnosis not present

## 2018-12-06 DIAGNOSIS — K5790 Diverticulosis of intestine, part unspecified, without perforation or abscess without bleeding: Secondary | ICD-10-CM | POA: Diagnosis not present

## 2018-12-06 DIAGNOSIS — I10 Essential (primary) hypertension: Secondary | ICD-10-CM | POA: Diagnosis not present

## 2018-12-06 DIAGNOSIS — Z9181 History of falling: Secondary | ICD-10-CM | POA: Diagnosis not present

## 2018-12-06 DIAGNOSIS — G43909 Migraine, unspecified, not intractable, without status migrainosus: Secondary | ICD-10-CM | POA: Diagnosis not present

## 2018-12-06 DIAGNOSIS — Z87440 Personal history of urinary (tract) infections: Secondary | ICD-10-CM | POA: Diagnosis not present

## 2018-12-06 DIAGNOSIS — D649 Anemia, unspecified: Secondary | ICD-10-CM | POA: Diagnosis not present

## 2018-12-06 DIAGNOSIS — M9711XD 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, subsequent encounter: Secondary | ICD-10-CM | POA: Diagnosis not present

## 2018-12-06 DIAGNOSIS — M1990 Unspecified osteoarthritis, unspecified site: Secondary | ICD-10-CM | POA: Diagnosis not present

## 2018-12-06 DIAGNOSIS — G4733 Obstructive sleep apnea (adult) (pediatric): Secondary | ICD-10-CM | POA: Diagnosis not present

## 2018-12-06 DIAGNOSIS — E1142 Type 2 diabetes mellitus with diabetic polyneuropathy: Secondary | ICD-10-CM | POA: Diagnosis not present

## 2018-12-06 DIAGNOSIS — N3281 Overactive bladder: Secondary | ICD-10-CM | POA: Diagnosis not present

## 2018-12-06 DIAGNOSIS — J449 Chronic obstructive pulmonary disease, unspecified: Secondary | ICD-10-CM | POA: Diagnosis not present

## 2018-12-06 DIAGNOSIS — M810 Age-related osteoporosis without current pathological fracture: Secondary | ICD-10-CM | POA: Diagnosis not present

## 2018-12-06 DIAGNOSIS — S72331D Displaced oblique fracture of shaft of right femur, subsequent encounter for closed fracture with routine healing: Secondary | ICD-10-CM | POA: Diagnosis not present

## 2018-12-09 DIAGNOSIS — Z9181 History of falling: Secondary | ICD-10-CM | POA: Diagnosis not present

## 2018-12-09 DIAGNOSIS — M9711XD 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, subsequent encounter: Secondary | ICD-10-CM | POA: Diagnosis not present

## 2018-12-09 DIAGNOSIS — N3281 Overactive bladder: Secondary | ICD-10-CM | POA: Diagnosis not present

## 2018-12-09 DIAGNOSIS — I2510 Atherosclerotic heart disease of native coronary artery without angina pectoris: Secondary | ICD-10-CM | POA: Diagnosis not present

## 2018-12-09 DIAGNOSIS — S72331D Displaced oblique fracture of shaft of right femur, subsequent encounter for closed fracture with routine healing: Secondary | ICD-10-CM | POA: Diagnosis not present

## 2018-12-09 DIAGNOSIS — J449 Chronic obstructive pulmonary disease, unspecified: Secondary | ICD-10-CM | POA: Diagnosis not present

## 2018-12-09 DIAGNOSIS — G43909 Migraine, unspecified, not intractable, without status migrainosus: Secondary | ICD-10-CM | POA: Diagnosis not present

## 2018-12-09 DIAGNOSIS — K5790 Diverticulosis of intestine, part unspecified, without perforation or abscess without bleeding: Secondary | ICD-10-CM | POA: Diagnosis not present

## 2018-12-09 DIAGNOSIS — D649 Anemia, unspecified: Secondary | ICD-10-CM | POA: Diagnosis not present

## 2018-12-09 DIAGNOSIS — E1142 Type 2 diabetes mellitus with diabetic polyneuropathy: Secondary | ICD-10-CM | POA: Diagnosis not present

## 2018-12-09 DIAGNOSIS — M1990 Unspecified osteoarthritis, unspecified site: Secondary | ICD-10-CM | POA: Diagnosis not present

## 2018-12-09 DIAGNOSIS — M810 Age-related osteoporosis without current pathological fracture: Secondary | ICD-10-CM | POA: Diagnosis not present

## 2018-12-09 DIAGNOSIS — G4733 Obstructive sleep apnea (adult) (pediatric): Secondary | ICD-10-CM | POA: Diagnosis not present

## 2018-12-09 DIAGNOSIS — Z87440 Personal history of urinary (tract) infections: Secondary | ICD-10-CM | POA: Diagnosis not present

## 2018-12-09 DIAGNOSIS — I10 Essential (primary) hypertension: Secondary | ICD-10-CM | POA: Diagnosis not present

## 2018-12-10 DIAGNOSIS — N3281 Overactive bladder: Secondary | ICD-10-CM | POA: Diagnosis not present

## 2018-12-10 DIAGNOSIS — M1990 Unspecified osteoarthritis, unspecified site: Secondary | ICD-10-CM | POA: Diagnosis not present

## 2018-12-10 DIAGNOSIS — K5790 Diverticulosis of intestine, part unspecified, without perforation or abscess without bleeding: Secondary | ICD-10-CM | POA: Diagnosis not present

## 2018-12-10 DIAGNOSIS — Z87440 Personal history of urinary (tract) infections: Secondary | ICD-10-CM | POA: Diagnosis not present

## 2018-12-10 DIAGNOSIS — G4733 Obstructive sleep apnea (adult) (pediatric): Secondary | ICD-10-CM | POA: Diagnosis not present

## 2018-12-10 DIAGNOSIS — J449 Chronic obstructive pulmonary disease, unspecified: Secondary | ICD-10-CM | POA: Diagnosis not present

## 2018-12-10 DIAGNOSIS — S72331D Displaced oblique fracture of shaft of right femur, subsequent encounter for closed fracture with routine healing: Secondary | ICD-10-CM | POA: Diagnosis not present

## 2018-12-10 DIAGNOSIS — I2510 Atherosclerotic heart disease of native coronary artery without angina pectoris: Secondary | ICD-10-CM | POA: Diagnosis not present

## 2018-12-10 DIAGNOSIS — Z9181 History of falling: Secondary | ICD-10-CM | POA: Diagnosis not present

## 2018-12-10 DIAGNOSIS — M9711XD 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, subsequent encounter: Secondary | ICD-10-CM | POA: Diagnosis not present

## 2018-12-10 DIAGNOSIS — E1142 Type 2 diabetes mellitus with diabetic polyneuropathy: Secondary | ICD-10-CM | POA: Diagnosis not present

## 2018-12-10 DIAGNOSIS — M810 Age-related osteoporosis without current pathological fracture: Secondary | ICD-10-CM | POA: Diagnosis not present

## 2018-12-10 DIAGNOSIS — G43909 Migraine, unspecified, not intractable, without status migrainosus: Secondary | ICD-10-CM | POA: Diagnosis not present

## 2018-12-10 DIAGNOSIS — I10 Essential (primary) hypertension: Secondary | ICD-10-CM | POA: Diagnosis not present

## 2018-12-10 DIAGNOSIS — D649 Anemia, unspecified: Secondary | ICD-10-CM | POA: Diagnosis not present

## 2018-12-12 DIAGNOSIS — K5790 Diverticulosis of intestine, part unspecified, without perforation or abscess without bleeding: Secondary | ICD-10-CM | POA: Diagnosis not present

## 2018-12-12 DIAGNOSIS — G4733 Obstructive sleep apnea (adult) (pediatric): Secondary | ICD-10-CM | POA: Diagnosis not present

## 2018-12-12 DIAGNOSIS — E1142 Type 2 diabetes mellitus with diabetic polyneuropathy: Secondary | ICD-10-CM | POA: Diagnosis not present

## 2018-12-12 DIAGNOSIS — J449 Chronic obstructive pulmonary disease, unspecified: Secondary | ICD-10-CM | POA: Diagnosis not present

## 2018-12-12 DIAGNOSIS — S72331D Displaced oblique fracture of shaft of right femur, subsequent encounter for closed fracture with routine healing: Secondary | ICD-10-CM | POA: Diagnosis not present

## 2018-12-12 DIAGNOSIS — M810 Age-related osteoporosis without current pathological fracture: Secondary | ICD-10-CM | POA: Diagnosis not present

## 2018-12-12 DIAGNOSIS — Z87440 Personal history of urinary (tract) infections: Secondary | ICD-10-CM | POA: Diagnosis not present

## 2018-12-12 DIAGNOSIS — I10 Essential (primary) hypertension: Secondary | ICD-10-CM | POA: Diagnosis not present

## 2018-12-12 DIAGNOSIS — M1990 Unspecified osteoarthritis, unspecified site: Secondary | ICD-10-CM | POA: Diagnosis not present

## 2018-12-12 DIAGNOSIS — Z9181 History of falling: Secondary | ICD-10-CM | POA: Diagnosis not present

## 2018-12-12 DIAGNOSIS — D649 Anemia, unspecified: Secondary | ICD-10-CM | POA: Diagnosis not present

## 2018-12-12 DIAGNOSIS — N3281 Overactive bladder: Secondary | ICD-10-CM | POA: Diagnosis not present

## 2018-12-12 DIAGNOSIS — I2510 Atherosclerotic heart disease of native coronary artery without angina pectoris: Secondary | ICD-10-CM | POA: Diagnosis not present

## 2018-12-12 DIAGNOSIS — M9711XD 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, subsequent encounter: Secondary | ICD-10-CM | POA: Diagnosis not present

## 2018-12-12 DIAGNOSIS — G43909 Migraine, unspecified, not intractable, without status migrainosus: Secondary | ICD-10-CM | POA: Diagnosis not present

## 2018-12-13 DIAGNOSIS — M810 Age-related osteoporosis without current pathological fracture: Secondary | ICD-10-CM | POA: Diagnosis not present

## 2018-12-13 DIAGNOSIS — J449 Chronic obstructive pulmonary disease, unspecified: Secondary | ICD-10-CM | POA: Diagnosis not present

## 2018-12-13 DIAGNOSIS — M1990 Unspecified osteoarthritis, unspecified site: Secondary | ICD-10-CM | POA: Diagnosis not present

## 2018-12-13 DIAGNOSIS — G43909 Migraine, unspecified, not intractable, without status migrainosus: Secondary | ICD-10-CM | POA: Diagnosis not present

## 2018-12-13 DIAGNOSIS — N3281 Overactive bladder: Secondary | ICD-10-CM | POA: Diagnosis not present

## 2018-12-13 DIAGNOSIS — K5790 Diverticulosis of intestine, part unspecified, without perforation or abscess without bleeding: Secondary | ICD-10-CM | POA: Diagnosis not present

## 2018-12-13 DIAGNOSIS — S72331D Displaced oblique fracture of shaft of right femur, subsequent encounter for closed fracture with routine healing: Secondary | ICD-10-CM | POA: Diagnosis not present

## 2018-12-13 DIAGNOSIS — I2510 Atherosclerotic heart disease of native coronary artery without angina pectoris: Secondary | ICD-10-CM | POA: Diagnosis not present

## 2018-12-13 DIAGNOSIS — M9711XD 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, subsequent encounter: Secondary | ICD-10-CM | POA: Diagnosis not present

## 2018-12-13 DIAGNOSIS — I10 Essential (primary) hypertension: Secondary | ICD-10-CM | POA: Diagnosis not present

## 2018-12-13 DIAGNOSIS — Z87440 Personal history of urinary (tract) infections: Secondary | ICD-10-CM | POA: Diagnosis not present

## 2018-12-13 DIAGNOSIS — Z9181 History of falling: Secondary | ICD-10-CM | POA: Diagnosis not present

## 2018-12-13 DIAGNOSIS — E1142 Type 2 diabetes mellitus with diabetic polyneuropathy: Secondary | ICD-10-CM | POA: Diagnosis not present

## 2018-12-13 DIAGNOSIS — D649 Anemia, unspecified: Secondary | ICD-10-CM | POA: Diagnosis not present

## 2018-12-13 DIAGNOSIS — G4733 Obstructive sleep apnea (adult) (pediatric): Secondary | ICD-10-CM | POA: Diagnosis not present

## 2018-12-16 DIAGNOSIS — K5790 Diverticulosis of intestine, part unspecified, without perforation or abscess without bleeding: Secondary | ICD-10-CM | POA: Diagnosis not present

## 2018-12-16 DIAGNOSIS — I2510 Atherosclerotic heart disease of native coronary artery without angina pectoris: Secondary | ICD-10-CM | POA: Diagnosis not present

## 2018-12-16 DIAGNOSIS — I10 Essential (primary) hypertension: Secondary | ICD-10-CM | POA: Diagnosis not present

## 2018-12-16 DIAGNOSIS — S72331D Displaced oblique fracture of shaft of right femur, subsequent encounter for closed fracture with routine healing: Secondary | ICD-10-CM | POA: Diagnosis not present

## 2018-12-16 DIAGNOSIS — M810 Age-related osteoporosis without current pathological fracture: Secondary | ICD-10-CM | POA: Diagnosis not present

## 2018-12-16 DIAGNOSIS — G4733 Obstructive sleep apnea (adult) (pediatric): Secondary | ICD-10-CM | POA: Diagnosis not present

## 2018-12-16 DIAGNOSIS — N3281 Overactive bladder: Secondary | ICD-10-CM | POA: Diagnosis not present

## 2018-12-16 DIAGNOSIS — Z87440 Personal history of urinary (tract) infections: Secondary | ICD-10-CM | POA: Diagnosis not present

## 2018-12-16 DIAGNOSIS — E1142 Type 2 diabetes mellitus with diabetic polyneuropathy: Secondary | ICD-10-CM | POA: Diagnosis not present

## 2018-12-16 DIAGNOSIS — J449 Chronic obstructive pulmonary disease, unspecified: Secondary | ICD-10-CM | POA: Diagnosis not present

## 2018-12-16 DIAGNOSIS — Z9181 History of falling: Secondary | ICD-10-CM | POA: Diagnosis not present

## 2018-12-16 DIAGNOSIS — D649 Anemia, unspecified: Secondary | ICD-10-CM | POA: Diagnosis not present

## 2018-12-16 DIAGNOSIS — M1990 Unspecified osteoarthritis, unspecified site: Secondary | ICD-10-CM | POA: Diagnosis not present

## 2018-12-16 DIAGNOSIS — M9711XD 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, subsequent encounter: Secondary | ICD-10-CM | POA: Diagnosis not present

## 2018-12-16 DIAGNOSIS — G43909 Migraine, unspecified, not intractable, without status migrainosus: Secondary | ICD-10-CM | POA: Diagnosis not present

## 2018-12-18 DIAGNOSIS — E1142 Type 2 diabetes mellitus with diabetic polyneuropathy: Secondary | ICD-10-CM | POA: Diagnosis not present

## 2018-12-18 DIAGNOSIS — Z9181 History of falling: Secondary | ICD-10-CM | POA: Diagnosis not present

## 2018-12-18 DIAGNOSIS — I2510 Atherosclerotic heart disease of native coronary artery without angina pectoris: Secondary | ICD-10-CM | POA: Diagnosis not present

## 2018-12-18 DIAGNOSIS — G43909 Migraine, unspecified, not intractable, without status migrainosus: Secondary | ICD-10-CM | POA: Diagnosis not present

## 2018-12-18 DIAGNOSIS — M810 Age-related osteoporosis without current pathological fracture: Secondary | ICD-10-CM | POA: Diagnosis not present

## 2018-12-18 DIAGNOSIS — I10 Essential (primary) hypertension: Secondary | ICD-10-CM | POA: Diagnosis not present

## 2018-12-18 DIAGNOSIS — D649 Anemia, unspecified: Secondary | ICD-10-CM | POA: Diagnosis not present

## 2018-12-18 DIAGNOSIS — Z87440 Personal history of urinary (tract) infections: Secondary | ICD-10-CM | POA: Diagnosis not present

## 2018-12-18 DIAGNOSIS — S72331D Displaced oblique fracture of shaft of right femur, subsequent encounter for closed fracture with routine healing: Secondary | ICD-10-CM | POA: Diagnosis not present

## 2018-12-18 DIAGNOSIS — J449 Chronic obstructive pulmonary disease, unspecified: Secondary | ICD-10-CM | POA: Diagnosis not present

## 2018-12-18 DIAGNOSIS — M9711XD 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, subsequent encounter: Secondary | ICD-10-CM | POA: Diagnosis not present

## 2018-12-18 DIAGNOSIS — N3281 Overactive bladder: Secondary | ICD-10-CM | POA: Diagnosis not present

## 2018-12-18 DIAGNOSIS — M1990 Unspecified osteoarthritis, unspecified site: Secondary | ICD-10-CM | POA: Diagnosis not present

## 2018-12-18 DIAGNOSIS — G4733 Obstructive sleep apnea (adult) (pediatric): Secondary | ICD-10-CM | POA: Diagnosis not present

## 2018-12-18 DIAGNOSIS — K5790 Diverticulosis of intestine, part unspecified, without perforation or abscess without bleeding: Secondary | ICD-10-CM | POA: Diagnosis not present

## 2018-12-23 DIAGNOSIS — S72331D Displaced oblique fracture of shaft of right femur, subsequent encounter for closed fracture with routine healing: Secondary | ICD-10-CM | POA: Diagnosis not present

## 2018-12-23 DIAGNOSIS — D649 Anemia, unspecified: Secondary | ICD-10-CM | POA: Diagnosis not present

## 2018-12-23 DIAGNOSIS — M9711XD 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, subsequent encounter: Secondary | ICD-10-CM | POA: Diagnosis not present

## 2018-12-23 DIAGNOSIS — I10 Essential (primary) hypertension: Secondary | ICD-10-CM | POA: Diagnosis not present

## 2018-12-23 DIAGNOSIS — Z9181 History of falling: Secondary | ICD-10-CM | POA: Diagnosis not present

## 2018-12-23 DIAGNOSIS — G4733 Obstructive sleep apnea (adult) (pediatric): Secondary | ICD-10-CM | POA: Diagnosis not present

## 2018-12-23 DIAGNOSIS — M1990 Unspecified osteoarthritis, unspecified site: Secondary | ICD-10-CM | POA: Diagnosis not present

## 2018-12-23 DIAGNOSIS — E1142 Type 2 diabetes mellitus with diabetic polyneuropathy: Secondary | ICD-10-CM | POA: Diagnosis not present

## 2018-12-23 DIAGNOSIS — N3281 Overactive bladder: Secondary | ICD-10-CM | POA: Diagnosis not present

## 2018-12-23 DIAGNOSIS — K5790 Diverticulosis of intestine, part unspecified, without perforation or abscess without bleeding: Secondary | ICD-10-CM | POA: Diagnosis not present

## 2018-12-23 DIAGNOSIS — J449 Chronic obstructive pulmonary disease, unspecified: Secondary | ICD-10-CM | POA: Diagnosis not present

## 2018-12-23 DIAGNOSIS — M810 Age-related osteoporosis without current pathological fracture: Secondary | ICD-10-CM | POA: Diagnosis not present

## 2018-12-23 DIAGNOSIS — I2510 Atherosclerotic heart disease of native coronary artery without angina pectoris: Secondary | ICD-10-CM | POA: Diagnosis not present

## 2018-12-23 DIAGNOSIS — Z87440 Personal history of urinary (tract) infections: Secondary | ICD-10-CM | POA: Diagnosis not present

## 2018-12-23 DIAGNOSIS — G43909 Migraine, unspecified, not intractable, without status migrainosus: Secondary | ICD-10-CM | POA: Diagnosis not present

## 2018-12-27 DIAGNOSIS — J449 Chronic obstructive pulmonary disease, unspecified: Secondary | ICD-10-CM | POA: Diagnosis not present

## 2018-12-27 DIAGNOSIS — N3281 Overactive bladder: Secondary | ICD-10-CM | POA: Diagnosis not present

## 2018-12-27 DIAGNOSIS — I2510 Atherosclerotic heart disease of native coronary artery without angina pectoris: Secondary | ICD-10-CM | POA: Diagnosis not present

## 2018-12-27 DIAGNOSIS — S72331D Displaced oblique fracture of shaft of right femur, subsequent encounter for closed fracture with routine healing: Secondary | ICD-10-CM | POA: Diagnosis not present

## 2018-12-27 DIAGNOSIS — G43909 Migraine, unspecified, not intractable, without status migrainosus: Secondary | ICD-10-CM | POA: Diagnosis not present

## 2018-12-27 DIAGNOSIS — M1990 Unspecified osteoarthritis, unspecified site: Secondary | ICD-10-CM | POA: Diagnosis not present

## 2018-12-27 DIAGNOSIS — K5790 Diverticulosis of intestine, part unspecified, without perforation or abscess without bleeding: Secondary | ICD-10-CM | POA: Diagnosis not present

## 2018-12-27 DIAGNOSIS — Z9181 History of falling: Secondary | ICD-10-CM | POA: Diagnosis not present

## 2018-12-27 DIAGNOSIS — I10 Essential (primary) hypertension: Secondary | ICD-10-CM | POA: Diagnosis not present

## 2018-12-27 DIAGNOSIS — D649 Anemia, unspecified: Secondary | ICD-10-CM | POA: Diagnosis not present

## 2018-12-27 DIAGNOSIS — G4733 Obstructive sleep apnea (adult) (pediatric): Secondary | ICD-10-CM | POA: Diagnosis not present

## 2018-12-27 DIAGNOSIS — E1142 Type 2 diabetes mellitus with diabetic polyneuropathy: Secondary | ICD-10-CM | POA: Diagnosis not present

## 2018-12-27 DIAGNOSIS — M810 Age-related osteoporosis without current pathological fracture: Secondary | ICD-10-CM | POA: Diagnosis not present

## 2018-12-27 DIAGNOSIS — M9711XD 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, subsequent encounter: Secondary | ICD-10-CM | POA: Diagnosis not present

## 2018-12-27 DIAGNOSIS — Z87440 Personal history of urinary (tract) infections: Secondary | ICD-10-CM | POA: Diagnosis not present

## 2018-12-30 DIAGNOSIS — Z87440 Personal history of urinary (tract) infections: Secondary | ICD-10-CM | POA: Diagnosis not present

## 2018-12-30 DIAGNOSIS — M1990 Unspecified osteoarthritis, unspecified site: Secondary | ICD-10-CM | POA: Diagnosis not present

## 2018-12-30 DIAGNOSIS — M810 Age-related osteoporosis without current pathological fracture: Secondary | ICD-10-CM | POA: Diagnosis not present

## 2018-12-30 DIAGNOSIS — I10 Essential (primary) hypertension: Secondary | ICD-10-CM | POA: Diagnosis not present

## 2018-12-30 DIAGNOSIS — E1142 Type 2 diabetes mellitus with diabetic polyneuropathy: Secondary | ICD-10-CM | POA: Diagnosis not present

## 2018-12-30 DIAGNOSIS — N3281 Overactive bladder: Secondary | ICD-10-CM | POA: Diagnosis not present

## 2018-12-30 DIAGNOSIS — D649 Anemia, unspecified: Secondary | ICD-10-CM | POA: Diagnosis not present

## 2018-12-30 DIAGNOSIS — M9711XD 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, subsequent encounter: Secondary | ICD-10-CM | POA: Diagnosis not present

## 2018-12-30 DIAGNOSIS — G43909 Migraine, unspecified, not intractable, without status migrainosus: Secondary | ICD-10-CM | POA: Diagnosis not present

## 2018-12-30 DIAGNOSIS — J449 Chronic obstructive pulmonary disease, unspecified: Secondary | ICD-10-CM | POA: Diagnosis not present

## 2018-12-30 DIAGNOSIS — G4733 Obstructive sleep apnea (adult) (pediatric): Secondary | ICD-10-CM | POA: Diagnosis not present

## 2018-12-30 DIAGNOSIS — K5790 Diverticulosis of intestine, part unspecified, without perforation or abscess without bleeding: Secondary | ICD-10-CM | POA: Diagnosis not present

## 2018-12-30 DIAGNOSIS — Z9181 History of falling: Secondary | ICD-10-CM | POA: Diagnosis not present

## 2018-12-30 DIAGNOSIS — S72331D Displaced oblique fracture of shaft of right femur, subsequent encounter for closed fracture with routine healing: Secondary | ICD-10-CM | POA: Diagnosis not present

## 2018-12-30 DIAGNOSIS — I2510 Atherosclerotic heart disease of native coronary artery without angina pectoris: Secondary | ICD-10-CM | POA: Diagnosis not present

## 2019-01-06 DIAGNOSIS — G4733 Obstructive sleep apnea (adult) (pediatric): Secondary | ICD-10-CM | POA: Diagnosis not present

## 2019-01-06 DIAGNOSIS — N3281 Overactive bladder: Secondary | ICD-10-CM | POA: Diagnosis not present

## 2019-01-06 DIAGNOSIS — Z9181 History of falling: Secondary | ICD-10-CM | POA: Diagnosis not present

## 2019-01-06 DIAGNOSIS — Z87440 Personal history of urinary (tract) infections: Secondary | ICD-10-CM | POA: Diagnosis not present

## 2019-01-06 DIAGNOSIS — K5790 Diverticulosis of intestine, part unspecified, without perforation or abscess without bleeding: Secondary | ICD-10-CM | POA: Diagnosis not present

## 2019-01-06 DIAGNOSIS — S72331D Displaced oblique fracture of shaft of right femur, subsequent encounter for closed fracture with routine healing: Secondary | ICD-10-CM | POA: Diagnosis not present

## 2019-01-06 DIAGNOSIS — M9711XD 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, subsequent encounter: Secondary | ICD-10-CM | POA: Diagnosis not present

## 2019-01-06 DIAGNOSIS — G43909 Migraine, unspecified, not intractable, without status migrainosus: Secondary | ICD-10-CM | POA: Diagnosis not present

## 2019-01-06 DIAGNOSIS — J449 Chronic obstructive pulmonary disease, unspecified: Secondary | ICD-10-CM | POA: Diagnosis not present

## 2019-01-06 DIAGNOSIS — E1142 Type 2 diabetes mellitus with diabetic polyneuropathy: Secondary | ICD-10-CM | POA: Diagnosis not present

## 2019-01-06 DIAGNOSIS — D649 Anemia, unspecified: Secondary | ICD-10-CM | POA: Diagnosis not present

## 2019-01-06 DIAGNOSIS — I10 Essential (primary) hypertension: Secondary | ICD-10-CM | POA: Diagnosis not present

## 2019-01-06 DIAGNOSIS — I2510 Atherosclerotic heart disease of native coronary artery without angina pectoris: Secondary | ICD-10-CM | POA: Diagnosis not present

## 2019-01-06 DIAGNOSIS — M1990 Unspecified osteoarthritis, unspecified site: Secondary | ICD-10-CM | POA: Diagnosis not present

## 2019-01-06 DIAGNOSIS — M810 Age-related osteoporosis without current pathological fracture: Secondary | ICD-10-CM | POA: Diagnosis not present

## 2019-01-13 DIAGNOSIS — M9711XD 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, subsequent encounter: Secondary | ICD-10-CM | POA: Diagnosis not present

## 2019-01-14 DIAGNOSIS — M1990 Unspecified osteoarthritis, unspecified site: Secondary | ICD-10-CM | POA: Diagnosis not present

## 2019-01-14 DIAGNOSIS — M9711XD 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, subsequent encounter: Secondary | ICD-10-CM | POA: Diagnosis not present

## 2019-01-14 DIAGNOSIS — Z9181 History of falling: Secondary | ICD-10-CM | POA: Diagnosis not present

## 2019-01-14 DIAGNOSIS — I2510 Atherosclerotic heart disease of native coronary artery without angina pectoris: Secondary | ICD-10-CM | POA: Diagnosis not present

## 2019-01-14 DIAGNOSIS — I10 Essential (primary) hypertension: Secondary | ICD-10-CM | POA: Diagnosis not present

## 2019-01-14 DIAGNOSIS — K5790 Diverticulosis of intestine, part unspecified, without perforation or abscess without bleeding: Secondary | ICD-10-CM | POA: Diagnosis not present

## 2019-01-14 DIAGNOSIS — D649 Anemia, unspecified: Secondary | ICD-10-CM | POA: Diagnosis not present

## 2019-01-14 DIAGNOSIS — M810 Age-related osteoporosis without current pathological fracture: Secondary | ICD-10-CM | POA: Diagnosis not present

## 2019-01-14 DIAGNOSIS — Z87440 Personal history of urinary (tract) infections: Secondary | ICD-10-CM | POA: Diagnosis not present

## 2019-01-14 DIAGNOSIS — S72331D Displaced oblique fracture of shaft of right femur, subsequent encounter for closed fracture with routine healing: Secondary | ICD-10-CM | POA: Diagnosis not present

## 2019-01-14 DIAGNOSIS — N3281 Overactive bladder: Secondary | ICD-10-CM | POA: Diagnosis not present

## 2019-01-14 DIAGNOSIS — E1142 Type 2 diabetes mellitus with diabetic polyneuropathy: Secondary | ICD-10-CM | POA: Diagnosis not present

## 2019-01-14 DIAGNOSIS — G4733 Obstructive sleep apnea (adult) (pediatric): Secondary | ICD-10-CM | POA: Diagnosis not present

## 2019-01-14 DIAGNOSIS — G43909 Migraine, unspecified, not intractable, without status migrainosus: Secondary | ICD-10-CM | POA: Diagnosis not present

## 2019-01-14 DIAGNOSIS — J449 Chronic obstructive pulmonary disease, unspecified: Secondary | ICD-10-CM | POA: Diagnosis not present

## 2019-02-20 IMAGING — DX DG WRIST COMPLETE 3+V*R*
3 series · 3 of 3 positions shown · non-contrast
Comparison: Right wrist radiograph dated 11/25/2014

CLINICAL DATA: 71-year-old female with fall and right wrist pain.

EXAM:
RIGHT WRIST - COMPLETE 3+ VIEW

[wrist pa]
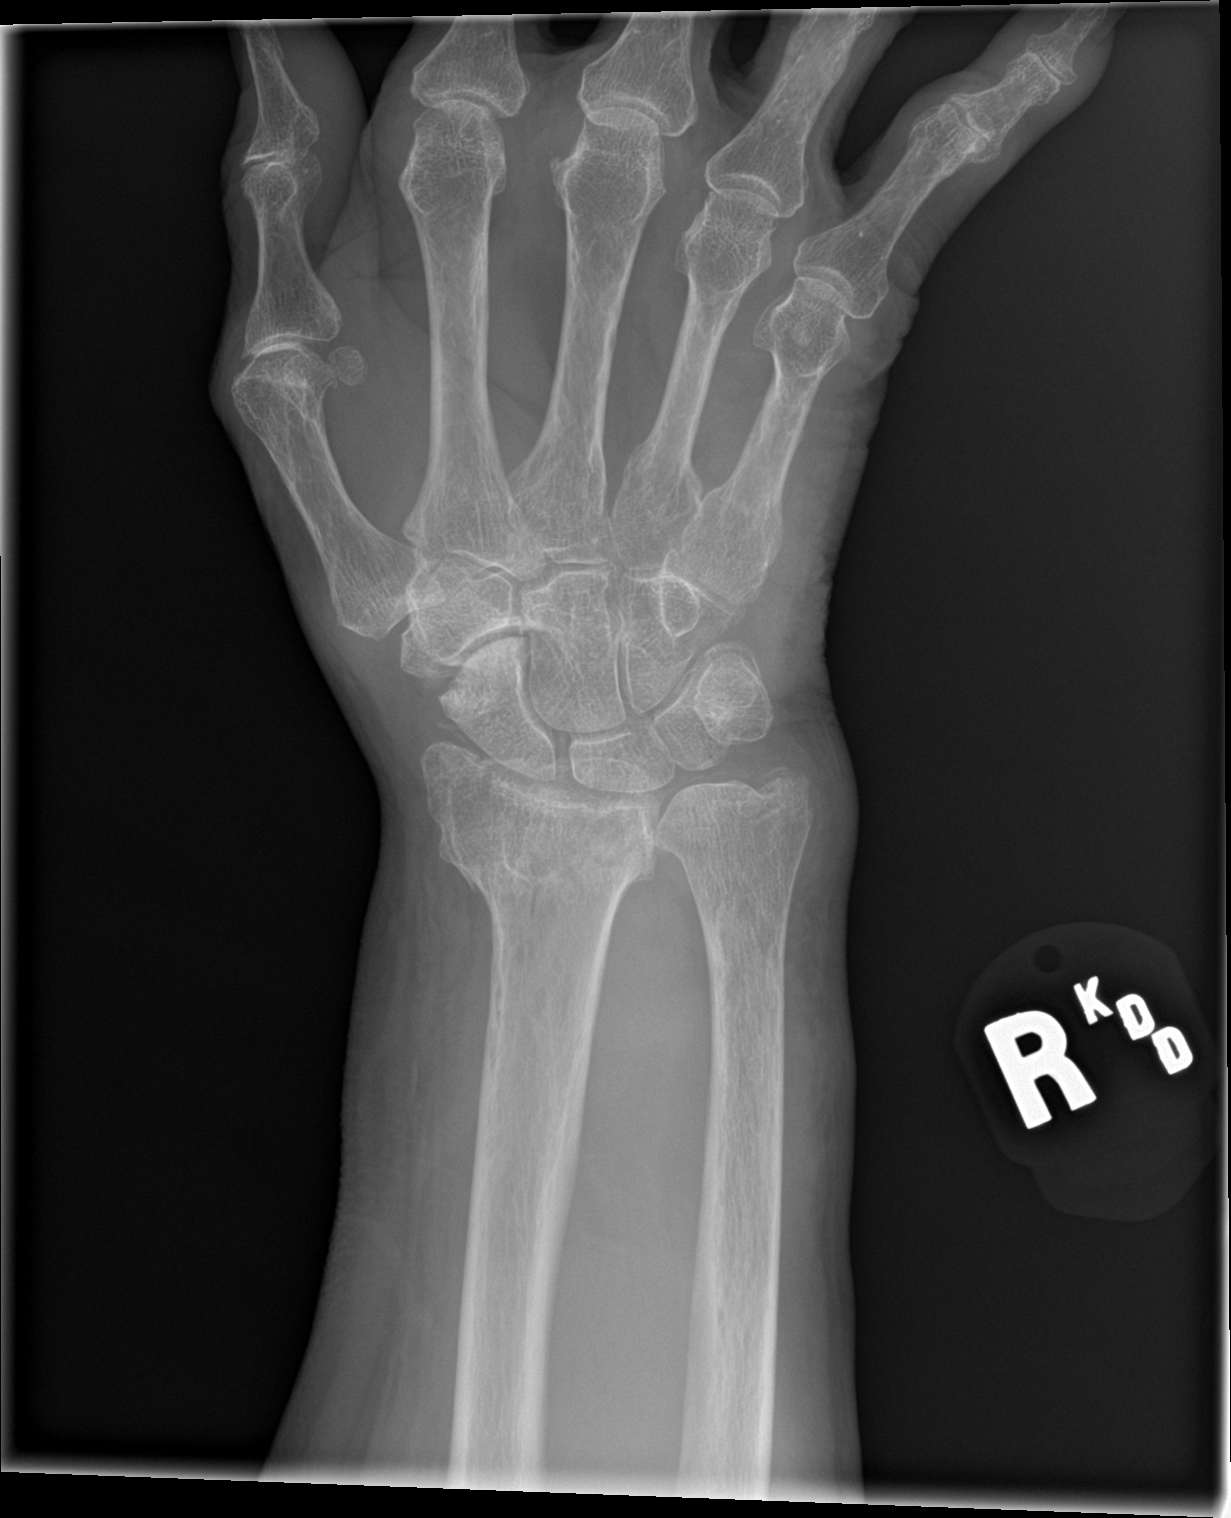

[wrist obl]
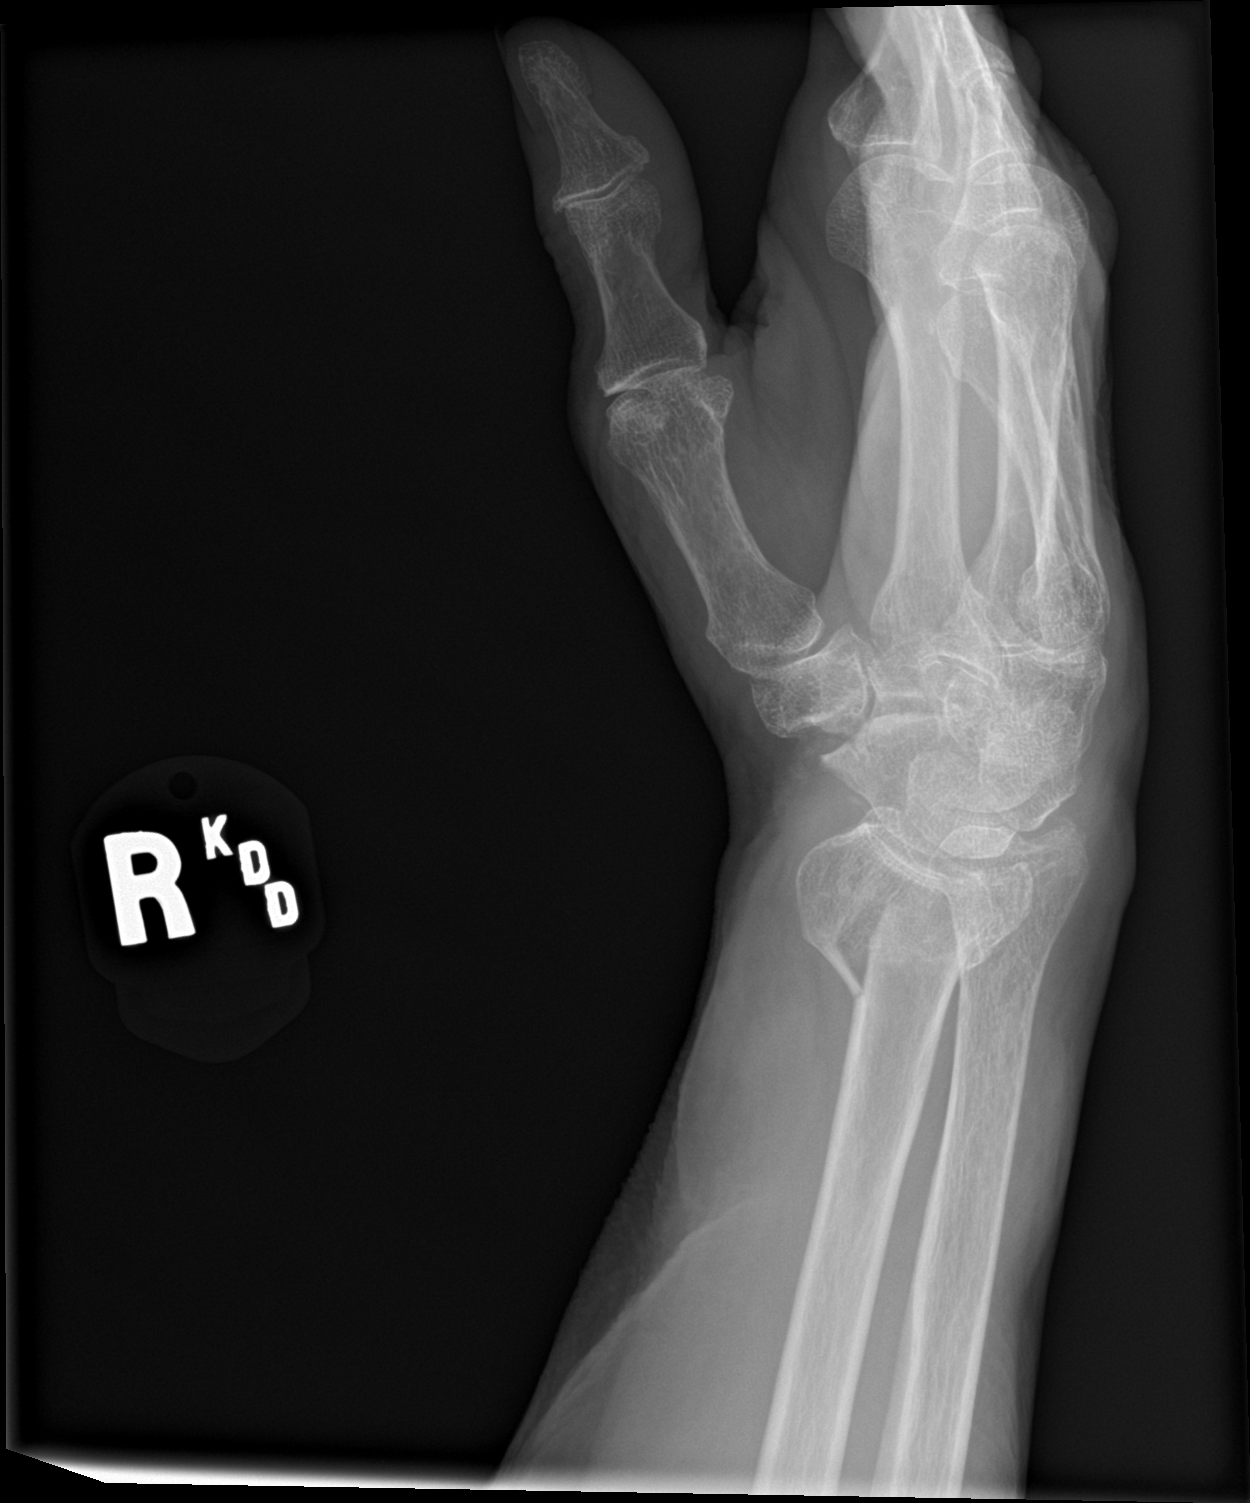

[wrist lat]
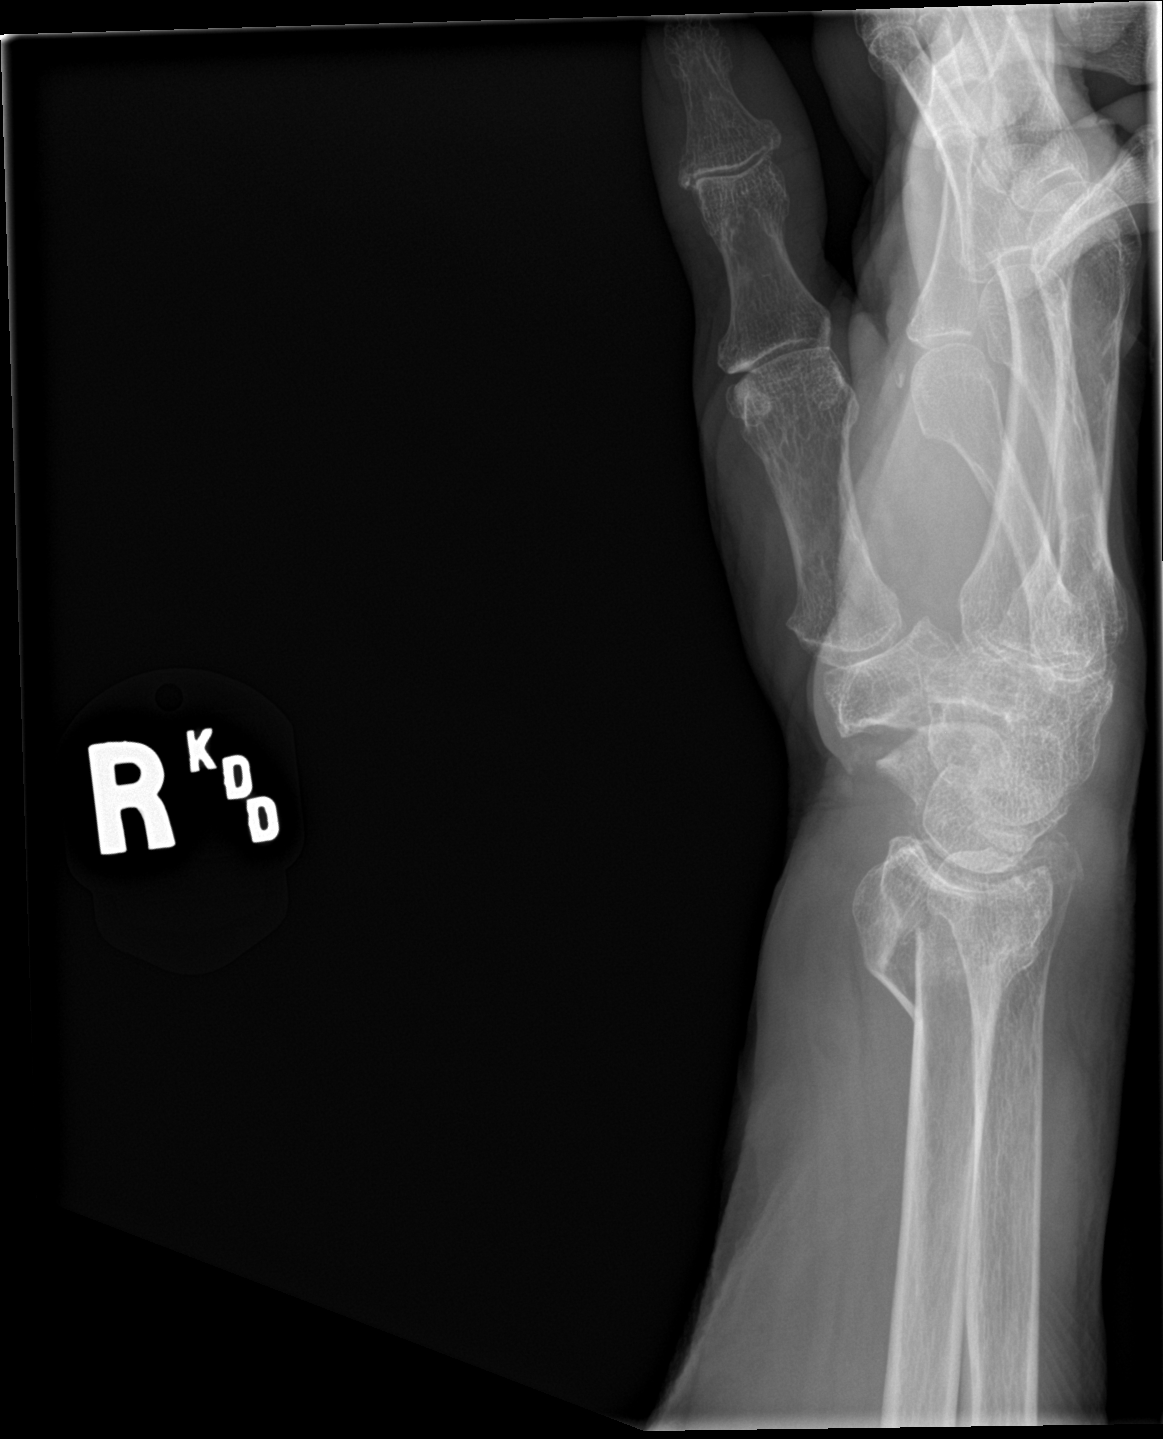

[3 of 3 positions shown; findings below may reference images not displayed]

FINDINGS: There is a fracture of the distal radius with volar angulation of
the distal fracture fragment. There is extension of the fracture
into the medial articular surface. No dislocation. The bones are
osteopenic. There is soft tissue swelling of the wrist.
IMPRESSION: Inter articular, comminuted appearing fracture of the distal radius
with volar angulation of the distal fracture fragment. No
dislocation.

## 2019-02-21 DIAGNOSIS — E1121 Type 2 diabetes mellitus with diabetic nephropathy: Secondary | ICD-10-CM | POA: Diagnosis not present

## 2019-02-21 DIAGNOSIS — I10 Essential (primary) hypertension: Secondary | ICD-10-CM | POA: Diagnosis not present

## 2019-02-21 DIAGNOSIS — E559 Vitamin D deficiency, unspecified: Secondary | ICD-10-CM | POA: Diagnosis not present

## 2019-02-21 DIAGNOSIS — E1140 Type 2 diabetes mellitus with diabetic neuropathy, unspecified: Secondary | ICD-10-CM | POA: Diagnosis not present

## 2019-02-21 DIAGNOSIS — D519 Vitamin B12 deficiency anemia, unspecified: Secondary | ICD-10-CM | POA: Diagnosis not present

## 2019-02-21 DIAGNOSIS — E119 Type 2 diabetes mellitus without complications: Secondary | ICD-10-CM | POA: Diagnosis not present

## 2019-02-26 DIAGNOSIS — E1121 Type 2 diabetes mellitus with diabetic nephropathy: Secondary | ICD-10-CM | POA: Diagnosis not present

## 2019-02-26 DIAGNOSIS — E782 Mixed hyperlipidemia: Secondary | ICD-10-CM | POA: Diagnosis not present

## 2019-02-26 DIAGNOSIS — D509 Iron deficiency anemia, unspecified: Secondary | ICD-10-CM | POA: Diagnosis not present

## 2019-02-26 DIAGNOSIS — I10 Essential (primary) hypertension: Secondary | ICD-10-CM | POA: Diagnosis not present

## 2019-02-26 DIAGNOSIS — K219 Gastro-esophageal reflux disease without esophagitis: Secondary | ICD-10-CM | POA: Diagnosis not present

## 2019-02-28 DIAGNOSIS — I10 Essential (primary) hypertension: Secondary | ICD-10-CM | POA: Diagnosis not present

## 2019-02-28 DIAGNOSIS — E1169 Type 2 diabetes mellitus with other specified complication: Secondary | ICD-10-CM | POA: Diagnosis not present

## 2019-02-28 DIAGNOSIS — R350 Frequency of micturition: Secondary | ICD-10-CM | POA: Diagnosis not present

## 2019-02-28 DIAGNOSIS — J449 Chronic obstructive pulmonary disease, unspecified: Secondary | ICD-10-CM | POA: Diagnosis not present

## 2019-02-28 DIAGNOSIS — E782 Mixed hyperlipidemia: Secondary | ICD-10-CM | POA: Diagnosis not present

## 2019-04-01 DIAGNOSIS — E1121 Type 2 diabetes mellitus with diabetic nephropathy: Secondary | ICD-10-CM | POA: Diagnosis not present

## 2019-04-01 DIAGNOSIS — E782 Mixed hyperlipidemia: Secondary | ICD-10-CM | POA: Diagnosis not present

## 2019-04-01 DIAGNOSIS — D509 Iron deficiency anemia, unspecified: Secondary | ICD-10-CM | POA: Diagnosis not present

## 2019-04-01 DIAGNOSIS — K219 Gastro-esophageal reflux disease without esophagitis: Secondary | ICD-10-CM | POA: Diagnosis not present

## 2019-04-01 DIAGNOSIS — I10 Essential (primary) hypertension: Secondary | ICD-10-CM | POA: Diagnosis not present

## 2019-04-22 DIAGNOSIS — M9711XD 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, subsequent encounter: Secondary | ICD-10-CM | POA: Diagnosis not present

## 2019-04-25 DIAGNOSIS — Z0000 Encounter for general adult medical examination without abnormal findings: Secondary | ICD-10-CM | POA: Diagnosis not present

## 2019-04-29 ENCOUNTER — Other Ambulatory Visit (HOSPITAL_COMMUNITY): Payer: Self-pay | Admitting: Internal Medicine

## 2019-04-29 DIAGNOSIS — Z1231 Encounter for screening mammogram for malignant neoplasm of breast: Secondary | ICD-10-CM

## 2019-04-30 DIAGNOSIS — E1121 Type 2 diabetes mellitus with diabetic nephropathy: Secondary | ICD-10-CM | POA: Diagnosis not present

## 2019-04-30 DIAGNOSIS — K219 Gastro-esophageal reflux disease without esophagitis: Secondary | ICD-10-CM | POA: Diagnosis not present

## 2019-04-30 DIAGNOSIS — I10 Essential (primary) hypertension: Secondary | ICD-10-CM | POA: Diagnosis not present

## 2019-04-30 DIAGNOSIS — E782 Mixed hyperlipidemia: Secondary | ICD-10-CM | POA: Diagnosis not present

## 2019-05-01 ENCOUNTER — Other Ambulatory Visit: Payer: Self-pay

## 2019-05-01 ENCOUNTER — Ambulatory Visit (HOSPITAL_COMMUNITY)
Admission: RE | Admit: 2019-05-01 | Discharge: 2019-05-01 | Disposition: A | Discharge status: Home / Self Care | Payer: Medicare Other | Source: Ambulatory Visit | Attending: Internal Medicine | Admitting: Internal Medicine

## 2019-05-01 DIAGNOSIS — Z1231 Encounter for screening mammogram for malignant neoplasm of breast: Secondary | ICD-10-CM | POA: Diagnosis not present

## 2019-05-27 ENCOUNTER — Other Ambulatory Visit: Payer: Self-pay

## 2019-05-27 NOTE — Patient Outreach (Signed)
Incoming EMMI campaign call. Patient will be referred to the telephonic team.

## 2019-06-03 ENCOUNTER — Other Ambulatory Visit: Payer: Self-pay | Admitting: *Deleted

## 2019-06-03 ENCOUNTER — Encounter: Payer: Self-pay | Admitting: *Deleted

## 2019-06-03 DIAGNOSIS — K219 Gastro-esophageal reflux disease without esophagitis: Secondary | ICD-10-CM | POA: Diagnosis not present

## 2019-06-03 DIAGNOSIS — E782 Mixed hyperlipidemia: Secondary | ICD-10-CM | POA: Diagnosis not present

## 2019-06-03 DIAGNOSIS — I10 Essential (primary) hypertension: Secondary | ICD-10-CM | POA: Diagnosis not present

## 2019-06-03 DIAGNOSIS — E1121 Type 2 diabetes mellitus with diabetic nephropathy: Secondary | ICD-10-CM | POA: Diagnosis not present

## 2019-06-03 NOTE — Patient Outreach (Signed)
Alexis Rios) Care Management  06/03/2019  Alexis Rios 01-15-1946 [id number]   Subjective: Telephone call to patient's home / mobile number, spoke with patient, and HIPAA verified.  Discussed [place] EMMI Prevent Call follow up, patient voiced understanding, and is in agreement to follow up.  Patient states she is doing okay, does not have personal care, or housekeeping needs for others outside of her family.  States her husband assist with activities of daily living/ home management when he wishes to assist and granddaughter will assist with some housekeeping as patient request.  Patient declined referral to [place] Management Social Worker for personal care or housekeeping community resources at this time, will access if needed in the future.   Depression screening results discussed, patient states she has a history of depression, MD is aware of her signs/ symptoms, currently taking medications, and has psychiatrist that she sees on a regular basis.  Patient states she is aware of signs/ symptoms to report, how to reach provider if needed after hours, when to go to ED, and / or call 911.  Patient declined referral to [place] Management Social Worker for depression screening follow up and / or behavioral health community resources at this time, will access if needed in the future.  States she is waiting for a call back from primary MD's office to schedule her next vitamin B12 shot, has a follow up appointment for lab work on 06/24/2019, and next office visit is 07/01/2019.  States she managing pain in her own way and does not like to take pain medications.  Patient states she is able to manage some self care and has assistance as needed when husband is willing to provide.  States her husband has to provide assistance on his own time, not a mean person, he will provide assistance, may not be on her time, which cause her to be irritated at times, and she is  managing this issue as needed.   Patient states she may discuss coping strategies with MD and/ or psychiatrist as needed. Patient voices understanding of medical diagnosis and treatment plan.  States she also has a histoy of skin cancer.  Patient declined referral to [place] or [facility] for disease education or monitoring and will access if needed in the future.  Patient states she does not have any education material, transition of care, care coordination, disease management, disease monitoring, transportation, community resource, or pharmacy needs at this time.  States she is very appreciative of the follow up and is in agreement to receive [place] Management information.     Objective: Per KPN (Knowledge Performance Now, point of care tool) and chart review, patient has had no recent hospitalizations or ED visits.   Patient has a history of the following: COPD, diabetes (diet controlled), hypertension, IBS, CAD, Asthma, and UTI.     Assessment: Received [employer] [facility] EMMI Prevent Call follow up referral on 05/27/2019.  Referral reason: Engagement Tool completed, diagnosis diabetes, cancer, may need housekeeping, and personal care assistance.   Screening follow up completed and patient declined care management needs.     Plan: RNCM will send patient successful outreach letter, [facility] pamphlet, and magnet. Case closure due to patient declining [facility] Care Management services.  RNCM will send MD case closure letter.      Alexis Rios, BSN, [place] Management [place] Rios Telephonic CM Phone: [phone] Fax: [phone]

## 2019-06-24 DIAGNOSIS — D519 Vitamin B12 deficiency anemia, unspecified: Secondary | ICD-10-CM | POA: Diagnosis not present

## 2019-06-24 DIAGNOSIS — E1121 Type 2 diabetes mellitus with diabetic nephropathy: Secondary | ICD-10-CM | POA: Diagnosis not present

## 2019-06-24 DIAGNOSIS — D509 Iron deficiency anemia, unspecified: Secondary | ICD-10-CM | POA: Diagnosis not present

## 2019-06-24 DIAGNOSIS — K219 Gastro-esophageal reflux disease without esophagitis: Secondary | ICD-10-CM | POA: Diagnosis not present

## 2019-06-24 DIAGNOSIS — I10 Essential (primary) hypertension: Secondary | ICD-10-CM | POA: Diagnosis not present

## 2019-06-24 DIAGNOSIS — E559 Vitamin D deficiency, unspecified: Secondary | ICD-10-CM | POA: Diagnosis not present

## 2019-06-24 DIAGNOSIS — E782 Mixed hyperlipidemia: Secondary | ICD-10-CM | POA: Diagnosis not present

## 2019-07-01 DIAGNOSIS — E782 Mixed hyperlipidemia: Secondary | ICD-10-CM | POA: Diagnosis not present

## 2019-07-01 DIAGNOSIS — M1712 Unilateral primary osteoarthritis, left knee: Secondary | ICD-10-CM | POA: Diagnosis not present

## 2019-07-01 DIAGNOSIS — E1169 Type 2 diabetes mellitus with other specified complication: Secondary | ICD-10-CM | POA: Diagnosis not present

## 2019-07-01 DIAGNOSIS — R350 Frequency of micturition: Secondary | ICD-10-CM | POA: Diagnosis not present

## 2019-07-01 DIAGNOSIS — J449 Chronic obstructive pulmonary disease, unspecified: Secondary | ICD-10-CM | POA: Diagnosis not present

## 2019-07-01 DIAGNOSIS — M25562 Pain in left knee: Secondary | ICD-10-CM | POA: Diagnosis not present

## 2019-07-01 DIAGNOSIS — I10 Essential (primary) hypertension: Secondary | ICD-10-CM | POA: Diagnosis not present

## 2019-07-01 DIAGNOSIS — M9711XD 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, subsequent encounter: Secondary | ICD-10-CM | POA: Diagnosis not present

## 2019-07-02 ENCOUNTER — Other Ambulatory Visit: Payer: Self-pay | Admitting: Orthopedic Surgery

## 2019-07-02 DIAGNOSIS — M9711XD 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, subsequent encounter: Secondary | ICD-10-CM

## 2019-07-11 ENCOUNTER — Ambulatory Visit
Admission: RE | Admit: 2019-07-11 | Discharge: 2019-07-11 | Disposition: A | Discharge status: Home / Self Care | Payer: Medicare Other | Source: Ambulatory Visit | Attending: Orthopedic Surgery | Admitting: Orthopedic Surgery

## 2019-07-11 DIAGNOSIS — M9711XD 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, subsequent encounter: Secondary | ICD-10-CM | POA: Diagnosis not present

## 2019-07-22 DIAGNOSIS — M9711XD 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, subsequent encounter: Secondary | ICD-10-CM | POA: Diagnosis not present

## 2019-07-29 DIAGNOSIS — E1169 Type 2 diabetes mellitus with other specified complication: Secondary | ICD-10-CM | POA: Diagnosis not present

## 2019-07-29 DIAGNOSIS — E782 Mixed hyperlipidemia: Secondary | ICD-10-CM | POA: Diagnosis not present

## 2019-07-29 DIAGNOSIS — R350 Frequency of micturition: Secondary | ICD-10-CM | POA: Diagnosis not present

## 2019-07-29 DIAGNOSIS — I10 Essential (primary) hypertension: Secondary | ICD-10-CM | POA: Diagnosis not present

## 2019-07-29 DIAGNOSIS — J449 Chronic obstructive pulmonary disease, unspecified: Secondary | ICD-10-CM | POA: Diagnosis not present

## 2019-08-22 DIAGNOSIS — H6501 Acute serous otitis media, right ear: Secondary | ICD-10-CM | POA: Diagnosis not present

## 2019-08-22 DIAGNOSIS — J309 Allergic rhinitis, unspecified: Secondary | ICD-10-CM | POA: Diagnosis not present

## 2019-08-26 DIAGNOSIS — Z96641 Presence of right artificial hip joint: Secondary | ICD-10-CM | POA: Diagnosis not present

## 2019-08-26 DIAGNOSIS — Z96651 Presence of right artificial knee joint: Secondary | ICD-10-CM | POA: Diagnosis not present

## 2019-08-26 DIAGNOSIS — S72461K Displaced supracondylar fracture with intracondylar extension of lower end of right femur, subsequent encounter for closed fracture with nonunion: Secondary | ICD-10-CM | POA: Diagnosis not present

## 2019-08-26 DIAGNOSIS — M9701XD Periprosthetic fracture around internal prosthetic right hip joint, subsequent encounter: Secondary | ICD-10-CM | POA: Diagnosis not present

## 2019-08-26 DIAGNOSIS — M9701XA Periprosthetic fracture around internal prosthetic right hip joint, initial encounter: Secondary | ICD-10-CM | POA: Diagnosis not present

## 2019-08-27 DIAGNOSIS — J449 Chronic obstructive pulmonary disease, unspecified: Secondary | ICD-10-CM | POA: Diagnosis not present

## 2019-08-27 DIAGNOSIS — E782 Mixed hyperlipidemia: Secondary | ICD-10-CM | POA: Diagnosis not present

## 2019-08-27 DIAGNOSIS — R350 Frequency of micturition: Secondary | ICD-10-CM | POA: Diagnosis not present

## 2019-08-27 DIAGNOSIS — E1169 Type 2 diabetes mellitus with other specified complication: Secondary | ICD-10-CM | POA: Diagnosis not present

## 2019-08-27 DIAGNOSIS — I10 Essential (primary) hypertension: Secondary | ICD-10-CM | POA: Diagnosis not present

## 2019-09-01 DIAGNOSIS — J029 Acute pharyngitis, unspecified: Secondary | ICD-10-CM | POA: Diagnosis not present

## 2019-09-01 DIAGNOSIS — H6501 Acute serous otitis media, right ear: Secondary | ICD-10-CM | POA: Diagnosis not present

## 2019-09-08 DIAGNOSIS — H6501 Acute serous otitis media, right ear: Secondary | ICD-10-CM | POA: Diagnosis not present

## 2019-09-08 DIAGNOSIS — Z712 Person consulting for explanation of examination or test findings: Secondary | ICD-10-CM | POA: Diagnosis not present

## 2019-09-08 DIAGNOSIS — R770 Abnormality of albumin: Secondary | ICD-10-CM | POA: Diagnosis not present

## 2019-09-08 DIAGNOSIS — E876 Hypokalemia: Secondary | ICD-10-CM | POA: Diagnosis not present

## 2019-09-08 DIAGNOSIS — N3281 Overactive bladder: Secondary | ICD-10-CM | POA: Diagnosis not present

## 2019-09-08 DIAGNOSIS — J309 Allergic rhinitis, unspecified: Secondary | ICD-10-CM | POA: Diagnosis not present

## 2019-09-08 DIAGNOSIS — Z0001 Encounter for general adult medical examination with abnormal findings: Secondary | ICD-10-CM | POA: Diagnosis not present

## 2019-09-08 DIAGNOSIS — M9711XD 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, subsequent encounter: Secondary | ICD-10-CM | POA: Diagnosis not present

## 2019-09-16 DIAGNOSIS — G4733 Obstructive sleep apnea (adult) (pediatric): Secondary | ICD-10-CM | POA: Diagnosis not present

## 2019-09-16 DIAGNOSIS — M9701XA Periprosthetic fracture around internal prosthetic right hip joint, initial encounter: Secondary | ICD-10-CM | POA: Diagnosis not present

## 2019-09-16 DIAGNOSIS — M978XXA Periprosthetic fracture around other internal prosthetic joint, initial encounter: Secondary | ICD-10-CM | POA: Diagnosis not present

## 2019-09-16 DIAGNOSIS — Z96659 Presence of unspecified artificial knee joint: Secondary | ICD-10-CM | POA: Diagnosis not present

## 2019-09-16 DIAGNOSIS — M9701XD Periprosthetic fracture around internal prosthetic right hip joint, subsequent encounter: Secondary | ICD-10-CM | POA: Diagnosis not present

## 2019-09-16 DIAGNOSIS — I2510 Atherosclerotic heart disease of native coronary artery without angina pectoris: Secondary | ICD-10-CM | POA: Diagnosis not present

## 2019-09-16 DIAGNOSIS — Z23 Encounter for immunization: Secondary | ICD-10-CM | POA: Diagnosis not present

## 2019-09-16 DIAGNOSIS — Z96649 Presence of unspecified artificial hip joint: Secondary | ICD-10-CM | POA: Diagnosis not present

## 2019-09-16 DIAGNOSIS — Z8781 Personal history of (healed) traumatic fracture: Secondary | ICD-10-CM | POA: Diagnosis not present

## 2019-09-16 DIAGNOSIS — S72461K Displaced supracondylar fracture with intracondylar extension of lower end of right femur, subsequent encounter for closed fracture with nonunion: Secondary | ICD-10-CM | POA: Diagnosis not present

## 2019-09-16 DIAGNOSIS — E119 Type 2 diabetes mellitus without complications: Secondary | ICD-10-CM | POA: Diagnosis not present

## 2019-09-16 DIAGNOSIS — K219 Gastro-esophageal reflux disease without esophagitis: Secondary | ICD-10-CM | POA: Diagnosis not present

## 2019-09-16 DIAGNOSIS — Z96651 Presence of right artificial knee joint: Secondary | ICD-10-CM | POA: Diagnosis not present

## 2019-09-16 DIAGNOSIS — I10 Essential (primary) hypertension: Secondary | ICD-10-CM | POA: Diagnosis not present

## 2019-09-23 DIAGNOSIS — G8929 Other chronic pain: Secondary | ICD-10-CM | POA: Diagnosis not present

## 2019-09-23 DIAGNOSIS — M978XXA Periprosthetic fracture around other internal prosthetic joint, initial encounter: Secondary | ICD-10-CM | POA: Diagnosis not present

## 2019-09-23 DIAGNOSIS — Z8781 Personal history of (healed) traumatic fracture: Secondary | ICD-10-CM | POA: Diagnosis not present

## 2019-09-23 DIAGNOSIS — K219 Gastro-esophageal reflux disease without esophagitis: Secondary | ICD-10-CM | POA: Diagnosis not present

## 2019-09-23 DIAGNOSIS — Z96651 Presence of right artificial knee joint: Secondary | ICD-10-CM | POA: Diagnosis not present

## 2019-09-23 DIAGNOSIS — M9711XD 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, subsequent encounter: Secondary | ICD-10-CM | POA: Diagnosis not present

## 2019-09-23 DIAGNOSIS — Z471 Aftercare following joint replacement surgery: Secondary | ICD-10-CM | POA: Diagnosis not present

## 2019-09-23 DIAGNOSIS — R770 Abnormality of albumin: Secondary | ICD-10-CM | POA: Diagnosis not present

## 2019-09-23 DIAGNOSIS — J449 Chronic obstructive pulmonary disease, unspecified: Secondary | ICD-10-CM | POA: Diagnosis not present

## 2019-09-23 DIAGNOSIS — I959 Hypotension, unspecified: Secondary | ICD-10-CM | POA: Diagnosis not present

## 2019-09-23 DIAGNOSIS — R339 Retention of urine, unspecified: Secondary | ICD-10-CM | POA: Diagnosis not present

## 2019-09-23 DIAGNOSIS — I10 Essential (primary) hypertension: Secondary | ICD-10-CM | POA: Diagnosis not present

## 2019-09-23 DIAGNOSIS — G4733 Obstructive sleep apnea (adult) (pediatric): Secondary | ICD-10-CM | POA: Diagnosis not present

## 2019-09-23 DIAGNOSIS — E876 Hypokalemia: Secondary | ICD-10-CM | POA: Diagnosis not present

## 2019-09-23 DIAGNOSIS — Z96659 Presence of unspecified artificial knee joint: Secondary | ICD-10-CM | POA: Diagnosis not present

## 2019-09-23 DIAGNOSIS — E119 Type 2 diabetes mellitus without complications: Secondary | ICD-10-CM | POA: Diagnosis not present

## 2019-09-23 DIAGNOSIS — Z7982 Long term (current) use of aspirin: Secondary | ICD-10-CM | POA: Diagnosis not present

## 2019-09-25 DIAGNOSIS — Z96659 Presence of unspecified artificial knee joint: Secondary | ICD-10-CM | POA: Diagnosis not present

## 2019-09-25 DIAGNOSIS — I10 Essential (primary) hypertension: Secondary | ICD-10-CM | POA: Insufficient documentation

## 2019-09-25 DIAGNOSIS — M978XXA Periprosthetic fracture around other internal prosthetic joint, initial encounter: Secondary | ICD-10-CM | POA: Diagnosis not present

## 2019-09-26 DIAGNOSIS — M9711XD 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, subsequent encounter: Secondary | ICD-10-CM | POA: Diagnosis not present

## 2019-09-26 DIAGNOSIS — R770 Abnormality of albumin: Secondary | ICD-10-CM | POA: Diagnosis not present

## 2019-09-26 DIAGNOSIS — E876 Hypokalemia: Secondary | ICD-10-CM | POA: Diagnosis not present

## 2019-09-30 DIAGNOSIS — I10 Essential (primary) hypertension: Secondary | ICD-10-CM | POA: Diagnosis not present

## 2019-09-30 DIAGNOSIS — M1990 Unspecified osteoarthritis, unspecified site: Secondary | ICD-10-CM | POA: Diagnosis not present

## 2019-09-30 DIAGNOSIS — Z79891 Long term (current) use of opiate analgesic: Secondary | ICD-10-CM | POA: Diagnosis not present

## 2019-09-30 DIAGNOSIS — J449 Chronic obstructive pulmonary disease, unspecified: Secondary | ICD-10-CM | POA: Diagnosis not present

## 2019-09-30 DIAGNOSIS — Z9181 History of falling: Secondary | ICD-10-CM | POA: Diagnosis not present

## 2019-09-30 DIAGNOSIS — S72401K Unspecified fracture of lower end of right femur, subsequent encounter for closed fracture with nonunion: Secondary | ICD-10-CM | POA: Diagnosis not present

## 2019-09-30 DIAGNOSIS — Z7982 Long term (current) use of aspirin: Secondary | ICD-10-CM | POA: Diagnosis not present

## 2019-09-30 DIAGNOSIS — K219 Gastro-esophageal reflux disease without esophagitis: Secondary | ICD-10-CM | POA: Diagnosis not present

## 2019-09-30 DIAGNOSIS — E119 Type 2 diabetes mellitus without complications: Secondary | ICD-10-CM | POA: Diagnosis not present

## 2019-09-30 DIAGNOSIS — M9711XD 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, subsequent encounter: Secondary | ICD-10-CM | POA: Diagnosis not present

## 2019-10-03 DIAGNOSIS — M9711XD 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, subsequent encounter: Secondary | ICD-10-CM | POA: Diagnosis not present

## 2019-10-03 DIAGNOSIS — E119 Type 2 diabetes mellitus without complications: Secondary | ICD-10-CM | POA: Diagnosis not present

## 2019-10-03 DIAGNOSIS — Z9181 History of falling: Secondary | ICD-10-CM | POA: Diagnosis not present

## 2019-10-03 DIAGNOSIS — I10 Essential (primary) hypertension: Secondary | ICD-10-CM | POA: Diagnosis not present

## 2019-10-03 DIAGNOSIS — M1990 Unspecified osteoarthritis, unspecified site: Secondary | ICD-10-CM | POA: Diagnosis not present

## 2019-10-03 DIAGNOSIS — Z7982 Long term (current) use of aspirin: Secondary | ICD-10-CM | POA: Diagnosis not present

## 2019-10-03 DIAGNOSIS — K219 Gastro-esophageal reflux disease without esophagitis: Secondary | ICD-10-CM | POA: Diagnosis not present

## 2019-10-03 DIAGNOSIS — S72401K Unspecified fracture of lower end of right femur, subsequent encounter for closed fracture with nonunion: Secondary | ICD-10-CM | POA: Diagnosis not present

## 2019-10-03 DIAGNOSIS — J449 Chronic obstructive pulmonary disease, unspecified: Secondary | ICD-10-CM | POA: Diagnosis not present

## 2019-10-03 DIAGNOSIS — Z79891 Long term (current) use of opiate analgesic: Secondary | ICD-10-CM | POA: Diagnosis not present

## 2019-10-07 DIAGNOSIS — M25561 Pain in right knee: Secondary | ICD-10-CM | POA: Diagnosis not present

## 2019-10-07 DIAGNOSIS — R110 Nausea: Secondary | ICD-10-CM | POA: Diagnosis not present

## 2019-10-07 DIAGNOSIS — E876 Hypokalemia: Secondary | ICD-10-CM | POA: Diagnosis not present

## 2019-10-07 DIAGNOSIS — M9711XD 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, subsequent encounter: Secondary | ICD-10-CM | POA: Diagnosis not present

## 2019-10-08 DIAGNOSIS — M1990 Unspecified osteoarthritis, unspecified site: Secondary | ICD-10-CM | POA: Diagnosis not present

## 2019-10-08 DIAGNOSIS — E119 Type 2 diabetes mellitus without complications: Secondary | ICD-10-CM | POA: Diagnosis not present

## 2019-10-08 DIAGNOSIS — Z7982 Long term (current) use of aspirin: Secondary | ICD-10-CM | POA: Diagnosis not present

## 2019-10-08 DIAGNOSIS — J449 Chronic obstructive pulmonary disease, unspecified: Secondary | ICD-10-CM | POA: Diagnosis not present

## 2019-10-08 DIAGNOSIS — I10 Essential (primary) hypertension: Secondary | ICD-10-CM | POA: Diagnosis not present

## 2019-10-08 DIAGNOSIS — M9711XD 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, subsequent encounter: Secondary | ICD-10-CM | POA: Diagnosis not present

## 2019-10-08 DIAGNOSIS — Z79891 Long term (current) use of opiate analgesic: Secondary | ICD-10-CM | POA: Diagnosis not present

## 2019-10-08 DIAGNOSIS — S72401K Unspecified fracture of lower end of right femur, subsequent encounter for closed fracture with nonunion: Secondary | ICD-10-CM | POA: Diagnosis not present

## 2019-10-08 DIAGNOSIS — K219 Gastro-esophageal reflux disease without esophagitis: Secondary | ICD-10-CM | POA: Diagnosis not present

## 2019-10-08 DIAGNOSIS — Z9181 History of falling: Secondary | ICD-10-CM | POA: Diagnosis not present

## 2019-10-10 DIAGNOSIS — S72401K Unspecified fracture of lower end of right femur, subsequent encounter for closed fracture with nonunion: Secondary | ICD-10-CM | POA: Diagnosis not present

## 2019-10-10 DIAGNOSIS — J449 Chronic obstructive pulmonary disease, unspecified: Secondary | ICD-10-CM | POA: Diagnosis not present

## 2019-10-10 DIAGNOSIS — M1990 Unspecified osteoarthritis, unspecified site: Secondary | ICD-10-CM | POA: Diagnosis not present

## 2019-10-10 DIAGNOSIS — Z7982 Long term (current) use of aspirin: Secondary | ICD-10-CM | POA: Diagnosis not present

## 2019-10-10 DIAGNOSIS — Z79891 Long term (current) use of opiate analgesic: Secondary | ICD-10-CM | POA: Diagnosis not present

## 2019-10-10 DIAGNOSIS — M9711XD 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, subsequent encounter: Secondary | ICD-10-CM | POA: Diagnosis not present

## 2019-10-10 DIAGNOSIS — Z9181 History of falling: Secondary | ICD-10-CM | POA: Diagnosis not present

## 2019-10-10 DIAGNOSIS — I10 Essential (primary) hypertension: Secondary | ICD-10-CM | POA: Diagnosis not present

## 2019-10-10 DIAGNOSIS — K219 Gastro-esophageal reflux disease without esophagitis: Secondary | ICD-10-CM | POA: Diagnosis not present

## 2019-10-10 DIAGNOSIS — E119 Type 2 diabetes mellitus without complications: Secondary | ICD-10-CM | POA: Diagnosis not present

## 2019-10-14 DIAGNOSIS — S72454D Nondisplaced supracondylar fracture without intracondylar extension of lower end of right femur, subsequent encounter for closed fracture with routine healing: Secondary | ICD-10-CM | POA: Diagnosis not present

## 2019-10-14 DIAGNOSIS — M978XXA Periprosthetic fracture around other internal prosthetic joint, initial encounter: Secondary | ICD-10-CM | POA: Diagnosis not present

## 2019-10-14 DIAGNOSIS — Z96651 Presence of right artificial knee joint: Secondary | ICD-10-CM | POA: Diagnosis not present

## 2019-10-14 DIAGNOSIS — M978XXD Periprosthetic fracture around other internal prosthetic joint, subsequent encounter: Secondary | ICD-10-CM | POA: Diagnosis not present

## 2019-10-15 DIAGNOSIS — Z9181 History of falling: Secondary | ICD-10-CM | POA: Diagnosis not present

## 2019-10-15 DIAGNOSIS — Z7982 Long term (current) use of aspirin: Secondary | ICD-10-CM | POA: Diagnosis not present

## 2019-10-15 DIAGNOSIS — S72401K Unspecified fracture of lower end of right femur, subsequent encounter for closed fracture with nonunion: Secondary | ICD-10-CM | POA: Diagnosis not present

## 2019-10-15 DIAGNOSIS — M1990 Unspecified osteoarthritis, unspecified site: Secondary | ICD-10-CM | POA: Diagnosis not present

## 2019-10-15 DIAGNOSIS — E119 Type 2 diabetes mellitus without complications: Secondary | ICD-10-CM | POA: Diagnosis not present

## 2019-10-15 DIAGNOSIS — M9711XD 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, subsequent encounter: Secondary | ICD-10-CM | POA: Diagnosis not present

## 2019-10-15 DIAGNOSIS — K219 Gastro-esophageal reflux disease without esophagitis: Secondary | ICD-10-CM | POA: Diagnosis not present

## 2019-10-15 DIAGNOSIS — J449 Chronic obstructive pulmonary disease, unspecified: Secondary | ICD-10-CM | POA: Diagnosis not present

## 2019-10-15 DIAGNOSIS — I10 Essential (primary) hypertension: Secondary | ICD-10-CM | POA: Diagnosis not present

## 2019-10-15 DIAGNOSIS — Z79891 Long term (current) use of opiate analgesic: Secondary | ICD-10-CM | POA: Diagnosis not present

## 2019-10-16 DIAGNOSIS — S72401K Unspecified fracture of lower end of right femur, subsequent encounter for closed fracture with nonunion: Secondary | ICD-10-CM | POA: Diagnosis not present

## 2019-10-16 DIAGNOSIS — K219 Gastro-esophageal reflux disease without esophagitis: Secondary | ICD-10-CM | POA: Diagnosis not present

## 2019-10-16 DIAGNOSIS — E119 Type 2 diabetes mellitus without complications: Secondary | ICD-10-CM | POA: Diagnosis not present

## 2019-10-16 DIAGNOSIS — M1990 Unspecified osteoarthritis, unspecified site: Secondary | ICD-10-CM | POA: Diagnosis not present

## 2019-10-16 DIAGNOSIS — J449 Chronic obstructive pulmonary disease, unspecified: Secondary | ICD-10-CM | POA: Diagnosis not present

## 2019-10-16 DIAGNOSIS — Z7982 Long term (current) use of aspirin: Secondary | ICD-10-CM | POA: Diagnosis not present

## 2019-10-16 DIAGNOSIS — Z9181 History of falling: Secondary | ICD-10-CM | POA: Diagnosis not present

## 2019-10-16 DIAGNOSIS — Z79891 Long term (current) use of opiate analgesic: Secondary | ICD-10-CM | POA: Diagnosis not present

## 2019-10-16 DIAGNOSIS — I10 Essential (primary) hypertension: Secondary | ICD-10-CM | POA: Diagnosis not present

## 2019-10-16 DIAGNOSIS — M9711XD 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, subsequent encounter: Secondary | ICD-10-CM | POA: Diagnosis not present

## 2019-10-20 DIAGNOSIS — Z9181 History of falling: Secondary | ICD-10-CM | POA: Diagnosis not present

## 2019-10-20 DIAGNOSIS — M9711XD 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, subsequent encounter: Secondary | ICD-10-CM | POA: Diagnosis not present

## 2019-10-20 DIAGNOSIS — J449 Chronic obstructive pulmonary disease, unspecified: Secondary | ICD-10-CM | POA: Diagnosis not present

## 2019-10-20 DIAGNOSIS — I10 Essential (primary) hypertension: Secondary | ICD-10-CM | POA: Diagnosis not present

## 2019-10-20 DIAGNOSIS — K219 Gastro-esophageal reflux disease without esophagitis: Secondary | ICD-10-CM | POA: Diagnosis not present

## 2019-10-20 DIAGNOSIS — S72401K Unspecified fracture of lower end of right femur, subsequent encounter for closed fracture with nonunion: Secondary | ICD-10-CM | POA: Diagnosis not present

## 2019-10-20 DIAGNOSIS — E119 Type 2 diabetes mellitus without complications: Secondary | ICD-10-CM | POA: Diagnosis not present

## 2019-10-20 DIAGNOSIS — Z7982 Long term (current) use of aspirin: Secondary | ICD-10-CM | POA: Diagnosis not present

## 2019-10-20 DIAGNOSIS — M1990 Unspecified osteoarthritis, unspecified site: Secondary | ICD-10-CM | POA: Diagnosis not present

## 2019-10-20 DIAGNOSIS — Z79891 Long term (current) use of opiate analgesic: Secondary | ICD-10-CM | POA: Diagnosis not present

## 2019-10-22 DIAGNOSIS — Z9181 History of falling: Secondary | ICD-10-CM | POA: Diagnosis not present

## 2019-10-22 DIAGNOSIS — E119 Type 2 diabetes mellitus without complications: Secondary | ICD-10-CM | POA: Diagnosis not present

## 2019-10-22 DIAGNOSIS — K219 Gastro-esophageal reflux disease without esophagitis: Secondary | ICD-10-CM | POA: Diagnosis not present

## 2019-10-22 DIAGNOSIS — I10 Essential (primary) hypertension: Secondary | ICD-10-CM | POA: Diagnosis not present

## 2019-10-22 DIAGNOSIS — M9711XD 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, subsequent encounter: Secondary | ICD-10-CM | POA: Diagnosis not present

## 2019-10-22 DIAGNOSIS — Z7982 Long term (current) use of aspirin: Secondary | ICD-10-CM | POA: Diagnosis not present

## 2019-10-22 DIAGNOSIS — M1990 Unspecified osteoarthritis, unspecified site: Secondary | ICD-10-CM | POA: Diagnosis not present

## 2019-10-22 DIAGNOSIS — Z79891 Long term (current) use of opiate analgesic: Secondary | ICD-10-CM | POA: Diagnosis not present

## 2019-10-22 DIAGNOSIS — J449 Chronic obstructive pulmonary disease, unspecified: Secondary | ICD-10-CM | POA: Diagnosis not present

## 2019-10-22 DIAGNOSIS — S72401K Unspecified fracture of lower end of right femur, subsequent encounter for closed fracture with nonunion: Secondary | ICD-10-CM | POA: Diagnosis not present

## 2019-10-24 DIAGNOSIS — R350 Frequency of micturition: Secondary | ICD-10-CM | POA: Diagnosis not present

## 2019-10-24 DIAGNOSIS — E782 Mixed hyperlipidemia: Secondary | ICD-10-CM | POA: Diagnosis not present

## 2019-10-24 DIAGNOSIS — J449 Chronic obstructive pulmonary disease, unspecified: Secondary | ICD-10-CM | POA: Diagnosis not present

## 2019-10-24 DIAGNOSIS — E1169 Type 2 diabetes mellitus with other specified complication: Secondary | ICD-10-CM | POA: Diagnosis not present

## 2019-10-24 DIAGNOSIS — I10 Essential (primary) hypertension: Secondary | ICD-10-CM | POA: Diagnosis not present

## 2019-10-28 DIAGNOSIS — M9711XD 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, subsequent encounter: Secondary | ICD-10-CM | POA: Diagnosis not present

## 2019-10-28 DIAGNOSIS — Z9181 History of falling: Secondary | ICD-10-CM | POA: Diagnosis not present

## 2019-10-28 DIAGNOSIS — M1990 Unspecified osteoarthritis, unspecified site: Secondary | ICD-10-CM | POA: Diagnosis not present

## 2019-10-28 DIAGNOSIS — E119 Type 2 diabetes mellitus without complications: Secondary | ICD-10-CM | POA: Diagnosis not present

## 2019-10-28 DIAGNOSIS — I10 Essential (primary) hypertension: Secondary | ICD-10-CM | POA: Diagnosis not present

## 2019-10-28 DIAGNOSIS — Z7982 Long term (current) use of aspirin: Secondary | ICD-10-CM | POA: Diagnosis not present

## 2019-10-28 DIAGNOSIS — S72401K Unspecified fracture of lower end of right femur, subsequent encounter for closed fracture with nonunion: Secondary | ICD-10-CM | POA: Diagnosis not present

## 2019-10-28 DIAGNOSIS — J449 Chronic obstructive pulmonary disease, unspecified: Secondary | ICD-10-CM | POA: Diagnosis not present

## 2019-10-28 DIAGNOSIS — Z79891 Long term (current) use of opiate analgesic: Secondary | ICD-10-CM | POA: Diagnosis not present

## 2019-10-28 DIAGNOSIS — K219 Gastro-esophageal reflux disease without esophagitis: Secondary | ICD-10-CM | POA: Diagnosis not present

## 2019-10-31 DIAGNOSIS — Z79891 Long term (current) use of opiate analgesic: Secondary | ICD-10-CM | POA: Diagnosis not present

## 2019-10-31 DIAGNOSIS — Z7982 Long term (current) use of aspirin: Secondary | ICD-10-CM | POA: Diagnosis not present

## 2019-10-31 DIAGNOSIS — K219 Gastro-esophageal reflux disease without esophagitis: Secondary | ICD-10-CM | POA: Diagnosis not present

## 2019-10-31 DIAGNOSIS — M9711XD 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, subsequent encounter: Secondary | ICD-10-CM | POA: Diagnosis not present

## 2019-10-31 DIAGNOSIS — S72401K Unspecified fracture of lower end of right femur, subsequent encounter for closed fracture with nonunion: Secondary | ICD-10-CM | POA: Diagnosis not present

## 2019-10-31 DIAGNOSIS — I10 Essential (primary) hypertension: Secondary | ICD-10-CM | POA: Diagnosis not present

## 2019-10-31 DIAGNOSIS — Z9181 History of falling: Secondary | ICD-10-CM | POA: Diagnosis not present

## 2019-10-31 DIAGNOSIS — J449 Chronic obstructive pulmonary disease, unspecified: Secondary | ICD-10-CM | POA: Diagnosis not present

## 2019-10-31 DIAGNOSIS — M1990 Unspecified osteoarthritis, unspecified site: Secondary | ICD-10-CM | POA: Diagnosis not present

## 2019-10-31 DIAGNOSIS — E119 Type 2 diabetes mellitus without complications: Secondary | ICD-10-CM | POA: Diagnosis not present

## 2019-11-02 DIAGNOSIS — I10 Essential (primary) hypertension: Secondary | ICD-10-CM | POA: Diagnosis not present

## 2019-11-02 DIAGNOSIS — S72401K Unspecified fracture of lower end of right femur, subsequent encounter for closed fracture with nonunion: Secondary | ICD-10-CM | POA: Diagnosis not present

## 2019-11-02 DIAGNOSIS — M1990 Unspecified osteoarthritis, unspecified site: Secondary | ICD-10-CM | POA: Diagnosis not present

## 2019-11-02 DIAGNOSIS — K219 Gastro-esophageal reflux disease without esophagitis: Secondary | ICD-10-CM | POA: Diagnosis not present

## 2019-11-02 DIAGNOSIS — J449 Chronic obstructive pulmonary disease, unspecified: Secondary | ICD-10-CM | POA: Diagnosis not present

## 2019-11-02 DIAGNOSIS — Z9181 History of falling: Secondary | ICD-10-CM | POA: Diagnosis not present

## 2019-11-02 DIAGNOSIS — Z79891 Long term (current) use of opiate analgesic: Secondary | ICD-10-CM | POA: Diagnosis not present

## 2019-11-02 DIAGNOSIS — Z7982 Long term (current) use of aspirin: Secondary | ICD-10-CM | POA: Diagnosis not present

## 2019-11-02 DIAGNOSIS — E119 Type 2 diabetes mellitus without complications: Secondary | ICD-10-CM | POA: Diagnosis not present

## 2019-11-02 DIAGNOSIS — M9711XD 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, subsequent encounter: Secondary | ICD-10-CM | POA: Diagnosis not present

## 2019-11-07 DIAGNOSIS — I10 Essential (primary) hypertension: Secondary | ICD-10-CM | POA: Diagnosis not present

## 2019-11-07 DIAGNOSIS — S72401K Unspecified fracture of lower end of right femur, subsequent encounter for closed fracture with nonunion: Secondary | ICD-10-CM | POA: Diagnosis not present

## 2019-11-07 DIAGNOSIS — M1990 Unspecified osteoarthritis, unspecified site: Secondary | ICD-10-CM | POA: Diagnosis not present

## 2019-11-07 DIAGNOSIS — J449 Chronic obstructive pulmonary disease, unspecified: Secondary | ICD-10-CM | POA: Diagnosis not present

## 2019-11-07 DIAGNOSIS — Z7982 Long term (current) use of aspirin: Secondary | ICD-10-CM | POA: Diagnosis not present

## 2019-11-07 DIAGNOSIS — M9711XD 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, subsequent encounter: Secondary | ICD-10-CM | POA: Diagnosis not present

## 2019-11-07 DIAGNOSIS — E119 Type 2 diabetes mellitus without complications: Secondary | ICD-10-CM | POA: Diagnosis not present

## 2019-11-07 DIAGNOSIS — K219 Gastro-esophageal reflux disease without esophagitis: Secondary | ICD-10-CM | POA: Diagnosis not present

## 2019-11-07 DIAGNOSIS — Z79891 Long term (current) use of opiate analgesic: Secondary | ICD-10-CM | POA: Diagnosis not present

## 2019-11-07 DIAGNOSIS — Z9181 History of falling: Secondary | ICD-10-CM | POA: Diagnosis not present

## 2019-11-13 DIAGNOSIS — R350 Frequency of micturition: Secondary | ICD-10-CM | POA: Diagnosis not present

## 2019-11-13 DIAGNOSIS — J449 Chronic obstructive pulmonary disease, unspecified: Secondary | ICD-10-CM | POA: Diagnosis not present

## 2019-11-13 DIAGNOSIS — E1169 Type 2 diabetes mellitus with other specified complication: Secondary | ICD-10-CM | POA: Diagnosis not present

## 2019-11-13 DIAGNOSIS — E782 Mixed hyperlipidemia: Secondary | ICD-10-CM | POA: Diagnosis not present

## 2019-11-13 DIAGNOSIS — I10 Essential (primary) hypertension: Secondary | ICD-10-CM | POA: Diagnosis not present

## 2019-12-15 DIAGNOSIS — I10 Essential (primary) hypertension: Secondary | ICD-10-CM | POA: Diagnosis not present

## 2019-12-15 DIAGNOSIS — E1121 Type 2 diabetes mellitus with diabetic nephropathy: Secondary | ICD-10-CM | POA: Diagnosis not present

## 2019-12-15 DIAGNOSIS — K219 Gastro-esophageal reflux disease without esophagitis: Secondary | ICD-10-CM | POA: Diagnosis not present

## 2019-12-15 DIAGNOSIS — I2510 Atherosclerotic heart disease of native coronary artery without angina pectoris: Secondary | ICD-10-CM | POA: Diagnosis not present

## 2019-12-15 DIAGNOSIS — E782 Mixed hyperlipidemia: Secondary | ICD-10-CM | POA: Diagnosis not present

## 2019-12-16 DIAGNOSIS — S72454K Nondisplaced supracondylar fracture without intracondylar extension of lower end of right femur, subsequent encounter for closed fracture with nonunion: Secondary | ICD-10-CM | POA: Diagnosis not present

## 2019-12-16 DIAGNOSIS — Z96651 Presence of right artificial knee joint: Secondary | ICD-10-CM | POA: Diagnosis not present

## 2019-12-16 DIAGNOSIS — M9711XD 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, subsequent encounter: Secondary | ICD-10-CM | POA: Diagnosis not present

## 2019-12-16 DIAGNOSIS — M978XXD Periprosthetic fracture around other internal prosthetic joint, subsequent encounter: Secondary | ICD-10-CM | POA: Diagnosis not present

## 2019-12-16 DIAGNOSIS — M1611 Unilateral primary osteoarthritis, right hip: Secondary | ICD-10-CM | POA: Diagnosis not present

## 2020-01-09 IMAGING — CR DG KNEE COMPLETE 4+V*R*
4 series · 4 of 4 positions shown · non-contrast
Comparison: Prior radiograph from 10/10/2018.

CLINICAL DATA: Initial evaluation for acute pain, recent total knee
arthroplasty.

EXAM:
RIGHT KNEE - COMPLETE 4+ VIEW

[x knee ap right]
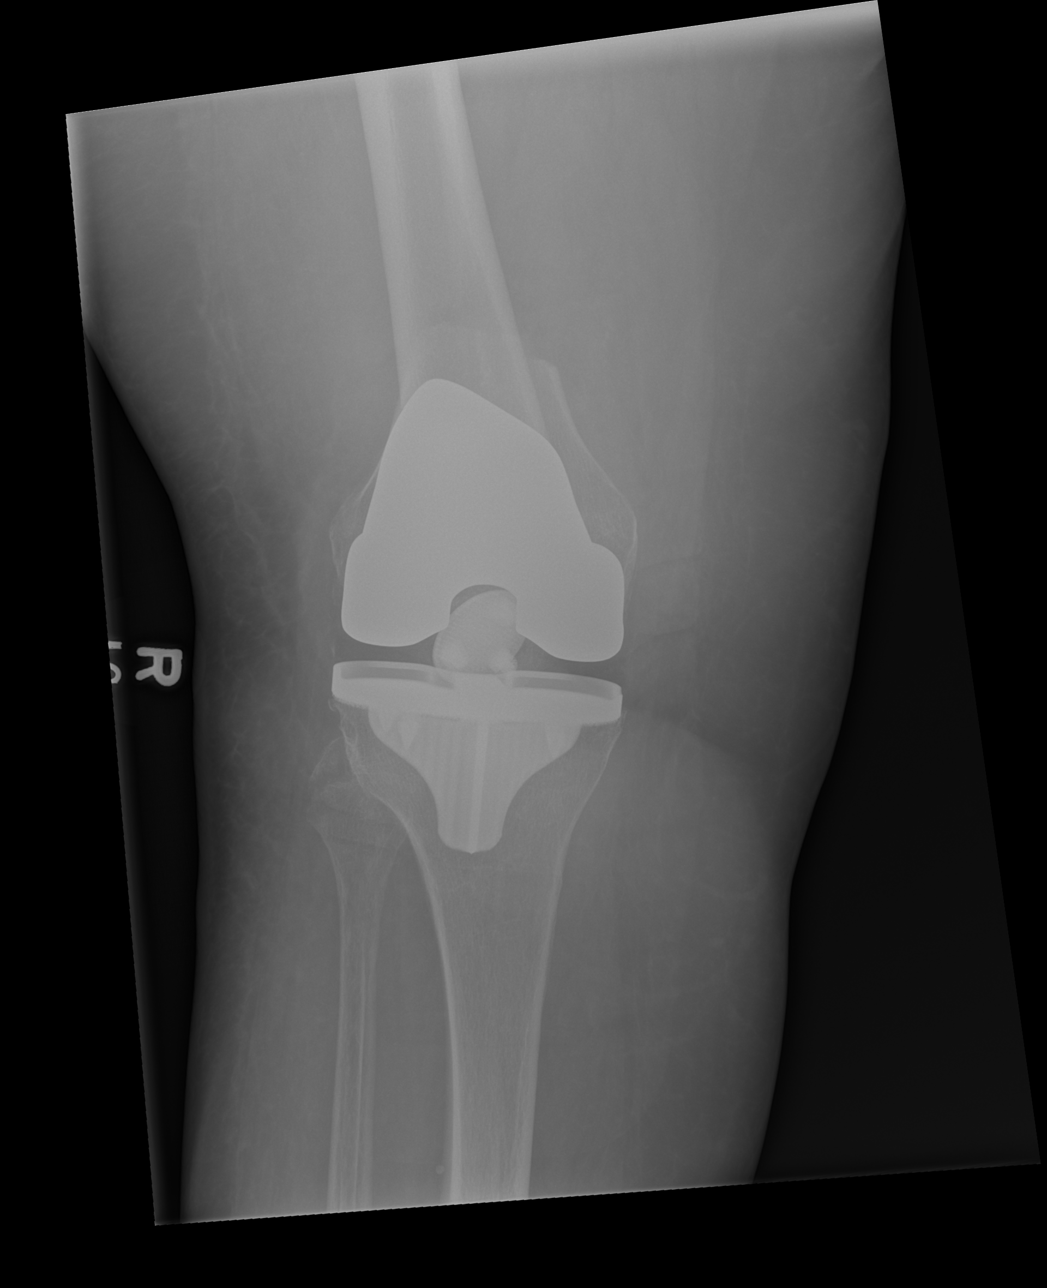

[x knee obl right (1 of 2)]
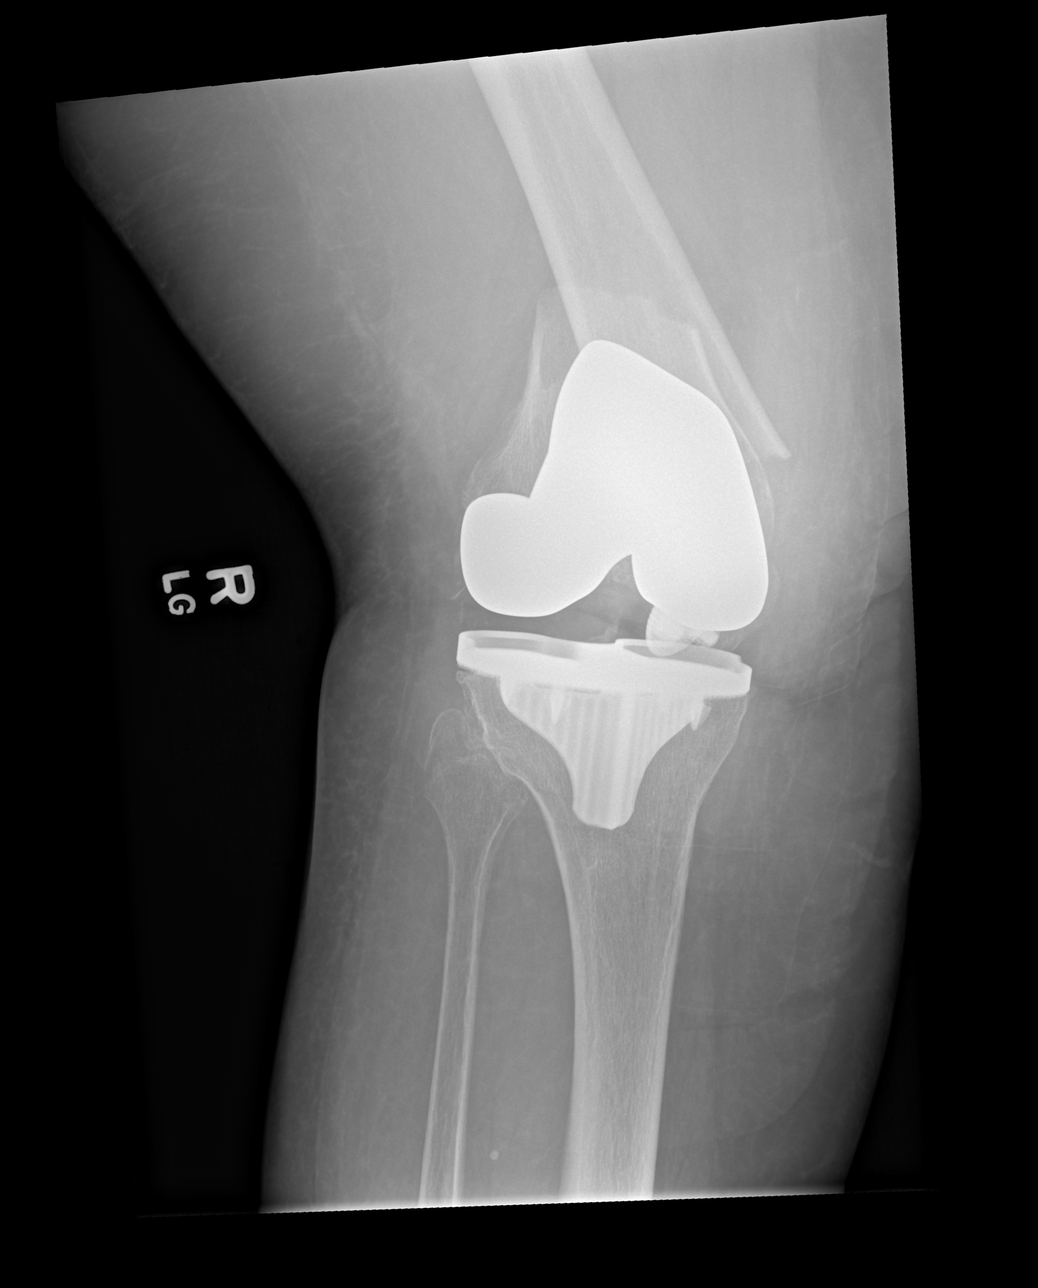

[x knee obl right (2 of 2)]
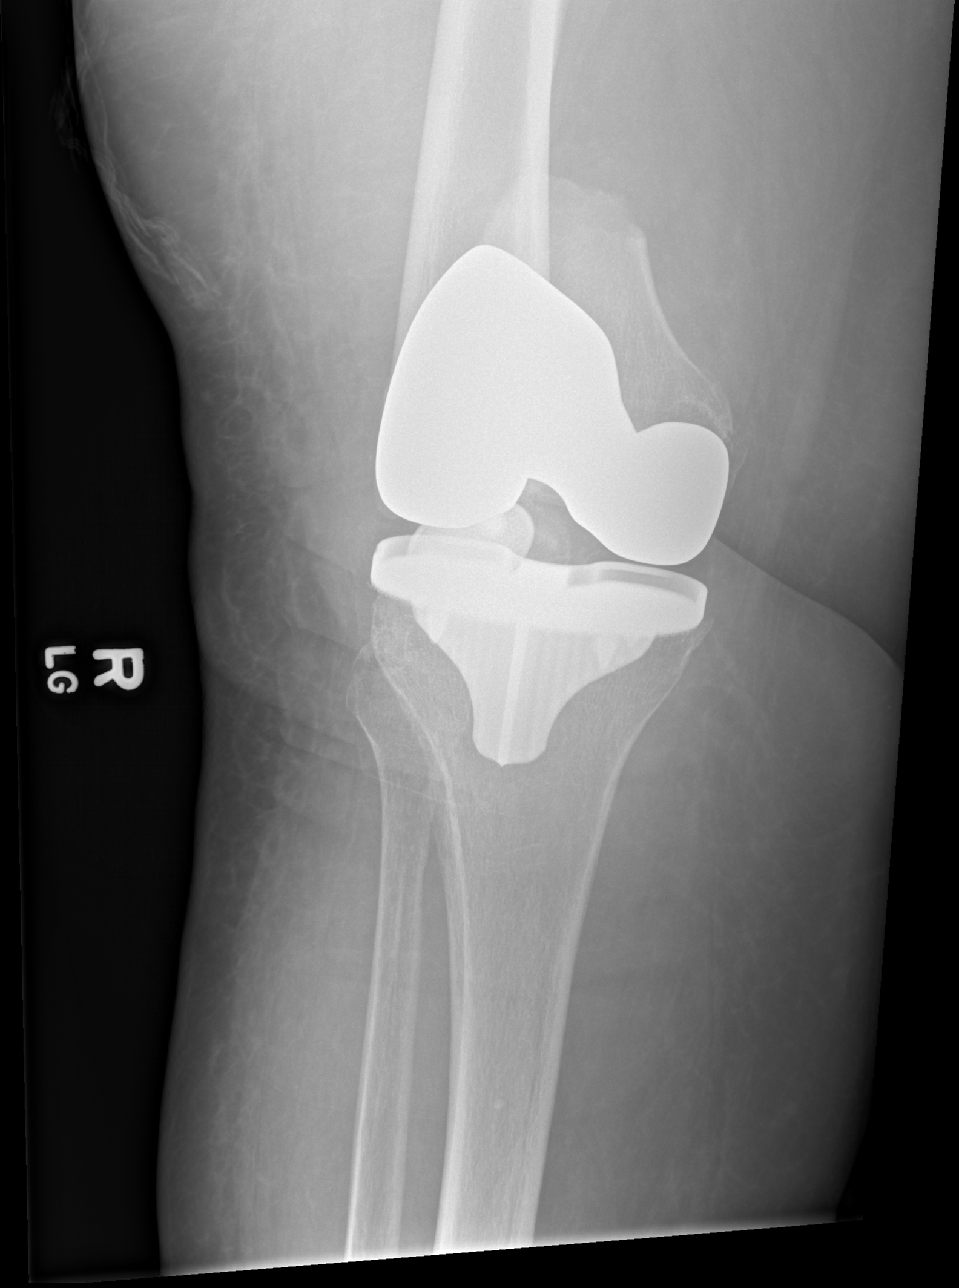

[x knee lat right]
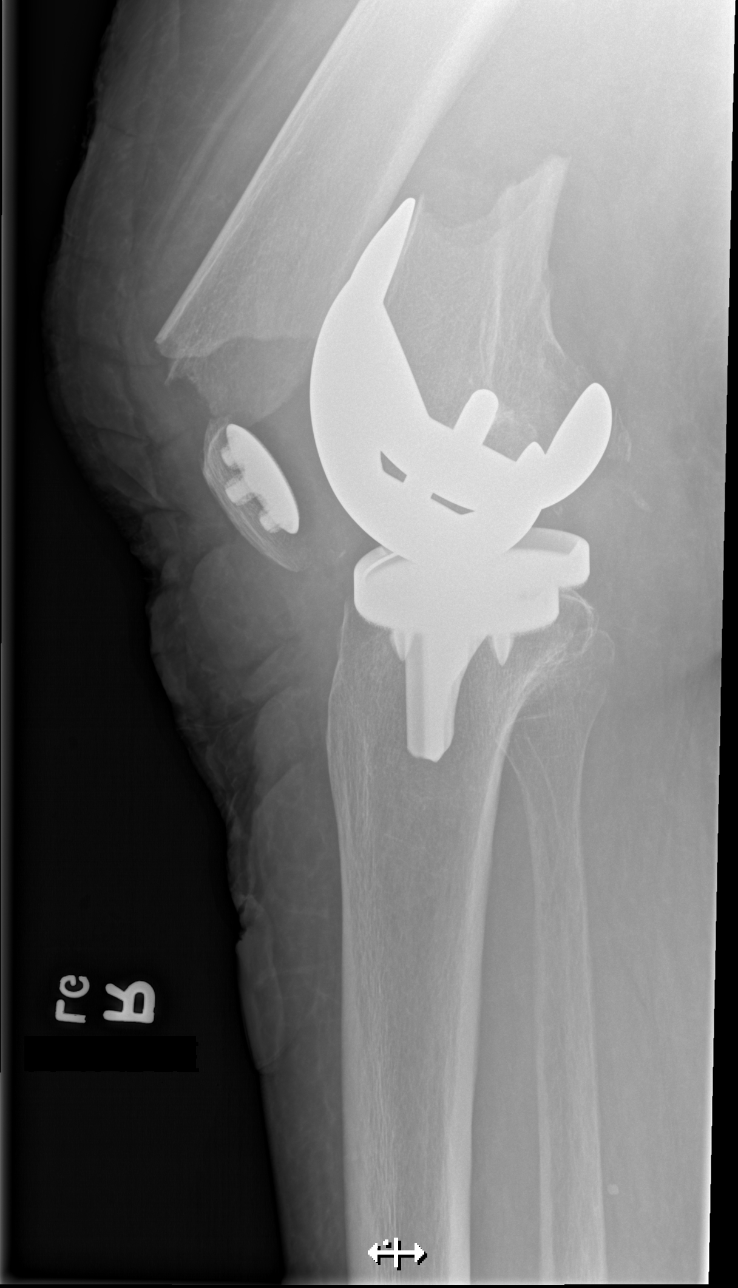

[4 of 4 positions shown; findings below may reference images not displayed]

FINDINGS: Right total knee arthroplasty in place. There is an acute oblique
periprosthetic fracture involving the distal right femoral shaft
just above the arthroplasty. Associated posterior displacement with
overlap. Distal femoral and proximal tibial components otherwise in
stable position. Patellar component angulated and slightly displaced
anteriorly by the fracture. Surrounding soft tissue swelling.
IMPRESSION: Acute oblique periprosthetic fracture of the distal femoral shaft
just above the right total knee arthroplasty.

## 2020-01-13 DIAGNOSIS — E1121 Type 2 diabetes mellitus with diabetic nephropathy: Secondary | ICD-10-CM | POA: Diagnosis not present

## 2020-01-13 DIAGNOSIS — E785 Hyperlipidemia, unspecified: Secondary | ICD-10-CM | POA: Diagnosis not present

## 2020-01-13 DIAGNOSIS — E782 Mixed hyperlipidemia: Secondary | ICD-10-CM | POA: Diagnosis not present

## 2020-01-13 DIAGNOSIS — J449 Chronic obstructive pulmonary disease, unspecified: Secondary | ICD-10-CM | POA: Diagnosis not present

## 2020-01-13 DIAGNOSIS — I10 Essential (primary) hypertension: Secondary | ICD-10-CM | POA: Diagnosis not present

## 2020-02-20 DIAGNOSIS — E782 Mixed hyperlipidemia: Secondary | ICD-10-CM | POA: Diagnosis not present

## 2020-02-20 DIAGNOSIS — E1121 Type 2 diabetes mellitus with diabetic nephropathy: Secondary | ICD-10-CM | POA: Diagnosis not present

## 2020-02-20 DIAGNOSIS — K219 Gastro-esophageal reflux disease without esophagitis: Secondary | ICD-10-CM | POA: Diagnosis not present

## 2020-02-20 DIAGNOSIS — D509 Iron deficiency anemia, unspecified: Secondary | ICD-10-CM | POA: Diagnosis not present

## 2020-02-20 DIAGNOSIS — I10 Essential (primary) hypertension: Secondary | ICD-10-CM | POA: Diagnosis not present

## 2020-03-17 DIAGNOSIS — X58XXXD Exposure to other specified factors, subsequent encounter: Secondary | ICD-10-CM | POA: Diagnosis not present

## 2020-03-17 DIAGNOSIS — S72461K Displaced supracondylar fracture with intracondylar extension of lower end of right femur, subsequent encounter for closed fracture with nonunion: Secondary | ICD-10-CM | POA: Diagnosis not present

## 2020-03-17 DIAGNOSIS — S72454K Nondisplaced supracondylar fracture without intracondylar extension of lower end of right femur, subsequent encounter for closed fracture with nonunion: Secondary | ICD-10-CM | POA: Diagnosis not present

## 2020-04-07 DIAGNOSIS — J302 Other seasonal allergic rhinitis: Secondary | ICD-10-CM | POA: Diagnosis not present

## 2020-04-07 DIAGNOSIS — R05 Cough: Secondary | ICD-10-CM | POA: Diagnosis not present

## 2020-04-12 DIAGNOSIS — E782 Mixed hyperlipidemia: Secondary | ICD-10-CM | POA: Diagnosis not present

## 2020-04-12 DIAGNOSIS — I10 Essential (primary) hypertension: Secondary | ICD-10-CM | POA: Diagnosis not present

## 2020-04-12 DIAGNOSIS — K219 Gastro-esophageal reflux disease without esophagitis: Secondary | ICD-10-CM | POA: Diagnosis not present

## 2020-04-12 DIAGNOSIS — E1121 Type 2 diabetes mellitus with diabetic nephropathy: Secondary | ICD-10-CM | POA: Diagnosis not present

## 2020-04-12 DIAGNOSIS — D509 Iron deficiency anemia, unspecified: Secondary | ICD-10-CM | POA: Diagnosis not present

## 2020-05-18 DIAGNOSIS — J069 Acute upper respiratory infection, unspecified: Secondary | ICD-10-CM | POA: Diagnosis not present

## 2020-06-09 DIAGNOSIS — K219 Gastro-esophageal reflux disease without esophagitis: Secondary | ICD-10-CM | POA: Diagnosis not present

## 2020-06-09 DIAGNOSIS — E785 Hyperlipidemia, unspecified: Secondary | ICD-10-CM | POA: Diagnosis not present

## 2020-06-09 DIAGNOSIS — E1121 Type 2 diabetes mellitus with diabetic nephropathy: Secondary | ICD-10-CM | POA: Diagnosis not present

## 2020-06-09 DIAGNOSIS — I10 Essential (primary) hypertension: Secondary | ICD-10-CM | POA: Diagnosis not present

## 2020-06-09 DIAGNOSIS — E782 Mixed hyperlipidemia: Secondary | ICD-10-CM | POA: Diagnosis not present

## 2020-06-10 ENCOUNTER — Other Ambulatory Visit (HOSPITAL_COMMUNITY): Payer: Self-pay | Admitting: Internal Medicine

## 2020-06-10 ENCOUNTER — Ambulatory Visit (HOSPITAL_COMMUNITY)
Admission: RE | Admit: 2020-06-10 | Discharge: 2020-06-10 | Disposition: A | Discharge status: Home / Self Care | Payer: Medicare Other | Source: Ambulatory Visit | Attending: Internal Medicine | Admitting: Internal Medicine

## 2020-06-10 ENCOUNTER — Other Ambulatory Visit: Payer: Self-pay

## 2020-06-10 DIAGNOSIS — J069 Acute upper respiratory infection, unspecified: Secondary | ICD-10-CM | POA: Diagnosis not present

## 2020-06-10 DIAGNOSIS — J9811 Atelectasis: Secondary | ICD-10-CM | POA: Diagnosis not present

## 2020-06-10 DIAGNOSIS — R059 Cough, unspecified: Secondary | ICD-10-CM

## 2020-06-10 DIAGNOSIS — R0602 Shortness of breath: Secondary | ICD-10-CM | POA: Diagnosis not present

## 2020-06-10 DIAGNOSIS — E1121 Type 2 diabetes mellitus with diabetic nephropathy: Secondary | ICD-10-CM | POA: Diagnosis not present

## 2020-06-10 DIAGNOSIS — R05 Cough: Secondary | ICD-10-CM | POA: Insufficient documentation

## 2020-06-10 DIAGNOSIS — I10 Essential (primary) hypertension: Secondary | ICD-10-CM | POA: Diagnosis not present

## 2020-06-10 DIAGNOSIS — E785 Hyperlipidemia, unspecified: Secondary | ICD-10-CM | POA: Diagnosis not present

## 2020-06-10 DIAGNOSIS — R21 Rash and other nonspecific skin eruption: Secondary | ICD-10-CM | POA: Diagnosis not present

## 2020-06-10 DIAGNOSIS — K219 Gastro-esophageal reflux disease without esophagitis: Secondary | ICD-10-CM | POA: Diagnosis not present

## 2020-06-10 DIAGNOSIS — E782 Mixed hyperlipidemia: Secondary | ICD-10-CM | POA: Diagnosis not present

## 2020-06-28 DIAGNOSIS — E559 Vitamin D deficiency, unspecified: Secondary | ICD-10-CM | POA: Diagnosis not present

## 2020-06-28 DIAGNOSIS — N3281 Overactive bladder: Secondary | ICD-10-CM | POA: Diagnosis not present

## 2020-06-28 DIAGNOSIS — Z01818 Encounter for other preprocedural examination: Secondary | ICD-10-CM | POA: Diagnosis not present

## 2020-06-28 DIAGNOSIS — R350 Frequency of micturition: Secondary | ICD-10-CM | POA: Diagnosis not present

## 2020-06-28 DIAGNOSIS — E1121 Type 2 diabetes mellitus with diabetic nephropathy: Secondary | ICD-10-CM | POA: Diagnosis not present

## 2020-06-28 DIAGNOSIS — G4733 Obstructive sleep apnea (adult) (pediatric): Secondary | ICD-10-CM | POA: Diagnosis not present

## 2020-06-30 DIAGNOSIS — E1169 Type 2 diabetes mellitus with other specified complication: Secondary | ICD-10-CM | POA: Diagnosis not present

## 2020-06-30 DIAGNOSIS — I10 Essential (primary) hypertension: Secondary | ICD-10-CM | POA: Diagnosis not present

## 2020-06-30 DIAGNOSIS — E782 Mixed hyperlipidemia: Secondary | ICD-10-CM | POA: Diagnosis not present

## 2020-06-30 DIAGNOSIS — J449 Chronic obstructive pulmonary disease, unspecified: Secondary | ICD-10-CM | POA: Diagnosis not present

## 2020-06-30 DIAGNOSIS — R350 Frequency of micturition: Secondary | ICD-10-CM | POA: Diagnosis not present

## 2020-07-05 DIAGNOSIS — M25531 Pain in right wrist: Secondary | ICD-10-CM | POA: Diagnosis not present

## 2020-08-02 DIAGNOSIS — I10 Essential (primary) hypertension: Secondary | ICD-10-CM | POA: Diagnosis not present

## 2020-08-02 DIAGNOSIS — D509 Iron deficiency anemia, unspecified: Secondary | ICD-10-CM | POA: Diagnosis not present

## 2020-08-02 DIAGNOSIS — E1121 Type 2 diabetes mellitus with diabetic nephropathy: Secondary | ICD-10-CM | POA: Diagnosis not present

## 2020-08-02 DIAGNOSIS — K219 Gastro-esophageal reflux disease without esophagitis: Secondary | ICD-10-CM | POA: Diagnosis not present

## 2020-08-02 DIAGNOSIS — E782 Mixed hyperlipidemia: Secondary | ICD-10-CM | POA: Diagnosis not present

## 2020-08-24 DIAGNOSIS — M2570 Osteophyte, unspecified joint: Secondary | ICD-10-CM | POA: Diagnosis not present

## 2020-08-24 DIAGNOSIS — M1712 Unilateral primary osteoarthritis, left knee: Secondary | ICD-10-CM | POA: Diagnosis not present

## 2020-08-24 DIAGNOSIS — M170 Bilateral primary osteoarthritis of knee: Secondary | ICD-10-CM | POA: Diagnosis not present

## 2020-08-24 DIAGNOSIS — Z96651 Presence of right artificial knee joint: Secondary | ICD-10-CM | POA: Diagnosis not present

## 2020-08-24 DIAGNOSIS — X58XXXD Exposure to other specified factors, subsequent encounter: Secondary | ICD-10-CM | POA: Diagnosis not present

## 2020-08-24 DIAGNOSIS — S72461K Displaced supracondylar fracture with intracondylar extension of lower end of right femur, subsequent encounter for closed fracture with nonunion: Secondary | ICD-10-CM | POA: Diagnosis not present

## 2020-08-24 DIAGNOSIS — S72461D Displaced supracondylar fracture with intracondylar extension of lower end of right femur, subsequent encounter for closed fracture with routine healing: Secondary | ICD-10-CM | POA: Diagnosis not present

## 2020-08-27 DIAGNOSIS — M1712 Unilateral primary osteoarthritis, left knee: Secondary | ICD-10-CM | POA: Diagnosis not present

## 2020-08-27 DIAGNOSIS — M25562 Pain in left knee: Secondary | ICD-10-CM | POA: Diagnosis not present

## 2020-09-09 DIAGNOSIS — K219 Gastro-esophageal reflux disease without esophagitis: Secondary | ICD-10-CM | POA: Diagnosis not present

## 2020-09-09 DIAGNOSIS — E1121 Type 2 diabetes mellitus with diabetic nephropathy: Secondary | ICD-10-CM | POA: Diagnosis not present

## 2020-09-09 DIAGNOSIS — E782 Mixed hyperlipidemia: Secondary | ICD-10-CM | POA: Diagnosis not present

## 2020-09-09 DIAGNOSIS — I10 Essential (primary) hypertension: Secondary | ICD-10-CM | POA: Diagnosis not present

## 2020-09-09 DIAGNOSIS — E785 Hyperlipidemia, unspecified: Secondary | ICD-10-CM | POA: Diagnosis not present

## 2020-09-22 DIAGNOSIS — S72454K Nondisplaced supracondylar fracture without intracondylar extension of lower end of right femur, subsequent encounter for closed fracture with nonunion: Secondary | ICD-10-CM | POA: Diagnosis not present

## 2020-09-22 DIAGNOSIS — G5793 Unspecified mononeuropathy of bilateral lower limbs: Secondary | ICD-10-CM | POA: Diagnosis not present

## 2020-09-22 DIAGNOSIS — Z87310 Personal history of (healed) osteoporosis fracture: Secondary | ICD-10-CM | POA: Insufficient documentation

## 2020-09-22 DIAGNOSIS — L97529 Non-pressure chronic ulcer of other part of left foot with unspecified severity: Secondary | ICD-10-CM | POA: Diagnosis not present

## 2020-09-22 DIAGNOSIS — M978XXA Periprosthetic fracture around other internal prosthetic joint, initial encounter: Secondary | ICD-10-CM | POA: Diagnosis not present

## 2020-09-23 DIAGNOSIS — K219 Gastro-esophageal reflux disease without esophagitis: Secondary | ICD-10-CM | POA: Diagnosis not present

## 2020-09-23 DIAGNOSIS — E785 Hyperlipidemia, unspecified: Secondary | ICD-10-CM | POA: Diagnosis not present

## 2020-09-23 DIAGNOSIS — Z23 Encounter for immunization: Secondary | ICD-10-CM | POA: Diagnosis not present

## 2020-09-23 DIAGNOSIS — E782 Mixed hyperlipidemia: Secondary | ICD-10-CM | POA: Diagnosis not present

## 2020-09-23 DIAGNOSIS — M810 Age-related osteoporosis without current pathological fracture: Secondary | ICD-10-CM | POA: Diagnosis not present

## 2020-09-23 DIAGNOSIS — E119 Type 2 diabetes mellitus without complications: Secondary | ICD-10-CM | POA: Diagnosis not present

## 2020-09-23 DIAGNOSIS — E1121 Type 2 diabetes mellitus with diabetic nephropathy: Secondary | ICD-10-CM | POA: Diagnosis not present

## 2020-09-23 DIAGNOSIS — I10 Essential (primary) hypertension: Secondary | ICD-10-CM | POA: Diagnosis not present

## 2020-09-23 DIAGNOSIS — J449 Chronic obstructive pulmonary disease, unspecified: Secondary | ICD-10-CM | POA: Diagnosis not present

## 2020-09-28 DIAGNOSIS — S72454K Nondisplaced supracondylar fracture without intracondylar extension of lower end of right femur, subsequent encounter for closed fracture with nonunion: Secondary | ICD-10-CM | POA: Diagnosis not present

## 2020-09-28 DIAGNOSIS — E1169 Type 2 diabetes mellitus with other specified complication: Secondary | ICD-10-CM | POA: Diagnosis not present

## 2020-09-28 DIAGNOSIS — Z96659 Presence of unspecified artificial knee joint: Secondary | ICD-10-CM | POA: Diagnosis not present

## 2020-09-28 DIAGNOSIS — M978XXA Periprosthetic fracture around other internal prosthetic joint, initial encounter: Secondary | ICD-10-CM | POA: Diagnosis not present

## 2020-09-28 DIAGNOSIS — E1142 Type 2 diabetes mellitus with diabetic polyneuropathy: Secondary | ICD-10-CM | POA: Diagnosis not present

## 2020-09-28 DIAGNOSIS — M978XXD Periprosthetic fracture around other internal prosthetic joint, subsequent encounter: Secondary | ICD-10-CM | POA: Diagnosis not present

## 2020-09-28 DIAGNOSIS — G5793 Unspecified mononeuropathy of bilateral lower limbs: Secondary | ICD-10-CM | POA: Diagnosis not present

## 2020-09-28 DIAGNOSIS — L97521 Non-pressure chronic ulcer of other part of left foot limited to breakdown of skin: Secondary | ICD-10-CM | POA: Diagnosis not present

## 2020-09-28 DIAGNOSIS — L97529 Non-pressure chronic ulcer of other part of left foot with unspecified severity: Secondary | ICD-10-CM | POA: Diagnosis not present

## 2020-09-28 DIAGNOSIS — Z87310 Personal history of (healed) osteoporosis fracture: Secondary | ICD-10-CM | POA: Diagnosis not present

## 2020-10-07 DIAGNOSIS — H6501 Acute serous otitis media, right ear: Secondary | ICD-10-CM | POA: Diagnosis not present

## 2020-10-07 DIAGNOSIS — E1121 Type 2 diabetes mellitus with diabetic nephropathy: Secondary | ICD-10-CM | POA: Diagnosis not present

## 2020-10-07 DIAGNOSIS — Z0001 Encounter for general adult medical examination with abnormal findings: Secondary | ICD-10-CM | POA: Diagnosis not present

## 2020-10-07 DIAGNOSIS — Z712 Person consulting for explanation of examination or test findings: Secondary | ICD-10-CM | POA: Diagnosis not present

## 2020-10-07 DIAGNOSIS — N3281 Overactive bladder: Secondary | ICD-10-CM | POA: Diagnosis not present

## 2020-10-07 DIAGNOSIS — E559 Vitamin D deficiency, unspecified: Secondary | ICD-10-CM | POA: Diagnosis not present

## 2020-10-07 DIAGNOSIS — M9711XD 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, subsequent encounter: Secondary | ICD-10-CM | POA: Diagnosis not present

## 2020-10-12 DIAGNOSIS — L97521 Non-pressure chronic ulcer of other part of left foot limited to breakdown of skin: Secondary | ICD-10-CM | POA: Diagnosis not present

## 2020-10-13 DIAGNOSIS — E1169 Type 2 diabetes mellitus with other specified complication: Secondary | ICD-10-CM | POA: Diagnosis not present

## 2020-10-13 DIAGNOSIS — E782 Mixed hyperlipidemia: Secondary | ICD-10-CM | POA: Diagnosis not present

## 2020-10-13 DIAGNOSIS — Z0001 Encounter for general adult medical examination with abnormal findings: Secondary | ICD-10-CM | POA: Diagnosis not present

## 2020-10-13 DIAGNOSIS — J449 Chronic obstructive pulmonary disease, unspecified: Secondary | ICD-10-CM | POA: Diagnosis not present

## 2020-10-13 DIAGNOSIS — I10 Essential (primary) hypertension: Secondary | ICD-10-CM | POA: Diagnosis not present

## 2020-10-13 DIAGNOSIS — R350 Frequency of micturition: Secondary | ICD-10-CM | POA: Diagnosis not present

## 2020-10-15 DIAGNOSIS — D519 Vitamin B12 deficiency anemia, unspecified: Secondary | ICD-10-CM | POA: Diagnosis not present

## 2020-10-25 DIAGNOSIS — E7849 Other hyperlipidemia: Secondary | ICD-10-CM | POA: Diagnosis not present

## 2020-10-25 DIAGNOSIS — E1140 Type 2 diabetes mellitus with diabetic neuropathy, unspecified: Secondary | ICD-10-CM | POA: Diagnosis not present

## 2020-10-25 DIAGNOSIS — D519 Vitamin B12 deficiency anemia, unspecified: Secondary | ICD-10-CM | POA: Diagnosis not present

## 2020-10-25 DIAGNOSIS — I10 Essential (primary) hypertension: Secondary | ICD-10-CM | POA: Diagnosis not present

## 2020-11-15 DIAGNOSIS — J069 Acute upper respiratory infection, unspecified: Secondary | ICD-10-CM | POA: Diagnosis not present

## 2020-11-15 DIAGNOSIS — R051 Acute cough: Secondary | ICD-10-CM | POA: Diagnosis not present

## 2020-12-07 DIAGNOSIS — L97521 Non-pressure chronic ulcer of other part of left foot limited to breakdown of skin: Secondary | ICD-10-CM | POA: Diagnosis not present

## 2020-12-10 DIAGNOSIS — E7849 Other hyperlipidemia: Secondary | ICD-10-CM | POA: Diagnosis not present

## 2020-12-10 DIAGNOSIS — E1121 Type 2 diabetes mellitus with diabetic nephropathy: Secondary | ICD-10-CM | POA: Diagnosis not present

## 2020-12-10 DIAGNOSIS — E876 Hypokalemia: Secondary | ICD-10-CM | POA: Diagnosis not present

## 2020-12-16 DIAGNOSIS — S72454K Nondisplaced supracondylar fracture without intracondylar extension of lower end of right femur, subsequent encounter for closed fracture with nonunion: Secondary | ICD-10-CM | POA: Diagnosis not present

## 2020-12-16 DIAGNOSIS — E1141 Type 2 diabetes mellitus with diabetic mononeuropathy: Secondary | ICD-10-CM | POA: Diagnosis not present

## 2020-12-16 DIAGNOSIS — M978XXA Periprosthetic fracture around other internal prosthetic joint, initial encounter: Secondary | ICD-10-CM | POA: Diagnosis not present

## 2020-12-16 DIAGNOSIS — E11621 Type 2 diabetes mellitus with foot ulcer: Secondary | ICD-10-CM | POA: Diagnosis not present

## 2020-12-16 DIAGNOSIS — L97529 Non-pressure chronic ulcer of other part of left foot with unspecified severity: Secondary | ICD-10-CM | POA: Diagnosis not present

## 2020-12-16 DIAGNOSIS — M810 Age-related osteoporosis without current pathological fracture: Secondary | ICD-10-CM | POA: Diagnosis not present

## 2020-12-16 DIAGNOSIS — Z87310 Personal history of (healed) osteoporosis fracture: Secondary | ICD-10-CM | POA: Diagnosis not present

## 2020-12-16 DIAGNOSIS — G5793 Unspecified mononeuropathy of bilateral lower limbs: Secondary | ICD-10-CM | POA: Diagnosis not present

## 2020-12-16 DIAGNOSIS — Z780 Asymptomatic menopausal state: Secondary | ICD-10-CM | POA: Diagnosis not present

## 2020-12-16 DIAGNOSIS — Z96659 Presence of unspecified artificial knee joint: Secondary | ICD-10-CM | POA: Diagnosis not present

## 2020-12-23 DIAGNOSIS — M1712 Unilateral primary osteoarthritis, left knee: Secondary | ICD-10-CM | POA: Diagnosis not present

## 2021-01-06 DIAGNOSIS — M810 Age-related osteoporosis without current pathological fracture: Secondary | ICD-10-CM | POA: Diagnosis not present

## 2021-01-08 DIAGNOSIS — E785 Hyperlipidemia, unspecified: Secondary | ICD-10-CM | POA: Diagnosis not present

## 2021-01-08 DIAGNOSIS — E1140 Type 2 diabetes mellitus with diabetic neuropathy, unspecified: Secondary | ICD-10-CM | POA: Diagnosis not present

## 2021-01-08 DIAGNOSIS — M791 Myalgia, unspecified site: Secondary | ICD-10-CM | POA: Diagnosis not present

## 2021-01-20 DIAGNOSIS — D519 Vitamin B12 deficiency anemia, unspecified: Secondary | ICD-10-CM | POA: Diagnosis not present

## 2021-01-20 DIAGNOSIS — M62830 Muscle spasm of back: Secondary | ICD-10-CM | POA: Diagnosis not present

## 2021-01-20 DIAGNOSIS — M545 Low back pain, unspecified: Secondary | ICD-10-CM | POA: Diagnosis not present

## 2021-02-22 DIAGNOSIS — E1142 Type 2 diabetes mellitus with diabetic polyneuropathy: Secondary | ICD-10-CM | POA: Diagnosis not present

## 2021-02-22 DIAGNOSIS — L84 Corns and callosities: Secondary | ICD-10-CM | POA: Diagnosis not present

## 2021-02-22 DIAGNOSIS — M79676 Pain in unspecified toe(s): Secondary | ICD-10-CM | POA: Diagnosis not present

## 2021-02-22 DIAGNOSIS — B351 Tinea unguium: Secondary | ICD-10-CM | POA: Diagnosis not present

## 2021-03-09 DIAGNOSIS — E782 Mixed hyperlipidemia: Secondary | ICD-10-CM | POA: Diagnosis not present

## 2021-03-09 DIAGNOSIS — D509 Iron deficiency anemia, unspecified: Secondary | ICD-10-CM | POA: Diagnosis not present

## 2021-03-09 DIAGNOSIS — E119 Type 2 diabetes mellitus without complications: Secondary | ICD-10-CM | POA: Diagnosis not present

## 2021-03-09 DIAGNOSIS — I2510 Atherosclerotic heart disease of native coronary artery without angina pectoris: Secondary | ICD-10-CM | POA: Diagnosis not present

## 2021-03-09 DIAGNOSIS — J449 Chronic obstructive pulmonary disease, unspecified: Secondary | ICD-10-CM | POA: Diagnosis not present

## 2021-03-09 DIAGNOSIS — I10 Essential (primary) hypertension: Secondary | ICD-10-CM | POA: Diagnosis not present

## 2021-03-09 DIAGNOSIS — K219 Gastro-esophageal reflux disease without esophagitis: Secondary | ICD-10-CM | POA: Diagnosis not present

## 2021-03-09 DIAGNOSIS — E1140 Type 2 diabetes mellitus with diabetic neuropathy, unspecified: Secondary | ICD-10-CM | POA: Diagnosis not present

## 2021-03-09 DIAGNOSIS — E1121 Type 2 diabetes mellitus with diabetic nephropathy: Secondary | ICD-10-CM | POA: Diagnosis not present

## 2021-04-10 DIAGNOSIS — I10 Essential (primary) hypertension: Secondary | ICD-10-CM | POA: Diagnosis not present

## 2021-04-10 DIAGNOSIS — R350 Frequency of micturition: Secondary | ICD-10-CM | POA: Diagnosis not present

## 2021-04-27 DIAGNOSIS — J069 Acute upper respiratory infection, unspecified: Secondary | ICD-10-CM | POA: Diagnosis not present

## 2021-04-27 DIAGNOSIS — R051 Acute cough: Secondary | ICD-10-CM | POA: Diagnosis not present

## 2021-04-27 DIAGNOSIS — K921 Melena: Secondary | ICD-10-CM | POA: Diagnosis not present

## 2021-05-03 DIAGNOSIS — E1142 Type 2 diabetes mellitus with diabetic polyneuropathy: Secondary | ICD-10-CM | POA: Diagnosis not present

## 2021-05-03 DIAGNOSIS — B351 Tinea unguium: Secondary | ICD-10-CM | POA: Diagnosis not present

## 2021-05-03 DIAGNOSIS — L84 Corns and callosities: Secondary | ICD-10-CM | POA: Diagnosis not present

## 2021-05-03 DIAGNOSIS — M79676 Pain in unspecified toe(s): Secondary | ICD-10-CM | POA: Diagnosis not present

## 2021-05-06 DIAGNOSIS — I10 Essential (primary) hypertension: Secondary | ICD-10-CM | POA: Diagnosis not present

## 2021-05-06 DIAGNOSIS — E538 Deficiency of other specified B group vitamins: Secondary | ICD-10-CM | POA: Diagnosis not present

## 2021-05-06 DIAGNOSIS — E1121 Type 2 diabetes mellitus with diabetic nephropathy: Secondary | ICD-10-CM | POA: Diagnosis not present

## 2021-05-06 DIAGNOSIS — E559 Vitamin D deficiency, unspecified: Secondary | ICD-10-CM | POA: Diagnosis not present

## 2021-05-13 DIAGNOSIS — R413 Other amnesia: Secondary | ICD-10-CM | POA: Diagnosis not present

## 2021-05-13 DIAGNOSIS — K219 Gastro-esophageal reflux disease without esophagitis: Secondary | ICD-10-CM | POA: Diagnosis not present

## 2021-05-13 DIAGNOSIS — E1121 Type 2 diabetes mellitus with diabetic nephropathy: Secondary | ICD-10-CM | POA: Diagnosis not present

## 2021-05-13 DIAGNOSIS — E785 Hyperlipidemia, unspecified: Secondary | ICD-10-CM | POA: Diagnosis not present

## 2021-05-13 DIAGNOSIS — K921 Melena: Secondary | ICD-10-CM | POA: Diagnosis not present

## 2021-05-13 DIAGNOSIS — E559 Vitamin D deficiency, unspecified: Secondary | ICD-10-CM | POA: Diagnosis not present

## 2021-05-13 DIAGNOSIS — M545 Low back pain, unspecified: Secondary | ICD-10-CM | POA: Diagnosis not present

## 2021-05-13 DIAGNOSIS — N189 Chronic kidney disease, unspecified: Secondary | ICD-10-CM | POA: Diagnosis not present

## 2021-05-13 DIAGNOSIS — R7989 Other specified abnormal findings of blood chemistry: Secondary | ICD-10-CM | POA: Diagnosis not present

## 2021-05-13 DIAGNOSIS — I10 Essential (primary) hypertension: Secondary | ICD-10-CM | POA: Diagnosis not present

## 2021-05-17 ENCOUNTER — Encounter: Payer: Self-pay | Admitting: Nurse Practitioner

## 2021-05-17 DIAGNOSIS — J019 Acute sinusitis, unspecified: Secondary | ICD-10-CM | POA: Diagnosis not present

## 2021-05-17 DIAGNOSIS — R051 Acute cough: Secondary | ICD-10-CM | POA: Diagnosis not present

## 2021-05-17 DIAGNOSIS — R053 Chronic cough: Secondary | ICD-10-CM | POA: Diagnosis not present

## 2021-05-19 ENCOUNTER — Other Ambulatory Visit: Payer: Self-pay

## 2021-05-19 DIAGNOSIS — M810 Age-related osteoporosis without current pathological fracture: Secondary | ICD-10-CM | POA: Insufficient documentation

## 2021-05-31 DIAGNOSIS — M5416 Radiculopathy, lumbar region: Secondary | ICD-10-CM | POA: Diagnosis not present

## 2021-06-06 DIAGNOSIS — R051 Acute cough: Secondary | ICD-10-CM | POA: Diagnosis not present

## 2021-06-21 ENCOUNTER — Encounter: Payer: Self-pay | Admitting: Nurse Practitioner

## 2021-06-21 ENCOUNTER — Other Ambulatory Visit (INDEPENDENT_AMBULATORY_CARE_PROVIDER_SITE_OTHER): Payer: Medicare Other

## 2021-06-21 ENCOUNTER — Ambulatory Visit (INDEPENDENT_AMBULATORY_CARE_PROVIDER_SITE_OTHER): Payer: Medicare Other | Admitting: Nurse Practitioner

## 2021-06-21 VITALS — BP 120/80 | HR 76 | Ht 59.5 in | Wt 213.4 lb

## 2021-06-21 DIAGNOSIS — K921 Melena: Secondary | ICD-10-CM | POA: Diagnosis not present

## 2021-06-21 DIAGNOSIS — K219 Gastro-esophageal reflux disease without esophagitis: Secondary | ICD-10-CM

## 2021-06-21 LAB — CBC
HCT: 37.5 % (ref 36.0–46.0)
Hemoglobin: 12.3 g/dL (ref 12.0–15.0)
MCHC: 32.8 g/dL (ref 30.0–36.0)
MCV: 89.6 fl (ref 78.0–100.0)
Platelets: 208 10*3/uL (ref 150.0–400.0)
RBC: 4.18 Mil/uL (ref 3.87–5.11)
RDW: 15.5 % (ref 11.5–15.5)
WBC: 7.1 10*3/uL (ref 4.0–10.5)

## 2021-06-21 LAB — COMPREHENSIVE METABOLIC PANEL
ALT: 12 U/L (ref 0–35)
AST: 18 U/L (ref 0–37)
Albumin: 3.8 g/dL (ref 3.5–5.2)
Alkaline Phosphatase: 63 U/L (ref 39–117)
BUN: 11 mg/dL (ref 6–23)
CO2: 28 mEq/L (ref 19–32)
Calcium: 8.7 mg/dL (ref 8.4–10.5)
Chloride: 100 mEq/L (ref 96–112)
Creatinine, Ser: 1.05 mg/dL (ref 0.40–1.20)
GFR: 51.59 mL/min — ABNORMAL LOW (ref >60.00)
Glucose, Bld: 87 mg/dL (ref 70–99)
Potassium: 3.8 mEq/L (ref 3.5–5.1)
Sodium: 135 mEq/L (ref 135–145)
Total Bilirubin: 0.5 mg/dL (ref 0.2–1.2)
Total Protein: 6.8 g/dL (ref 6.0–8.3)

## 2021-06-21 MED ORDER — PANTOPRAZOLE SODIUM 40 MG PO TBEC: 40.0000 mg | DELAYED_RELEASE_TABLET | Freq: Two times a day (BID) | ORAL | 2 refills | Status: AC

## 2021-06-21 NOTE — Progress Notes (Signed)
06/22/2021 Haniyyah Sakuma Ladouceur [id number] 07-07-1946   CHIEF COMPLAINT: Rectal bleeding   HISTORY OF PRESENT ILLNESS:  Navneet Schmuck. Patty 75 year old female with past medical history of anxiety, depression, osteoarthritis, hypertension, hypercholesterolemia, nonobstructive coronary artery disease, diabetes mellitus type 2, asthma (she denies having COPD), sleep apnea does not use CPAP, anemia, IgA deficiency, GERD and colon polyps. She underwent a right knee arthroplasty 10/10/2018 and post operatively she fell and broke her right femur bone. She subsequently underwent ORIF for a right periprosthetic right distal fracture 10/23/2018 followed by [facility] orthopedist. She required further surgery to her right knee s/p right femur repair with an autogenous bone graft with an antibiotic implant device 09/25/2019. She has severe left knee arthritis with plans for eventual left knee total replacement surgery. She is permitted to bear weight briefly otherwise uses a wheelchair to avoid potential falls.   She presents to our office today as referred by Dr. Allyn Kenner and Eloy End FNP for further evaluation regarding rectal bleeding. She presents in a motorized wheelchair. She is accompanied by her husband who is utilizing portable oxygen. She reports seeing a small to moderate amount of bright red blood on the toilet tissue and on the stool x 3 on the same day one month ago without recurrence. No associated anorectal pain. She is passing a normal brown stool most days, sometimes strains to pass a BM. No melena. She complains of having daily significant heartburn for "many years".  No dysphagia. No upper or lower abdominal pain. She is taking Pantoprazole 40mg  QD. She took [employer] Q 4 hours for several months following her initial right knee replacement surgery then switched to [facility] powder Q 6 hours after she developed itchiness while taking [employer]. Over the past month or so, she has reduced BC powder use to  once or twice weekly. She is no longer taking Oxycodone. She is passing a normal brown formed stool most days. She underwent an EGD by Dr. Hilarie Fredrickson on 09/12/2012 which showed a small hiatal hernia and gastropathy. A colonoscopy was done 11/01/2012 which showed a 39mm tubular adenomatous polyp removed from the ascending colon. A repeat colonoscopy in 5 years was recommended but was not done. She was recently treated with a Zpak and Prednisone for an upper respiratory tract infection which resolved.   She has a history of coronary artery disease. She denies having any chest pain or palpitations. She underwent a pre-op cardiac clearance 09/12/2018 by Dr. Marlou Porch and Lyda Jester PA-C 09/12/2018, no further cardiac evaluation was required. She was advised to follow up in office with Dr. Marlou Porch in 1 year which was not done.   LHC 03/2008: Mid AV groove circumflex 30%, mid RCA 20%, EF 60%.  Stress test 09/2017: Gated study reveals Normal LV motion and thickening of all segments. Calculated LVEF = 59%.  CT images reviewed and demonstrate mild scattered coronary artery atherosclerotic calcifications. There is subsegmental atelectasis in the visualized lungs. Please note CT images were used for attenuation correction and are not a diagnostic study.  IMPRESSION:  1. No reversible myocardial perfusion defects identified.   2. Apparent fixed inferior segment defects resolve with attenuation correction, most compatible with diaphragm attenuation artifact. 3. Normal calculated left ventricle ejection fraction. 4. Mild scattered coronary artery atherosclerotic calcifications noted on the CT portion of the exam.    EGD 09/12/2012: 1. The mucosa of the esophagus appeared normal. 2. Small hiatus hernia was found. 3. Gastropathy was found in the gastric antrum.  4. The duodenal mucosa showed no abnormality in the bulb and second portion of the duodenum.   Colonoscopy 11/01/2012: 1. Normal mucosa in the  terminal ileum 2. Sessile polyp measuring 17mm found in the ascending colon 3. The colonic mucosa appeared normal throughout the entire examined colon. 1. Surgical [P], ascending polyp - TUBULAR ADENOMA (ONE FRAGMENT). NO HIGH GRADE DYSPLASIA OR MALIGNANCY IDENTIFIED. 2. Surgical [P], random colon biopsy - BENIGN COLONIC MUCOSA. NO SIGNIFICANT INFLAMMATION OR OTHER ABNORMALITIES IDENTIFIED.  Past Medical History:  Diagnosis Date   Anemia    Anxiety    Arthritis    Asthma    CAD (coronary artery disease)    Cath 2009, AV groove CX 30%, RCA 20 %   Candidiasis of skin and nail    Carpal tunnel syndrome    Chronic back pain    Chronic headaches    migraines   Complication of anesthesia    if no breathing tx before anesthesia wakes with asthma   COPD (chronic obstructive pulmonary disease) ([place])    Depression    Diabetes mellitus    DIET CONTROLLED NOW   Diverticulosis    Gallstones    Gastroparesis    ?   GERD (gastroesophageal reflux disease)    H/O echocardiogram    Echo 09/2011: Mild LVH, EF 03-50%, grade 1 diastolic dysfunction, trivial AI, mild MR, mild LAE.   H/O exercise stress test    Dobutamine Myoview 09/2011: No scar or ischemia, no EKG changes, study not gated.   HH (hiatus hernia)    History of gallstones    History of pneumonia    Hypercholesteremia    Hypertension    IBS (irritable bowel syndrome)    Osteoporosis    Peripheral neuropathy    LEGS AND FEET   Pneumonia LAST TIME 8 YRS AGO   S/P cardiac catheterization    LHC 03/2008: Mid AV groove circumflex 30%, mid RCA 20%, EF 60%   Seasonal allergies    Skin cancer    scc/bcc   Skin yeast infection 07/05/2015   Sleep apnea    cpap    Stones in the urinary tract    Subclinical hypothyroidism    UTI (urinary tract infection)    Past Surgical History:  Procedure Laterality Date   ABDOMINAL HYSTERECTOMY     partial   APPENDECTOMY     ARTHOSCOPIC ROTAOR CUFF REPAIR Right    X 2   BACK SURGERY      X 4   BACK SURGERY     BREAST SURGERY     lump x 2 R   CHOLECYSTECTOMY     EYE SURGERY  2018   IOC FOR CATARACTS BOTH EYES,    EYE SURGERY  2019   YAG LASER FOR CATARACT FILM   FOOT SURGERY     R   KNEE ARTHROPLASTY Right 10/10/2018   Procedure: RIGHT TOTAL KNEE ARTHROPLASTY WITH COMPUTER NAVIGATION;  Surgeon: Rod Can, MD;  Location: [facility];  Service: Orthopedics;  Laterality: Right;  Needs RNFA   KNEE SURGERY     R   NOSE SURGERY     skin cancer excision   ORIF PERIPROSTHETIC FRACTURE Right 10/23/2018   Procedure: OPEN REDUCTION INTERNAL FIXATION (ORIF) RIGHT  PERIPROSTHETIC FRACTURE;  Surgeon: Rod Can, MD;  Location: [facility];  Service: Orthopedics;  Laterality: Right;   RIGHT WRIST SURGERY WITH PLATE  09/38/1829   shoulder surg     x2 R   TOE AMPUTATION  rightX 1 in past LEFT X 1 done sept 2019 at [facility]   [facility]     L   Social History: She is married. She has one son and one daughter. Nonsmoker. No alcohol use. No drug use.   Family History: Mother with heart disease. Father with heart disease and stroke. Brother with history of liver cancer. Brother with leukemia. Sister with hypertension and diabetes. No family history of esophageal, gastric or colon cancer.   Allergies  Allergen Reactions   Sulfa Antibiotics Swelling    Mouth and throat    Aspirin-Acetaminophen-Caffeine Hives    Patient indicates that both brands of Good[employer] powder makes her "break out in hives"   Oxycodone-Acetaminophen Itching   Codeine Itching    TAKES OK WITH BENADRYL   Cymbalta [Duloxetine Hcl] Other (See Comments)    Cannot urinate   Ibuprofen Rash   Keflex [Cephalexin]    Macrodantin [Nitrofurantoin Macrocrystal] Nausea And Vomiting   Mucinex [Guaifenesin Er] Other (See Comments)    Urinary retention   Tape Rash     Outpatient Encounter Medications as of 06/21/2021  Medication Sig   albuterol (PROVENTIL) (2.5  MG/3ML) 0.083% nebulizer solution Take 2.5 mg by nebulization every 6 (six) hours as needed for shortness of breath.    albuterol (VENTOLIN HFA) 108 (90 Base) MCG/ACT inhaler Inhale 2 puffs into the lungs every 4 (four) hours as needed for wheezing or shortness of breath. (Patient taking differently: Inhale 2 puffs into the lungs every 4 (four) hours as needed for wheezing or shortness of breath. PROAIR)   ALLERGY RELIEF 10 MG tablet Take 10 mg by mouth daily.   ALPRAZolam (XANAX) 0.25 MG tablet Take 0.25 mg by mouth 2 (two) times daily.   budesonide-formoterol (SYMBICORT) 80-4.5 MCG/ACT inhaler Inhale 2 puffs into the lungs at bedtime.    calcium carbonate (OS-CAL - DOSED IN MG OF ELEMENTAL CALCIUM) 1250 (500 Ca) MG tablet calcium carbonate 500 mg calcium (1,250 mg) tablet  TAKE ONE TABLET BY MOUTH ONCE DAILY FOR hypocalcemia   citalopram (CELEXA) 40 MG tablet Take 40 mg by mouth at bedtime.    diclofenac Sodium (VOLTAREN) 1 % GEL Apply 2-4 g topically as needed.   diphenhydrAMINE (BENADRYL) 25 mg capsule Take 1 capsule (25 mg total) by mouth every 6 (six) hours as needed for itching.   gabapentin (NEURONTIN) 400 MG capsule Take 400 mg by mouth at bedtime.   HYDROcodone-acetaminophen (NORCO/VICODIN) 5-325 MG tablet Take 1 tablet by mouth 3 (three) times daily.   lisinopril (ZESTRIL) 5 MG tablet Take 5 mg by mouth daily.   ondansetron (ZOFRAN) 4 MG tablet Take 1 tablet (4 mg total) by mouth every 6 (six) hours as needed for nausea.   pantoprazole (PROTONIX) 40 MG tablet Take 1 tablet (40 mg total) by mouth 2 (two) times daily. Take 30 minutes before breakfast.   risedronate (ACTONEL) 150 MG tablet Take 150 mg by mouth every 30 (thirty) days. with water on empty stomach, nothing by mouth or lie down for next 30 minutes.   rosuvastatin (CRESTOR) 5 MG tablet Take 5 mg by mouth every Sunday.    topiramate (TOPAMAX) 100 MG tablet Take 150 mg by mouth at bedtime.   triamcinolone cream (KENALOG) 0.1 %  Apply 1 application topically 2 (two) times daily as needed.   Vitamin D, Ergocalciferol, (DRISDOL) 50000 units CAPS capsule Take 50,000 Units by mouth every Saturday.    [  DISCONTINUED] pantoprazole (PROTONIX) 40 MG tablet pantoprazole 40 mg tablet,delayed release  TAKE ONE TABLET BY MOUTH ONCE DAILY FOR stomach acid   [DISCONTINUED] buPROPion (WELLBUTRIN XL) 150 MG 24 hr tablet Take by mouth.   [DISCONTINUED] cyanocobalamin (,VITAMIN B-12,) 1000 MCG/ML injection Inject 1,000 mcg into the muscle every 30 (thirty) days.    [DISCONTINUED] docusate sodium (COLACE) 100 MG capsule Take 1 capsule (100 mg total) by mouth 2 (two) times daily.   [DISCONTINUED] enoxaparin (LOVENOX) 40 MG/0.4ML injection Inject 0.4 mLs (40 mg total) into the skin daily.   [DISCONTINUED] fluticasone (FLONASE) 50 MCG/ACT nasal spray fluticasone propionate 50 mcg/actuation nasal spray,suspension  USE 2 SPRAY(S) IN EACH NOSTRIL ONCE DAILY   [DISCONTINUED] mirabegron ER (MYRBETRIQ) 25 MG TB24 tablet Take 25 mg by mouth at bedtime.   [DISCONTINUED] omeprazole (PRILOSEC) 20 MG capsule omeprazole 20 mg capsule,delayed release   [DISCONTINUED] senna (SENOKOT) 8.6 MG TABS tablet Take 1 tablet (8.6 mg total) by mouth 2 (two) times daily.   No facility-administered encounter medications on file as of 06/21/2021.    REVIEW OF SYSTEMS:  Gen: Denies fever, sweats or chills. No weight loss.  CV: + Leg swelling, left > right. No chest pain or palpitations.  Resp: Denies cough, shortness of breath of hemoptysis.  GI: Denies heartburn, dysphagia, stomach or lower abdominal pain. No diarrhea or constipation.  GU : + Excessive urination.  MS: + Right and left knee pain.  Derm: Denies rash, itchiness, skin lesions or unhealing ulcers. Psych: + Anxiety and depression.  Heme: Denies bruising, bleeding. Neuro:  + Headaches.  Endo:  Denies any problems with DM, thyroid or adrenal function.   PHYSICAL EXAM: BP 120/80 (BP Location: Left  Arm, Patient Position: Sitting, Cuff Size: Normal)   Pulse 76   Ht 4' 11.5" (1.511 m) Comment: height measured without shoes  Wt 213 lb 6 oz (96.8 kg)   BMI 42.38 kg/m   Wt Readings from Last 3 Encounters:  06/21/21 213 lb 6 oz (96.8 kg)  10/23/18 198 lb (89.8 kg)  10/10/18 198 lb (89.8 kg)    General: 75 year old female presents in a motorized wheelchair in NAD. Head: Normocephalic and atraumatic. Eyes:  Sclerae non-icteric, conjunctive pink. Ears: Normal auditory acuity. Mouth: Poor dentition. No dentures. No ulcers or lesions.  Neck: Supple, no lymphadenopathy or thyromegaly.  Lungs: Clear bilaterally to auscultation without wheezes, crackles or rhonchi. Heart: Regular rate and rhythm. No murmur, rub or gallop appreciated.  Abdomen: Soft, nontender, non distended. No masses. No hepatosplenomegaly. Normoactive bowel sounds x 4 quadrants. Large RUQ scar intact.  Rectal: Deferred.  Musculoskeletal: Symmetrical with no gross deformities. Skin: Warm and dry. No rash or lesions on visible extremities. Extremities: bilateral LE edema L > R (patient reports left leg has been more swollen than the right leg for years). Negative Homan's sign, no erythema or increased warmth to touch.  Neurological: Alert oriented x 4, no focal deficits.  Psychological:  Alert and cooperative. Normal mood and affect.  ASSESSMENT AND PLAN: 45. 75 year old female with a history of a tubular adenomatous polyp per colonoscopy in 2013 presents with rectal bleeding one month ago without recurrence.  -CBC, CMP -Miralax as needed to avoid staining -Patient to call office if rectal bleeding recurs  -Colonoscopy at [facility] benefits and risks discussed including risk with sedation, risk of bleeding, perforation and infection   2. GERD. Patient reports having significant heartburn daily for several years. + Goody Powder -> BC powder use  since her right knee surgery, recently reduced BC powder use.  -EGD at [facility] at time of  colonoscopy benefits and risks discussed including risk with sedation, risk of bleeding, perforation and infection  -Protonix 40mg  po bid -GERD diet discussed  -Avoid NSAIDS/BC powder/[employer] Powder  3. CAD, no chest pain. Last saw cardiology in 2019 for pre-op clearance. -Dr. Hilarie Fredrickson to verify if patient should see her cardiologist prior to proceeding with an EGD and colonoscopy   4. Asthma (? COPD), recent URI  resolved after treated with Zpak and Prednisone  5. Sleep Apnea does not use cpap   EGD and colonoscopy to be scheduled at [facility] as the patient has limited ambulation privileges per her [place] orthopedist, she can briefly stand to bear weight, she can take a few steps but is required to use a wheelchair for transport and to avoid any potential falls/injuries as her right femur has not yet completely healed per the patient's report. Per protocol, any patient who is wheelchair bound is required to have procedures done at [facility].           CC:  Celene Squibb, MD

## 2021-06-21 NOTE — Patient Instructions (Addendum)
LABS:  Lab work has been ordered for you today. Our lab is located in the basement. Press "B" on the elevator. The lab is located at the first door on the left as you exit the elevator.  HEALTHCARE LAWS AND MY CHART RESULTS: Due to recent changes in healthcare laws, you may see the results of your imaging and laboratory studies on MyChart before your provider has had a chance to review them.   We understand that in some cases there may be results that are confusing or concerning to you. Not all laboratory results come back in the same time frame and the provider may be waiting for multiple results in order to interpret others.  Please give [place] 48 hours in order for your provider to thoroughly review all the results before contacting the office for clarification of your results.   MEDICATION: We have sent the following medication to your pharmacy for you to pick up at your convenience: Protonix 40 mg, take 1 twice a day.  We will contact you to schedule the hospital procedures once we have Dr. Vena Rua schedule available. It was great seeing you today! Thank you for entrusting me with your care and choosing [facility].  Noralyn Pick, CRNP  Gastroesophageal Reflux Disease, Adult  Gastroesophageal reflux (GER) happens when acid from the stomach flows up into the tube that connects the mouth and the stomach (esophagus). Normally, food travels down the esophagus and stays in the stomach to be digested. With GER, food and stomach acid sometimes move back up into theesophagus. You may have a disease called gastroesophageal reflux disease (GERD) if the reflux: Happens often. Causes frequent or very bad symptoms. Causes problems such as damage to the esophagus. When this happens, the esophagus becomes sore and swollen. Over time, GERD can make small holes (ulcers) in the lining of the esophagus. What are the causes? This condition is caused by a problem with the muscle between  the esophagus and the stomach. When this muscle is weak or not normal, it does not close properlyto keep food and acid from coming back up from the stomach. The muscle can be weak because of: Tobacco use. Pregnancy. Having a certain type of hernia (hiatal hernia). Alcohol use. Certain foods and drinks, such as coffee, chocolate, onions, and peppermint. What increases the risk? Being overweight. Having a disease that affects your connective tissue. Taking NSAIDs, such a ibuprofen. What are the signs or symptoms? Heartburn. Difficult or painful swallowing. The feeling of having a lump in the throat. A bitter taste in the mouth. Bad breath. Having a lot of saliva. Having an upset or bloated stomach. Burping. Chest pain. Different conditions can cause chest pain. Make sure you see your doctor if you have chest pain. Shortness of breath or wheezing. A long-term cough or a cough at night. Wearing away of the surface of teeth (tooth enamel). Weight loss. How is this treated? Making changes to your diet. Taking medicine. Having surgery. Treatment will depend on how bad your symptoms are. Follow these instructions at home: Eating and drinking  Follow a diet as told by your doctor. You may need to avoid foods and drinks such as: Coffee and tea, with or without caffeine. Drinks that contain alcohol. Energy drinks and sports drinks. Bubbly (carbonated) drinks or sodas. Chocolate and cocoa. Peppermint and mint flavorings. Garlic and onions. Horseradish. Spicy and acidic foods. These include peppers, chili powder, curry powder, vinegar, hot sauces, and BBQ sauce. Citrus fruit juices and  citrus fruits, such as oranges, lemons, and limes. Tomato-based foods. These include red sauce, chili, salsa, and pizza with red sauce. Fried and fatty foods. These include donuts, french fries, potato chips, and high-fat dressings. High-fat meats. These include hot dogs, rib eye steak, sausage, ham,  and bacon. High-fat dairy items, such as whole milk, butter, and cream cheese. Eat small meals often. Avoid eating large meals. Avoid drinking large amounts of liquid with your meals. Avoid eating meals during the 2-3 hours before bedtime. Avoid lying down right after you eat. Do not exercise right after you eat.  Lifestyle  Do not smoke or use any products that contain nicotine or tobacco. If you need help quitting, ask your doctor. Try to lower your stress. If you need help doing this, ask your doctor. If you are overweight, lose an amount of weight that is healthy for you. Ask your doctor about a safe weight loss goal.  General instructions Pay attention to any changes in your symptoms. Take over-the-counter and prescription medicines only as told by your doctor. Do not take aspirin, ibuprofen, or other NSAIDs unless your doctor says it is okay. Wear loose clothes. Do not wear anything tight around your waist. Raise (elevate) the head of your bed about 6 inches (15 cm). You may need to use a wedge to do this. Avoid bending over if this makes your symptoms worse. Keep all follow-up visits. Contact a doctor if: You have new symptoms. You lose weight and you do not know why. You have trouble swallowing or it hurts to swallow. You have wheezing or a cough that keeps happening. You have a hoarse voice. Your symptoms do not get better with treatment. Get help right away if: You have sudden pain in your arms, neck, jaw, teeth, or back. You suddenly feel sweaty, dizzy, or light-headed. You have chest pain or shortness of breath. You vomit and the vomit is green, yellow, or black, or it looks like blood or coffee grounds. You faint. Your poop (stool) is red, bloody, or black. You cannot swallow, drink, or eat. These symptoms may represent a serious problem that is an emergency. Do not wait to see if the symptoms will go away. Get medical help right away. Call your local emergency  services (911 in the [place] Do not drive yourself to the hospital. Summary If a person has gastroesophageal reflux disease (GERD), food and stomach acid move back up into the esophagus and cause symptoms or problems such as damage to the esophagus. Treatment will depend on how bad your symptoms are. Follow a diet as told by your doctor. Take all medicines only as told by your doctor. This information is not intended to replace advice given to you by your health care provider. Make sure you discuss any questions you have with your healthcare provider. Document Revised: 06/07/2020 Document Reviewed: 06/07/2020 Elsevier Patient Education  [place].  If you are age 20 or older, your body mass index should be between 23-30. Your Body mass index is 42.38 kg/m. If this is out of the aforementioned range listed, please consider follow up with your Primary Care Provider.  If you are age 24 or younger, your body mass index should be between 19-25. Your Body mass index is 42.38 kg/m. If this is out of the aformentioned range listed, please consider follow up with your Primary Care Provider.   The [facility] providers would like to encourage you to use [facility] to communicate with providers for non-urgent  requests or questions.  Due to long hold times on the telephone, sending your provider a message by [facility] may be faster and more efficient way to get a response. Please allow 48 business hours for a response.  Please remember that this is for non-urgent requests/questions.

## 2021-06-23 DIAGNOSIS — R350 Frequency of micturition: Secondary | ICD-10-CM | POA: Diagnosis not present

## 2021-06-23 DIAGNOSIS — I10 Essential (primary) hypertension: Secondary | ICD-10-CM | POA: Diagnosis not present

## 2021-07-07 DIAGNOSIS — M810 Age-related osteoporosis without current pathological fracture: Secondary | ICD-10-CM | POA: Diagnosis not present

## 2021-07-07 DIAGNOSIS — Z7983 Long term (current) use of bisphosphonates: Secondary | ICD-10-CM | POA: Diagnosis not present

## 2021-07-07 DIAGNOSIS — Z5181 Encounter for therapeutic drug level monitoring: Secondary | ICD-10-CM | POA: Diagnosis not present

## 2021-07-08 NOTE — Progress Notes (Signed)
Addendum: Reviewed and agree with assessment and management plan. If no current cardiac symptoms including chest pain, dyspnea, orthopnea, edema then okay to proceed with monitored anesthesia care in the outpatient hospital setting without cardiology evaluation.  However should any of the symptoms exist for her recently then cardiology evaluation would be appropriate. Vernal Rutan, Lajuan Lines, MD

## 2021-07-11 DIAGNOSIS — M79671 Pain in right foot: Secondary | ICD-10-CM | POA: Diagnosis not present

## 2021-07-11 DIAGNOSIS — M25572 Pain in left ankle and joints of left foot: Secondary | ICD-10-CM | POA: Diagnosis not present

## 2021-07-15 DIAGNOSIS — M5416 Radiculopathy, lumbar region: Secondary | ICD-10-CM | POA: Diagnosis not present

## 2021-08-05 DIAGNOSIS — M5416 Radiculopathy, lumbar region: Secondary | ICD-10-CM | POA: Diagnosis not present

## 2021-08-05 DIAGNOSIS — M961 Postlaminectomy syndrome, not elsewhere classified: Secondary | ICD-10-CM | POA: Diagnosis not present

## 2021-08-10 DIAGNOSIS — R350 Frequency of micturition: Secondary | ICD-10-CM | POA: Diagnosis not present

## 2021-08-10 DIAGNOSIS — E119 Type 2 diabetes mellitus without complications: Secondary | ICD-10-CM | POA: Diagnosis not present

## 2021-08-10 DIAGNOSIS — I10 Essential (primary) hypertension: Secondary | ICD-10-CM | POA: Diagnosis not present

## 2021-08-16 DIAGNOSIS — E559 Vitamin D deficiency, unspecified: Secondary | ICD-10-CM | POA: Diagnosis not present

## 2021-08-16 DIAGNOSIS — I10 Essential (primary) hypertension: Secondary | ICD-10-CM | POA: Diagnosis not present

## 2021-08-16 DIAGNOSIS — E538 Deficiency of other specified B group vitamins: Secondary | ICD-10-CM | POA: Diagnosis not present

## 2021-08-16 DIAGNOSIS — E119 Type 2 diabetes mellitus without complications: Secondary | ICD-10-CM | POA: Diagnosis not present

## 2021-08-22 ENCOUNTER — Other Ambulatory Visit: Payer: Self-pay

## 2021-08-22 ENCOUNTER — Ambulatory Visit (HOSPITAL_COMMUNITY)
Admission: RE | Admit: 2021-08-22 | Discharge: 2021-08-22 | Disposition: A | Discharge status: Home / Self Care | Payer: Medicare Other | Source: Ambulatory Visit | Attending: Family Medicine | Admitting: Family Medicine

## 2021-08-22 ENCOUNTER — Other Ambulatory Visit (HOSPITAL_COMMUNITY): Payer: Self-pay | Admitting: Family Medicine

## 2021-08-22 DIAGNOSIS — R42 Dizziness and giddiness: Secondary | ICD-10-CM | POA: Diagnosis not present

## 2021-08-22 DIAGNOSIS — R059 Cough, unspecified: Secondary | ICD-10-CM | POA: Diagnosis not present

## 2021-08-22 DIAGNOSIS — E1121 Type 2 diabetes mellitus with diabetic nephropathy: Secondary | ICD-10-CM | POA: Diagnosis not present

## 2021-08-22 DIAGNOSIS — R051 Acute cough: Secondary | ICD-10-CM

## 2021-08-22 DIAGNOSIS — E559 Vitamin D deficiency, unspecified: Secondary | ICD-10-CM | POA: Diagnosis not present

## 2021-08-22 DIAGNOSIS — E538 Deficiency of other specified B group vitamins: Secondary | ICD-10-CM | POA: Diagnosis not present

## 2021-08-22 DIAGNOSIS — E785 Hyperlipidemia, unspecified: Secondary | ICD-10-CM | POA: Diagnosis not present

## 2021-08-22 DIAGNOSIS — N189 Chronic kidney disease, unspecified: Secondary | ICD-10-CM | POA: Diagnosis not present

## 2021-08-22 DIAGNOSIS — R918 Other nonspecific abnormal finding of lung field: Secondary | ICD-10-CM | POA: Diagnosis not present

## 2021-09-09 DIAGNOSIS — E785 Hyperlipidemia, unspecified: Secondary | ICD-10-CM | POA: Diagnosis not present

## 2021-09-09 DIAGNOSIS — R350 Frequency of micturition: Secondary | ICD-10-CM | POA: Diagnosis not present

## 2021-09-09 DIAGNOSIS — I10 Essential (primary) hypertension: Secondary | ICD-10-CM | POA: Diagnosis not present

## 2021-09-12 ENCOUNTER — Other Ambulatory Visit (HOSPITAL_COMMUNITY): Payer: Self-pay | Admitting: Radiology

## 2021-09-12 DIAGNOSIS — R9389 Abnormal findings on diagnostic imaging of other specified body structures: Secondary | ICD-10-CM

## 2021-09-20 DIAGNOSIS — M5416 Radiculopathy, lumbar region: Secondary | ICD-10-CM | POA: Diagnosis not present

## 2021-09-29 DIAGNOSIS — Z0001 Encounter for general adult medical examination with abnormal findings: Secondary | ICD-10-CM | POA: Diagnosis not present

## 2021-09-29 DIAGNOSIS — E538 Deficiency of other specified B group vitamins: Secondary | ICD-10-CM | POA: Diagnosis not present

## 2021-10-10 DIAGNOSIS — E782 Mixed hyperlipidemia: Secondary | ICD-10-CM | POA: Diagnosis not present

## 2021-10-10 DIAGNOSIS — I10 Essential (primary) hypertension: Secondary | ICD-10-CM | POA: Diagnosis not present

## 2021-10-10 DIAGNOSIS — R350 Frequency of micturition: Secondary | ICD-10-CM | POA: Diagnosis not present

## 2021-10-24 DIAGNOSIS — Z23 Encounter for immunization: Secondary | ICD-10-CM | POA: Diagnosis not present

## 2021-10-24 DIAGNOSIS — K047 Periapical abscess without sinus: Secondary | ICD-10-CM | POA: Diagnosis not present

## 2021-11-09 DIAGNOSIS — R350 Frequency of micturition: Secondary | ICD-10-CM | POA: Diagnosis not present

## 2021-11-09 DIAGNOSIS — I10 Essential (primary) hypertension: Secondary | ICD-10-CM | POA: Diagnosis not present

## 2021-11-09 DIAGNOSIS — E782 Mixed hyperlipidemia: Secondary | ICD-10-CM | POA: Diagnosis not present

## 2021-11-14 DIAGNOSIS — E538 Deficiency of other specified B group vitamins: Secondary | ICD-10-CM | POA: Diagnosis not present

## 2021-12-09 DIAGNOSIS — E782 Mixed hyperlipidemia: Secondary | ICD-10-CM | POA: Diagnosis not present

## 2021-12-09 DIAGNOSIS — R350 Frequency of micturition: Secondary | ICD-10-CM | POA: Diagnosis not present

## 2021-12-09 DIAGNOSIS — I10 Essential (primary) hypertension: Secondary | ICD-10-CM | POA: Diagnosis not present

## 2021-12-28 DIAGNOSIS — E538 Deficiency of other specified B group vitamins: Secondary | ICD-10-CM | POA: Diagnosis not present

## 2022-01-08 DIAGNOSIS — E785 Hyperlipidemia, unspecified: Secondary | ICD-10-CM | POA: Diagnosis not present

## 2022-01-08 DIAGNOSIS — I10 Essential (primary) hypertension: Secondary | ICD-10-CM | POA: Diagnosis not present

## 2022-01-10 DIAGNOSIS — Z5181 Encounter for therapeutic drug level monitoring: Secondary | ICD-10-CM | POA: Diagnosis not present

## 2022-01-10 DIAGNOSIS — E785 Hyperlipidemia, unspecified: Secondary | ICD-10-CM | POA: Diagnosis not present

## 2022-01-10 DIAGNOSIS — I10 Essential (primary) hypertension: Secondary | ICD-10-CM | POA: Diagnosis not present

## 2022-01-10 DIAGNOSIS — R350 Frequency of micturition: Secondary | ICD-10-CM | POA: Diagnosis not present

## 2022-01-10 DIAGNOSIS — Z7983 Long term (current) use of bisphosphonates: Secondary | ICD-10-CM | POA: Diagnosis not present

## 2022-01-10 DIAGNOSIS — M810 Age-related osteoporosis without current pathological fracture: Secondary | ICD-10-CM | POA: Diagnosis not present

## 2022-01-19 DIAGNOSIS — M810 Age-related osteoporosis without current pathological fracture: Secondary | ICD-10-CM | POA: Diagnosis not present

## 2022-01-19 DIAGNOSIS — M978XXA Periprosthetic fracture around other internal prosthetic joint, initial encounter: Secondary | ICD-10-CM | POA: Diagnosis not present

## 2022-01-19 DIAGNOSIS — Z96659 Presence of unspecified artificial knee joint: Secondary | ICD-10-CM | POA: Diagnosis not present

## 2022-01-19 DIAGNOSIS — M79651 Pain in right thigh: Secondary | ICD-10-CM | POA: Diagnosis not present

## 2022-02-07 DIAGNOSIS — I129 Hypertensive chronic kidney disease with stage 1 through stage 4 chronic kidney disease, or unspecified chronic kidney disease: Secondary | ICD-10-CM | POA: Diagnosis not present

## 2022-02-07 DIAGNOSIS — J449 Chronic obstructive pulmonary disease, unspecified: Secondary | ICD-10-CM | POA: Diagnosis not present

## 2022-02-07 DIAGNOSIS — E1121 Type 2 diabetes mellitus with diabetic nephropathy: Secondary | ICD-10-CM | POA: Diagnosis not present

## 2022-02-07 DIAGNOSIS — N189 Chronic kidney disease, unspecified: Secondary | ICD-10-CM | POA: Diagnosis not present

## 2022-02-10 DIAGNOSIS — L03031 Cellulitis of right toe: Secondary | ICD-10-CM | POA: Diagnosis not present

## 2022-02-10 DIAGNOSIS — J449 Chronic obstructive pulmonary disease, unspecified: Secondary | ICD-10-CM | POA: Diagnosis not present

## 2022-02-15 DIAGNOSIS — E1121 Type 2 diabetes mellitus with diabetic nephropathy: Secondary | ICD-10-CM | POA: Diagnosis not present

## 2022-02-15 DIAGNOSIS — I10 Essential (primary) hypertension: Secondary | ICD-10-CM | POA: Diagnosis not present

## 2022-02-15 DIAGNOSIS — E538 Deficiency of other specified B group vitamins: Secondary | ICD-10-CM | POA: Diagnosis not present

## 2022-02-15 DIAGNOSIS — D509 Iron deficiency anemia, unspecified: Secondary | ICD-10-CM | POA: Diagnosis not present

## 2022-02-15 DIAGNOSIS — E559 Vitamin D deficiency, unspecified: Secondary | ICD-10-CM | POA: Diagnosis not present

## 2022-02-20 DIAGNOSIS — E1121 Type 2 diabetes mellitus with diabetic nephropathy: Secondary | ICD-10-CM | POA: Diagnosis not present

## 2022-02-20 DIAGNOSIS — N189 Chronic kidney disease, unspecified: Secondary | ICD-10-CM | POA: Diagnosis not present

## 2022-02-20 DIAGNOSIS — J449 Chronic obstructive pulmonary disease, unspecified: Secondary | ICD-10-CM | POA: Diagnosis not present

## 2022-02-20 DIAGNOSIS — I10 Essential (primary) hypertension: Secondary | ICD-10-CM | POA: Diagnosis not present

## 2022-02-20 DIAGNOSIS — L03031 Cellulitis of right toe: Secondary | ICD-10-CM | POA: Diagnosis not present

## 2022-02-20 DIAGNOSIS — E559 Vitamin D deficiency, unspecified: Secondary | ICD-10-CM | POA: Diagnosis not present

## 2022-02-20 DIAGNOSIS — G4700 Insomnia, unspecified: Secondary | ICD-10-CM | POA: Diagnosis not present

## 2022-02-20 DIAGNOSIS — E538 Deficiency of other specified B group vitamins: Secondary | ICD-10-CM | POA: Diagnosis not present

## 2022-02-20 DIAGNOSIS — E785 Hyperlipidemia, unspecified: Secondary | ICD-10-CM | POA: Diagnosis not present

## 2022-02-20 DIAGNOSIS — R053 Chronic cough: Secondary | ICD-10-CM | POA: Diagnosis not present

## 2022-02-23 DIAGNOSIS — M79676 Pain in unspecified toe(s): Secondary | ICD-10-CM | POA: Diagnosis not present

## 2022-02-23 DIAGNOSIS — L84 Corns and callosities: Secondary | ICD-10-CM | POA: Diagnosis not present

## 2022-02-23 DIAGNOSIS — B351 Tinea unguium: Secondary | ICD-10-CM | POA: Diagnosis not present

## 2022-02-23 DIAGNOSIS — E1142 Type 2 diabetes mellitus with diabetic polyneuropathy: Secondary | ICD-10-CM | POA: Diagnosis not present

## 2022-03-01 DIAGNOSIS — M961 Postlaminectomy syndrome, not elsewhere classified: Secondary | ICD-10-CM | POA: Diagnosis not present

## 2022-03-01 DIAGNOSIS — Z5181 Encounter for therapeutic drug level monitoring: Secondary | ICD-10-CM | POA: Diagnosis not present

## 2022-03-01 DIAGNOSIS — M5416 Radiculopathy, lumbar region: Secondary | ICD-10-CM | POA: Diagnosis not present

## 2022-03-01 DIAGNOSIS — Z79899 Other long term (current) drug therapy: Secondary | ICD-10-CM | POA: Diagnosis not present

## 2022-03-10 DIAGNOSIS — R350 Frequency of micturition: Secondary | ICD-10-CM | POA: Diagnosis not present

## 2022-03-10 DIAGNOSIS — E119 Type 2 diabetes mellitus without complications: Secondary | ICD-10-CM | POA: Diagnosis not present

## 2022-03-10 DIAGNOSIS — I10 Essential (primary) hypertension: Secondary | ICD-10-CM | POA: Diagnosis not present

## 2022-03-31 DIAGNOSIS — E538 Deficiency of other specified B group vitamins: Secondary | ICD-10-CM | POA: Diagnosis not present

## 2022-04-09 DIAGNOSIS — I129 Hypertensive chronic kidney disease with stage 1 through stage 4 chronic kidney disease, or unspecified chronic kidney disease: Secondary | ICD-10-CM | POA: Diagnosis not present

## 2022-04-09 DIAGNOSIS — E1121 Type 2 diabetes mellitus with diabetic nephropathy: Secondary | ICD-10-CM | POA: Diagnosis not present

## 2022-04-09 DIAGNOSIS — J449 Chronic obstructive pulmonary disease, unspecified: Secondary | ICD-10-CM | POA: Diagnosis not present

## 2022-04-09 DIAGNOSIS — N189 Chronic kidney disease, unspecified: Secondary | ICD-10-CM | POA: Diagnosis not present

## 2022-05-10 DIAGNOSIS — I10 Essential (primary) hypertension: Secondary | ICD-10-CM | POA: Diagnosis not present

## 2022-05-16 ENCOUNTER — Other Ambulatory Visit (HOSPITAL_COMMUNITY): Payer: Self-pay | Admitting: Internal Medicine

## 2022-05-17 ENCOUNTER — Other Ambulatory Visit (HOSPITAL_COMMUNITY): Payer: Self-pay | Admitting: Internal Medicine

## 2022-05-17 ENCOUNTER — Other Ambulatory Visit (HOSPITAL_COMMUNITY): Payer: Self-pay | Admitting: Family Medicine

## 2022-05-17 DIAGNOSIS — N6310 Unspecified lump in the right breast, unspecified quadrant: Secondary | ICD-10-CM | POA: Diagnosis not present

## 2022-05-17 DIAGNOSIS — B372 Candidiasis of skin and nail: Secondary | ICD-10-CM | POA: Diagnosis not present

## 2022-05-26 DIAGNOSIS — E538 Deficiency of other specified B group vitamins: Secondary | ICD-10-CM | POA: Diagnosis not present

## 2022-05-26 DIAGNOSIS — E785 Hyperlipidemia, unspecified: Secondary | ICD-10-CM | POA: Diagnosis not present

## 2022-05-26 DIAGNOSIS — I10 Essential (primary) hypertension: Secondary | ICD-10-CM | POA: Diagnosis not present

## 2022-05-26 DIAGNOSIS — E559 Vitamin D deficiency, unspecified: Secondary | ICD-10-CM | POA: Diagnosis not present

## 2022-05-26 DIAGNOSIS — E1121 Type 2 diabetes mellitus with diabetic nephropathy: Secondary | ICD-10-CM | POA: Diagnosis not present

## 2022-05-30 ENCOUNTER — Encounter (HOSPITAL_COMMUNITY): Payer: Self-pay

## 2022-05-30 ENCOUNTER — Ambulatory Visit (HOSPITAL_COMMUNITY): Payer: Medicare Other

## 2022-05-30 ENCOUNTER — Encounter (HOSPITAL_COMMUNITY): Payer: Medicare Other

## 2022-06-01 ENCOUNTER — Encounter (HOSPITAL_COMMUNITY): Payer: Medicare Other

## 2022-06-01 ENCOUNTER — Other Ambulatory Visit (HOSPITAL_COMMUNITY): Payer: Medicare Other

## 2022-06-01 DIAGNOSIS — E1121 Type 2 diabetes mellitus with diabetic nephropathy: Secondary | ICD-10-CM | POA: Diagnosis not present

## 2022-06-01 DIAGNOSIS — E538 Deficiency of other specified B group vitamins: Secondary | ICD-10-CM | POA: Diagnosis not present

## 2022-06-01 DIAGNOSIS — N189 Chronic kidney disease, unspecified: Secondary | ICD-10-CM | POA: Diagnosis not present

## 2022-06-01 DIAGNOSIS — R809 Proteinuria, unspecified: Secondary | ICD-10-CM | POA: Diagnosis not present

## 2022-06-01 DIAGNOSIS — M25561 Pain in right knee: Secondary | ICD-10-CM | POA: Diagnosis not present

## 2022-06-01 DIAGNOSIS — E785 Hyperlipidemia, unspecified: Secondary | ICD-10-CM | POA: Diagnosis not present

## 2022-06-01 DIAGNOSIS — E559 Vitamin D deficiency, unspecified: Secondary | ICD-10-CM | POA: Diagnosis not present

## 2022-06-01 DIAGNOSIS — J449 Chronic obstructive pulmonary disease, unspecified: Secondary | ICD-10-CM | POA: Diagnosis not present

## 2022-06-01 DIAGNOSIS — G43909 Migraine, unspecified, not intractable, without status migrainosus: Secondary | ICD-10-CM | POA: Diagnosis not present

## 2022-06-01 DIAGNOSIS — M810 Age-related osteoporosis without current pathological fracture: Secondary | ICD-10-CM | POA: Diagnosis not present

## 2022-06-01 DIAGNOSIS — I10 Essential (primary) hypertension: Secondary | ICD-10-CM | POA: Diagnosis not present

## 2022-06-01 DIAGNOSIS — G4700 Insomnia, unspecified: Secondary | ICD-10-CM | POA: Diagnosis not present

## 2022-06-09 DIAGNOSIS — I129 Hypertensive chronic kidney disease with stage 1 through stage 4 chronic kidney disease, or unspecified chronic kidney disease: Secondary | ICD-10-CM | POA: Diagnosis not present

## 2022-06-09 DIAGNOSIS — J449 Chronic obstructive pulmonary disease, unspecified: Secondary | ICD-10-CM | POA: Diagnosis not present

## 2022-06-09 DIAGNOSIS — N189 Chronic kidney disease, unspecified: Secondary | ICD-10-CM | POA: Diagnosis not present

## 2022-06-09 DIAGNOSIS — E1121 Type 2 diabetes mellitus with diabetic nephropathy: Secondary | ICD-10-CM | POA: Diagnosis not present

## 2022-06-28 DIAGNOSIS — M961 Postlaminectomy syndrome, not elsewhere classified: Secondary | ICD-10-CM | POA: Diagnosis not present

## 2022-06-28 DIAGNOSIS — M5416 Radiculopathy, lumbar region: Secondary | ICD-10-CM | POA: Diagnosis not present

## 2022-07-10 DIAGNOSIS — E1121 Type 2 diabetes mellitus with diabetic nephropathy: Secondary | ICD-10-CM | POA: Diagnosis not present

## 2022-07-10 DIAGNOSIS — J449 Chronic obstructive pulmonary disease, unspecified: Secondary | ICD-10-CM | POA: Diagnosis not present

## 2022-07-10 DIAGNOSIS — I129 Hypertensive chronic kidney disease with stage 1 through stage 4 chronic kidney disease, or unspecified chronic kidney disease: Secondary | ICD-10-CM | POA: Diagnosis not present

## 2022-07-10 DIAGNOSIS — N189 Chronic kidney disease, unspecified: Secondary | ICD-10-CM | POA: Diagnosis not present

## 2022-08-18 DIAGNOSIS — E538 Deficiency of other specified B group vitamins: Secondary | ICD-10-CM | POA: Diagnosis not present

## 2022-08-18 DIAGNOSIS — Z23 Encounter for immunization: Secondary | ICD-10-CM | POA: Diagnosis not present

## 2022-10-02 DIAGNOSIS — E538 Deficiency of other specified B group vitamins: Secondary | ICD-10-CM | POA: Diagnosis not present

## 2022-11-28 DIAGNOSIS — M961 Postlaminectomy syndrome, not elsewhere classified: Secondary | ICD-10-CM | POA: Diagnosis not present

## 2022-11-30 DIAGNOSIS — E785 Hyperlipidemia, unspecified: Secondary | ICD-10-CM | POA: Diagnosis not present

## 2022-11-30 DIAGNOSIS — E559 Vitamin D deficiency, unspecified: Secondary | ICD-10-CM | POA: Diagnosis not present

## 2022-11-30 DIAGNOSIS — E1121 Type 2 diabetes mellitus with diabetic nephropathy: Secondary | ICD-10-CM | POA: Diagnosis not present

## 2022-11-30 DIAGNOSIS — I10 Essential (primary) hypertension: Secondary | ICD-10-CM | POA: Diagnosis not present

## 2022-12-07 DIAGNOSIS — Z0000 Encounter for general adult medical examination without abnormal findings: Secondary | ICD-10-CM | POA: Diagnosis not present

## 2022-12-07 DIAGNOSIS — E538 Deficiency of other specified B group vitamins: Secondary | ICD-10-CM | POA: Diagnosis not present

## 2022-12-07 DIAGNOSIS — I129 Hypertensive chronic kidney disease with stage 1 through stage 4 chronic kidney disease, or unspecified chronic kidney disease: Secondary | ICD-10-CM | POA: Diagnosis not present

## 2022-12-07 DIAGNOSIS — G43909 Migraine, unspecified, not intractable, without status migrainosus: Secondary | ICD-10-CM | POA: Diagnosis not present

## 2022-12-07 DIAGNOSIS — Z0001 Encounter for general adult medical examination with abnormal findings: Secondary | ICD-10-CM | POA: Diagnosis not present

## 2022-12-07 DIAGNOSIS — J449 Chronic obstructive pulmonary disease, unspecified: Secondary | ICD-10-CM | POA: Diagnosis not present

## 2022-12-07 DIAGNOSIS — E1121 Type 2 diabetes mellitus with diabetic nephropathy: Secondary | ICD-10-CM | POA: Diagnosis not present

## 2022-12-07 DIAGNOSIS — G4700 Insomnia, unspecified: Secondary | ICD-10-CM | POA: Diagnosis not present

## 2022-12-07 DIAGNOSIS — L298 Other pruritus: Secondary | ICD-10-CM | POA: Diagnosis not present

## 2022-12-07 DIAGNOSIS — E785 Hyperlipidemia, unspecified: Secondary | ICD-10-CM | POA: Diagnosis not present

## 2022-12-07 DIAGNOSIS — N189 Chronic kidney disease, unspecified: Secondary | ICD-10-CM | POA: Diagnosis not present

## 2022-12-07 DIAGNOSIS — R809 Proteinuria, unspecified: Secondary | ICD-10-CM | POA: Diagnosis not present

## 2023-01-01 DIAGNOSIS — M1712 Unilateral primary osteoarthritis, left knee: Secondary | ICD-10-CM | POA: Diagnosis not present

## 2023-01-01 DIAGNOSIS — M7051 Other bursitis of knee, right knee: Secondary | ICD-10-CM | POA: Diagnosis not present

## 2023-01-01 DIAGNOSIS — Z96651 Presence of right artificial knee joint: Secondary | ICD-10-CM | POA: Diagnosis not present

## 2023-01-19 DIAGNOSIS — Z96659 Presence of unspecified artificial knee joint: Secondary | ICD-10-CM | POA: Diagnosis not present

## 2023-01-19 DIAGNOSIS — M978XXA Periprosthetic fracture around other internal prosthetic joint, initial encounter: Secondary | ICD-10-CM | POA: Diagnosis not present

## 2023-01-19 DIAGNOSIS — M810 Age-related osteoporosis without current pathological fracture: Secondary | ICD-10-CM | POA: Diagnosis not present

## 2023-01-25 DIAGNOSIS — M810 Age-related osteoporosis without current pathological fracture: Secondary | ICD-10-CM | POA: Diagnosis not present

## 2023-03-13 DIAGNOSIS — E1142 Type 2 diabetes mellitus with diabetic polyneuropathy: Secondary | ICD-10-CM | POA: Diagnosis not present

## 2023-03-13 DIAGNOSIS — M79676 Pain in unspecified toe(s): Secondary | ICD-10-CM | POA: Diagnosis not present

## 2023-03-13 DIAGNOSIS — B351 Tinea unguium: Secondary | ICD-10-CM | POA: Diagnosis not present

## 2023-03-13 DIAGNOSIS — L84 Corns and callosities: Secondary | ICD-10-CM | POA: Diagnosis not present

## 2023-03-26 DIAGNOSIS — Z8781 Personal history of (healed) traumatic fracture: Secondary | ICD-10-CM | POA: Diagnosis not present

## 2023-03-26 DIAGNOSIS — M1712 Unilateral primary osteoarthritis, left knee: Secondary | ICD-10-CM | POA: Diagnosis not present

## 2023-03-26 DIAGNOSIS — Z713 Dietary counseling and surveillance: Secondary | ICD-10-CM | POA: Diagnosis not present

## 2023-03-26 DIAGNOSIS — Z7182 Exercise counseling: Secondary | ICD-10-CM | POA: Diagnosis not present

## 2023-03-26 DIAGNOSIS — Z79899 Other long term (current) drug therapy: Secondary | ICD-10-CM | POA: Diagnosis not present

## 2023-03-26 DIAGNOSIS — I10 Essential (primary) hypertension: Secondary | ICD-10-CM | POA: Diagnosis not present

## 2023-03-26 DIAGNOSIS — M25561 Pain in right knee: Secondary | ICD-10-CM | POA: Diagnosis not present

## 2023-03-26 DIAGNOSIS — R42 Dizziness and giddiness: Secondary | ICD-10-CM | POA: Diagnosis not present

## 2023-03-26 DIAGNOSIS — M810 Age-related osteoporosis without current pathological fracture: Secondary | ICD-10-CM | POA: Diagnosis not present

## 2023-04-02 DIAGNOSIS — M961 Postlaminectomy syndrome, not elsewhere classified: Secondary | ICD-10-CM | POA: Diagnosis not present

## 2023-04-02 DIAGNOSIS — R296 Repeated falls: Secondary | ICD-10-CM | POA: Diagnosis not present

## 2023-04-02 DIAGNOSIS — Z79899 Other long term (current) drug therapy: Secondary | ICD-10-CM | POA: Diagnosis not present

## 2023-04-02 DIAGNOSIS — M5416 Radiculopathy, lumbar region: Secondary | ICD-10-CM | POA: Diagnosis not present

## 2023-04-02 DIAGNOSIS — Z5181 Encounter for therapeutic drug level monitoring: Secondary | ICD-10-CM | POA: Diagnosis not present

## 2023-04-27 DIAGNOSIS — Z713 Dietary counseling and surveillance: Secondary | ICD-10-CM | POA: Diagnosis not present

## 2023-04-27 DIAGNOSIS — Z7182 Exercise counseling: Secondary | ICD-10-CM | POA: Diagnosis not present

## 2023-04-27 DIAGNOSIS — N189 Chronic kidney disease, unspecified: Secondary | ICD-10-CM | POA: Diagnosis not present

## 2023-04-27 DIAGNOSIS — I10 Essential (primary) hypertension: Secondary | ICD-10-CM | POA: Diagnosis not present

## 2023-04-27 DIAGNOSIS — I129 Hypertensive chronic kidney disease with stage 1 through stage 4 chronic kidney disease, or unspecified chronic kidney disease: Secondary | ICD-10-CM | POA: Diagnosis not present

## 2023-04-27 DIAGNOSIS — R42 Dizziness and giddiness: Secondary | ICD-10-CM | POA: Diagnosis not present

## 2023-05-16 DIAGNOSIS — R053 Chronic cough: Secondary | ICD-10-CM | POA: Diagnosis not present

## 2023-05-16 DIAGNOSIS — J302 Other seasonal allergic rhinitis: Secondary | ICD-10-CM | POA: Diagnosis not present

## 2023-05-16 DIAGNOSIS — J449 Chronic obstructive pulmonary disease, unspecified: Secondary | ICD-10-CM | POA: Diagnosis not present

## 2023-05-16 DIAGNOSIS — J029 Acute pharyngitis, unspecified: Secondary | ICD-10-CM | POA: Diagnosis not present

## 2023-05-16 DIAGNOSIS — R059 Cough, unspecified: Secondary | ICD-10-CM | POA: Diagnosis not present

## 2023-05-21 DIAGNOSIS — R42 Dizziness and giddiness: Secondary | ICD-10-CM | POA: Diagnosis not present

## 2023-05-29 DIAGNOSIS — M79676 Pain in unspecified toe(s): Secondary | ICD-10-CM | POA: Diagnosis not present

## 2023-05-29 DIAGNOSIS — L84 Corns and callosities: Secondary | ICD-10-CM | POA: Diagnosis not present

## 2023-05-29 DIAGNOSIS — B351 Tinea unguium: Secondary | ICD-10-CM | POA: Diagnosis not present

## 2023-05-29 DIAGNOSIS — E1142 Type 2 diabetes mellitus with diabetic polyneuropathy: Secondary | ICD-10-CM | POA: Diagnosis not present

## 2023-06-04 DIAGNOSIS — E785 Hyperlipidemia, unspecified: Secondary | ICD-10-CM | POA: Diagnosis not present

## 2023-06-04 DIAGNOSIS — E559 Vitamin D deficiency, unspecified: Secondary | ICD-10-CM | POA: Diagnosis not present

## 2023-06-04 DIAGNOSIS — D509 Iron deficiency anemia, unspecified: Secondary | ICD-10-CM | POA: Diagnosis not present

## 2023-06-04 DIAGNOSIS — I10 Essential (primary) hypertension: Secondary | ICD-10-CM | POA: Diagnosis not present

## 2023-06-04 DIAGNOSIS — E538 Deficiency of other specified B group vitamins: Secondary | ICD-10-CM | POA: Diagnosis not present

## 2023-06-07 DIAGNOSIS — H04123 Dry eye syndrome of bilateral lacrimal glands: Secondary | ICD-10-CM | POA: Diagnosis not present

## 2023-06-08 DIAGNOSIS — R809 Proteinuria, unspecified: Secondary | ICD-10-CM | POA: Diagnosis not present

## 2023-06-08 DIAGNOSIS — Z0001 Encounter for general adult medical examination with abnormal findings: Secondary | ICD-10-CM | POA: Diagnosis not present

## 2023-06-08 DIAGNOSIS — E785 Hyperlipidemia, unspecified: Secondary | ICD-10-CM | POA: Diagnosis not present

## 2023-06-08 DIAGNOSIS — E559 Vitamin D deficiency, unspecified: Secondary | ICD-10-CM | POA: Diagnosis not present

## 2023-06-08 DIAGNOSIS — J449 Chronic obstructive pulmonary disease, unspecified: Secondary | ICD-10-CM | POA: Diagnosis not present

## 2023-06-08 DIAGNOSIS — G4700 Insomnia, unspecified: Secondary | ICD-10-CM | POA: Diagnosis not present

## 2023-06-08 DIAGNOSIS — E1122 Type 2 diabetes mellitus with diabetic chronic kidney disease: Secondary | ICD-10-CM | POA: Diagnosis not present

## 2023-06-08 DIAGNOSIS — I129 Hypertensive chronic kidney disease with stage 1 through stage 4 chronic kidney disease, or unspecified chronic kidney disease: Secondary | ICD-10-CM | POA: Diagnosis not present

## 2023-06-08 DIAGNOSIS — Z0000 Encounter for general adult medical examination without abnormal findings: Secondary | ICD-10-CM | POA: Diagnosis not present

## 2023-06-08 DIAGNOSIS — E538 Deficiency of other specified B group vitamins: Secondary | ICD-10-CM | POA: Diagnosis not present

## 2023-06-08 DIAGNOSIS — N189 Chronic kidney disease, unspecified: Secondary | ICD-10-CM | POA: Diagnosis not present

## 2023-06-08 DIAGNOSIS — M609 Myositis, unspecified: Secondary | ICD-10-CM | POA: Diagnosis not present

## 2023-06-26 DIAGNOSIS — T148XXA Other injury of unspecified body region, initial encounter: Secondary | ICD-10-CM | POA: Diagnosis not present

## 2023-06-26 DIAGNOSIS — W57XXXA Bitten or stung by nonvenomous insect and other nonvenomous arthropods, initial encounter: Secondary | ICD-10-CM | POA: Diagnosis not present

## 2023-06-26 DIAGNOSIS — S90562A Insect bite (nonvenomous), left ankle, initial encounter: Secondary | ICD-10-CM | POA: Diagnosis not present

## 2023-06-27 DIAGNOSIS — R42 Dizziness and giddiness: Secondary | ICD-10-CM | POA: Diagnosis not present

## 2023-06-27 DIAGNOSIS — H903 Sensorineural hearing loss, bilateral: Secondary | ICD-10-CM | POA: Diagnosis not present

## 2023-08-02 DIAGNOSIS — M961 Postlaminectomy syndrome, not elsewhere classified: Secondary | ICD-10-CM | POA: Diagnosis not present

## 2023-08-02 DIAGNOSIS — M5416 Radiculopathy, lumbar region: Secondary | ICD-10-CM | POA: Diagnosis not present

## 2023-08-02 DIAGNOSIS — R296 Repeated falls: Secondary | ICD-10-CM | POA: Diagnosis not present

## 2023-08-07 DIAGNOSIS — B351 Tinea unguium: Secondary | ICD-10-CM | POA: Diagnosis not present

## 2023-08-07 DIAGNOSIS — E1142 Type 2 diabetes mellitus with diabetic polyneuropathy: Secondary | ICD-10-CM | POA: Diagnosis not present

## 2023-08-07 DIAGNOSIS — M79676 Pain in unspecified toe(s): Secondary | ICD-10-CM | POA: Diagnosis not present

## 2023-08-07 DIAGNOSIS — L84 Corns and callosities: Secondary | ICD-10-CM | POA: Diagnosis not present

## 2023-08-09 DIAGNOSIS — J069 Acute upper respiratory infection, unspecified: Secondary | ICD-10-CM | POA: Diagnosis not present

## 2023-08-09 DIAGNOSIS — R42 Dizziness and giddiness: Secondary | ICD-10-CM | POA: Diagnosis not present

## 2023-08-09 DIAGNOSIS — R059 Cough, unspecified: Secondary | ICD-10-CM | POA: Diagnosis not present

## 2023-08-09 DIAGNOSIS — J019 Acute sinusitis, unspecified: Secondary | ICD-10-CM | POA: Diagnosis not present

## 2023-08-16 DIAGNOSIS — M25561 Pain in right knee: Secondary | ICD-10-CM | POA: Diagnosis not present

## 2023-08-16 DIAGNOSIS — M25562 Pain in left knee: Secondary | ICD-10-CM | POA: Diagnosis not present

## 2023-08-16 DIAGNOSIS — M79672 Pain in left foot: Secondary | ICD-10-CM | POA: Diagnosis not present

## 2023-08-22 DIAGNOSIS — J069 Acute upper respiratory infection, unspecified: Secondary | ICD-10-CM | POA: Diagnosis not present

## 2023-08-28 ENCOUNTER — Other Ambulatory Visit (HOSPITAL_COMMUNITY): Payer: Self-pay | Admitting: Family Medicine

## 2023-08-28 DIAGNOSIS — J069 Acute upper respiratory infection, unspecified: Secondary | ICD-10-CM

## 2023-08-29 ENCOUNTER — Ambulatory Visit (HOSPITAL_COMMUNITY)
Admission: RE | Admit: 2023-08-29 | Discharge: 2023-08-29 | Disposition: A | Discharge status: Home / Self Care | Payer: Medicare Other | Source: Ambulatory Visit | Attending: Family Medicine | Admitting: Family Medicine

## 2023-08-29 DIAGNOSIS — R918 Other nonspecific abnormal finding of lung field: Secondary | ICD-10-CM | POA: Diagnosis not present

## 2023-08-29 DIAGNOSIS — J069 Acute upper respiratory infection, unspecified: Secondary | ICD-10-CM | POA: Diagnosis not present

## 2023-08-29 DIAGNOSIS — R059 Cough, unspecified: Secondary | ICD-10-CM | POA: Diagnosis not present

## 2023-08-29 DIAGNOSIS — M129 Arthropathy, unspecified: Secondary | ICD-10-CM | POA: Diagnosis not present

## 2023-10-20 ENCOUNTER — Ambulatory Visit: Payer: Medicare Other

## 2023-10-20 ENCOUNTER — Ambulatory Visit
Admission: EM | Admit: 2023-10-20 | Discharge: 2023-10-20 | Disposition: A | Discharge status: Home / Self Care | Payer: Medicare Other | Attending: Nurse Practitioner | Admitting: Nurse Practitioner

## 2023-10-20 DIAGNOSIS — J22 Unspecified acute lower respiratory infection: Secondary | ICD-10-CM | POA: Diagnosis not present

## 2023-10-20 DIAGNOSIS — J441 Chronic obstructive pulmonary disease with (acute) exacerbation: Secondary | ICD-10-CM | POA: Diagnosis not present

## 2023-10-20 DIAGNOSIS — R059 Cough, unspecified: Secondary | ICD-10-CM | POA: Diagnosis not present

## 2023-10-20 DIAGNOSIS — R0602 Shortness of breath: Secondary | ICD-10-CM | POA: Diagnosis not present

## 2023-10-20 DIAGNOSIS — R918 Other nonspecific abnormal finding of lung field: Secondary | ICD-10-CM | POA: Diagnosis not present

## 2023-10-20 MED ORDER — METHYLPREDNISOLONE SODIUM SUCC 125 MG IJ SOLR
80.0000 mg | Freq: Once | INTRAMUSCULAR | Status: AC
  Administered 2023-10-20: 80 mg via INTRAMUSCULAR

## 2023-10-20 MED ORDER — ALBUTEROL SULFATE (2.5 MG/3ML) 0.083% IN NEBU: 2.5000 mg | INHALATION_SOLUTION | Freq: Four times a day (QID) | RESPIRATORY_TRACT | 12 refills | Status: DC | PRN

## 2023-10-20 MED ORDER — PROMETHAZINE-DM 6.25-15 MG/5ML PO SYRP: 5.0000 mL | ORAL_SOLUTION | Freq: Every evening | ORAL | 0 refills | Status: DC | PRN

## 2023-10-20 MED ORDER — DOXYCYCLINE HYCLATE 100 MG PO TABS: 100.0000 mg | ORAL_TABLET | Freq: Two times a day (BID) | ORAL | 0 refills | Status: AC

## 2023-10-20 MED ORDER — ALBUTEROL SULFATE (2.5 MG/3ML) 0.083% IN NEBU
2.5000 mg | INHALATION_SOLUTION | Freq: Once | RESPIRATORY_TRACT | Status: AC
  Administered 2023-10-20: 2.5 mg via RESPIRATORY_TRACT

## 2023-10-20 MED ORDER — PREDNISONE 20 MG PO TABS: 40.0000 mg | ORAL_TABLET | Freq: Every day | ORAL | 0 refills | Status: AC

## 2023-10-20 NOTE — ED Provider Notes (Addendum)
[facility]    CSN: [id number] Arrival date & time: 10/20/23  1225      History   Chief Complaint Chief Complaint  Patient presents with   Cough    HPI Alexis Rios is a 77 y.o. female.   The history is provided by the patient.   Patient presents for complaints of cough that has been present for the past 3 weeks.  Patient reports he was diagnosed previously with bronchitis.  Per her chart review, patient also has a history of COPD.  Patient also complains of wheezing, and states cough has been productive.  She also endorses shortness of breath.  Patient reports that she has had intermittent nasal congestion and runny nose.  Patient denies ear pain, headache, sore throat, chest pain, abdominal pain, nausea, vomiting, or diarrhea.  Patient reports that she has been taking NyQuil, BC Goody powder, and Alka-Seltzer with minimal relief.  Patient reports 2 months ago, she did have an upper respiratory infection that took a while to get better.  She states that as soon as her symptoms improve, she began to feel ill again.  Patient denies prior history of smoking.  She does have an albuterol inhaler at home, reports she did not or has not used it since her symptoms started.  Past Medical History:  Diagnosis Date   Anemia    Anxiety    Arthritis    Asthma    CAD (coronary artery disease)    Cath 2009, AV groove CX 30%, RCA 20 %   Candidiasis of skin and nail    Carpal tunnel syndrome    Chronic back pain    Chronic headaches    migraines   Complication of anesthesia    if no breathing tx before anesthesia wakes with asthma   COPD (chronic obstructive pulmonary disease) (HCC)    Depression    Diabetes mellitus    DIET CONTROLLED NOW   Diverticulosis    Gallstones    Gastroparesis    ?   GERD (gastroesophageal reflux disease)    H/O echocardiogram    Echo 09/2011: Mild LVH, EF 60-65%, grade 1 diastolic dysfunction, trivial AI, mild MR, mild LAE.   H/O exercise  stress test    Dobutamine Myoview 09/2011: No scar or ischemia, no EKG changes, study not gated.   HH (hiatus hernia)    History of gallstones    History of pneumonia    Hypercholesteremia    Hypertension    IBS (irritable bowel syndrome)    Osteoporosis    Peripheral neuropathy    LEGS AND FEET   Pneumonia LAST TIME 8 YRS AGO   S/P cardiac catheterization    LHC 03/2008: Mid AV groove circumflex 30%, mid RCA 20%, EF 60%   Seasonal allergies    Skin cancer    scc/bcc   Skin yeast infection 07/05/2015   Sleep apnea    cpap    Stones in the urinary tract    Subclinical hypothyroidism    UTI (urinary tract infection)     Patient Active Problem List   Diagnosis Date Noted   Osteoporosis 05/19/2021   H/O healed fragility fracture 09/22/2020   Hypertension 09/25/2019   Periprosthetic fracture around prosthetic knee 10/23/2018   Periprosthetic fracture of shaft of femur 10/22/2018   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 10/22/2018   Osteoarthritis of right knee 10/10/2018   Skin yeast infection 07/05/2015   CAD (coronary artery disease)  Chest pain 11/13/2013   Intermittent lightheadedness 11/13/2013   GERD (gastroesophageal reflux disease) 09/09/2012   Obesity 06/25/2012   Disorder of shoulder 06/04/2012   Asthma 05/28/2012   Diastolic dysfunction 05/28/2012   OSA (obstructive sleep apnea) 05/10/2012   Dyspnea 05/07/2012   Dyslipidemia 08/24/2011    Past Surgical History:  Procedure Laterality Date   ABDOMINAL HYSTERECTOMY     partial   APPENDECTOMY     ARTHOSCOPIC ROTAOR CUFF REPAIR Right    X 2   BACK SURGERY     X 4   BACK SURGERY     BREAST SURGERY     lump x 2 R   CHOLECYSTECTOMY     EYE SURGERY  2018   IOC FOR CATARACTS BOTH EYES,    EYE SURGERY  2019   YAG LASER FOR CATARACT FILM   FOOT SURGERY     R   KNEE ARTHROPLASTY Right 10/10/2018   Procedure: RIGHT TOTAL KNEE ARTHROPLASTY WITH COMPUTER NAVIGATION;  Surgeon:  Samson Frederic, MD;  Location: [facility];  Service: Orthopedics;  Laterality: Right;  Needs RNFA   KNEE SURGERY     R   NOSE SURGERY     skin cancer excision   ORIF PERIPROSTHETIC FRACTURE Right 10/23/2018   Procedure: OPEN REDUCTION INTERNAL FIXATION (ORIF) RIGHT  PERIPROSTHETIC FRACTURE;  Surgeon: Samson Frederic, MD;  Location: [facility];  Service: Orthopedics;  Laterality: Right;   RIGHT WRIST SURGERY WITH PLATE  84/69/6295   shoulder surg     x2 R   TOE AMPUTATION     rightX 1 in past LEFT X 1 done sept 2019 at [facility]   TONSILLECTOMY     UMBILICAL HERNIA REPAIR     WRIST SURGERY     L    OB History   No obstetric history on file.      Home Medications    Prior to Admission medications   Medication Sig Start Date End Date Taking? Authorizing Provider  albuterol (PROVENTIL) (2.5 MG/3ML) 0.083% nebulizer solution Take 3 mLs (2.5 mg total) by nebulization every 6 (six) hours as needed for wheezing or shortness of breath. 10/20/23  Yes Leath-Warren, Sadie Haber, NP  doxycycline (VIBRA-TABS) 100 MG tablet Take 1 tablet (100 mg total) by mouth 2 (two) times daily for 7 days. 10/20/23 10/27/23 Yes Leath-Warren, Sadie Haber, NP  predniSONE (DELTASONE) 20 MG tablet Take 2 tablets (40 mg total) by mouth daily with breakfast for 5 days. 10/20/23 10/25/23 Yes Leath-Warren, Sadie Haber, NP  promethazine-dextromethorphan (PROMETHAZINE-DM) 6.25-15 MG/5ML syrup Take 5 mLs by mouth at bedtime as needed. 10/20/23  Yes Leath-Warren, Sadie Haber, NP  ALLERGY RELIEF 10 MG tablet Take 10 mg by mouth daily. 05/24/21   [provider]  ALPRAZolam Prudy Feeler) 0.25 MG tablet Take 0.25 mg by mouth 2 (two) times daily.    [provider]  budesonide-formoterol (SYMBICORT) 80-4.5 MCG/ACT inhaler Inhale 2 puffs into the lungs at bedtime.     [provider]  calcium carbonate (OS-CAL - DOSED IN MG OF ELEMENTAL CALCIUM) 1250 (500 Ca) MG tablet calcium carbonate 500 mg calcium (1,250 mg) tablet  TAKE  ONE TABLET BY MOUTH ONCE DAILY FOR hypocalcemia    [provider]  citalopram (CELEXA) 40 MG tablet Take 40 mg by mouth at bedtime.     [provider]  diclofenac Sodium (VOLTAREN) 1 % GEL Apply 2-4 g topically as needed.    [provider]  diphenhydrAMINE (BENADRYL) 25 mg capsule Take 1  capsule (25 mg total) by mouth every 6 (six) hours as needed for itching. 10/25/18   Swinteck, Arlys John, MD  gabapentin (NEURONTIN) 400 MG capsule Take 400 mg by mouth at bedtime.    [provider]  HYDROcodone-acetaminophen (NORCO/VICODIN) 5-325 MG tablet Take 1 tablet by mouth 3 (three) times daily.    [provider]  lisinopril (ZESTRIL) 5 MG tablet Take 5 mg by mouth daily. 12/27/20   [provider]  ondansetron (ZOFRAN) 4 MG tablet Take 1 tablet (4 mg total) by mouth every 6 (six) hours as needed for nausea. 10/11/18   Swinteck, Arlys John, MD  pantoprazole (PROTONIX) 40 MG tablet Take 1 tablet (40 mg total) by mouth 2 (two) times daily. Take 30 minutes before breakfast. 06/21/21 09/19/21  Arnaldo Natal, NP  risedronate (ACTONEL) 150 MG tablet Take 150 mg by mouth every 30 (thirty) days. with water on empty stomach, nothing by mouth or lie down for next 30 minutes.    [provider]  rosuvastatin (CRESTOR) 5 MG tablet Take 5 mg by mouth every Sunday.  08/27/18   [provider]  topiramate (TOPAMAX) 100 MG tablet Take 150 mg by mouth at bedtime.    [provider]  triamcinolone cream (KENALOG) 0.1 % Apply 1 application topically 2 (two) times daily as needed. 05/24/21   [provider]  Vitamin D, Ergocalciferol, (DRISDOL) 50000 units CAPS capsule Take 50,000 Units by mouth every Saturday.     [provider]    Family History Family History  Problem Relation Age of Onset   Heart disease Mother    Hypertension Mother    Stroke Mother    Heart disease Father    Stroke Father    Hypertension Sister     Diabetes Sister    Liver cancer Brother    Hypertension Brother    Diabetes Brother    Leukemia Brother    Emphysema Brother    Allergies Daughter    Diabetes Other        all brothers and sister x 5   Breast cancer Cousin     Social History Social History   Tobacco Use   Smoking status: Never   Smokeless tobacco: Never  Vaping Use   Vaping status: Never Used  Substance Use Topics   Alcohol use: No   Drug use: No     Allergies   Sulfa antibiotics, Aspirin-acetaminophen-caffeine, Oxycodone-acetaminophen, Codeine, Cymbalta [duloxetine hcl], Ibuprofen, Keflex [cephalexin], Macrodantin [nitrofurantoin macrocrystal], Mucinex [guaifenesin er], and Tape   Review of Systems Review of Systems Per HPI  Physical Exam Triage Vital Signs ED Triage Vitals  Encounter Vitals Group     BP 10/20/23 1334 137/83     Systolic BP Percentile --      Diastolic BP Percentile --      Pulse Rate 10/20/23 1334 62     Resp 10/20/23 1334 20     Temp 10/20/23 1334 98.5 F (36.9 C)     Temp Source 10/20/23 1334 Oral     SpO2 10/20/23 1334 93 %     Weight --      Height --      Head Circumference --      Peak Flow --      Pain Score 10/20/23 1327 0     Pain Loc --      Pain Education --      Exclude from Growth Chart --    No data found.  Updated Vital Signs  BP 137/83 (BP Location: Right Arm)   Pulse 62   Temp 98.5 F (36.9 C) (Oral)   Resp 20   SpO2 93%   Visual Acuity Right Eye Distance:   Left Eye Distance:   Bilateral Distance:    Right Eye Near:   Left Eye Near:    Bilateral Near:     Physical Exam Vitals and nursing note reviewed.  Constitutional:      General: She is not in acute distress.    Appearance: Normal appearance.  HENT:     Head: Normocephalic.     Right Ear: Tympanic membrane, ear canal and external ear normal.     Left Ear: Tympanic membrane, ear canal and external ear normal.     Nose: Congestion present.     Mouth/Throat:     Mouth: Mucous  membranes are moist.  Eyes:     Extraocular Movements: Extraocular movements intact.     Conjunctiva/sclera: Conjunctivae normal.     Pupils: Pupils are equal, round, and reactive to light.  Cardiovascular:     Rate and Rhythm: Normal rate and regular rhythm.     Pulses: Normal pulses.     Heart sounds: Normal heart sounds.  Pulmonary:     Effort: Pulmonary effort is normal.     Breath sounds: Wheezing (Posterior bilateral lower lobes, wheezing improved with coughing) and rhonchi (Posterior bilateral lower lobes) present.  Abdominal:     General: Bowel sounds are normal.     Palpations: Abdomen is soft.  Musculoskeletal:     Cervical back: Normal range of motion.  Lymphadenopathy:     Cervical: No cervical adenopathy.  Skin:    General: Skin is warm and dry.  Neurological:     General: No focal deficit present.     Mental Status: She is alert and oriented to person, place, and time.  Psychiatric:        Mood and Affect: Mood normal.        Behavior: Behavior normal.      UC Treatments / Results  Labs (all labs ordered are listed, but only abnormal results are displayed) Labs Reviewed - No data to display  EKG   Radiology DG Chest 2 View  Result Date: 10/20/2023 CLINICAL DATA:  Shortness of breath and cough for 3 weeks EXAM: CHEST - 2 VIEW COMPARISON:  08/29/2023 FINDINGS: The heart size and mediastinal contours are within normal limits. Diffuse bilateral interstitial pulmonary opacity. Disc degenerative disease of the thoracic spine. IMPRESSION: Diffuse bilateral interstitial pulmonary opacity, consistent with edema or atypical/viral infection. No focal airspace opacity. Electronically Signed   By: Jearld Lesch M.D.   On: 10/20/2023 16:17    Procedures Procedures (including critical care time)  Medications Ordered in UC Medications  methylPREDNISolone sodium succinate (SOLU-MEDROL) 125 mg/2 mL injection 80 mg (80 mg Intramuscular Given 10/20/23 1514)  albuterol  (PROVENTIL) (2.5 MG/3ML) 0.083% nebulizer solution 2.5 mg (2.5 mg Nebulization Given 10/20/23 1518)    Initial Impression / Assessment and Plan / UC Course  I have reviewed the triage vital signs and the nursing notes.  Pertinent labs & imaging results that were available during my care of the patient were reviewed by me and considered in my medical decision making (see chart for details).  Chest x-ray shows bilateral interstitial pulmonary opacity, cannot rule out atypical or viral infection.  Patient with underlying history of COPD.  Patient with improvement of her symptoms after the albuterol nebulizer treatment.  Patient was also administered  Solu-Medrol 80 mg.  Will start patient on Augmentin 875/125 mg tablets for infection, prednisone 40 mg to help with bronchial inflammation, albuterol nebulizer solution to help with wheezing, and Promethazine DM for cough at bedtime.  Supportive care recommendations were provided and discussed with the patient to include over-the-counter Tylenol for pain, use of a humidifier in the bedroom at nighttime during sleep and sleeping elevated while symptoms persist.  Patient was advised to follow-up with her primary care physician if symptoms fail to improve.  Patient is in agreement with this plan of care and verbalizes understanding.  All questions were answered.  Patient stable for discharge.   Final Clinical Impressions(s) / UC Diagnoses   Final diagnoses:  COPD exacerbation (HCC)  Lower respiratory infection     Discharge Instructions      Your chest x-ray is pending.  You will be contacted once the chest x-ray results are received. Take medication as prescribed. Increase fluids and allow for plenty of rest. You may use your albuterol inhaler as needed for cough. Recommend using a humidifier in your bedroom at nighttime during sleep and sleeping elevated on pillows while symptoms persist. If symptoms do not improve with this treatment, please  follow-up with your primary care physician for further evaluation. Follow-up as needed.     ED Prescriptions     Medication Sig Dispense Auth. Provider   doxycycline (VIBRA-TABS) 100 MG tablet Take 1 tablet (100 mg total) by mouth 2 (two) times daily for 7 days. 14 tablet Leath-Warren, Sadie Haber, NP   predniSONE (DELTASONE) 20 MG tablet Take 2 tablets (40 mg total) by mouth daily with breakfast for 5 days. 10 tablet Leath-Warren, Sadie Haber, NP   albuterol (PROVENTIL) (2.5 MG/3ML) 0.083% nebulizer solution Take 3 mLs (2.5 mg total) by nebulization every 6 (six) hours as needed for wheezing or shortness of breath. 75 mL Leath-Warren, Sadie Haber, NP   promethazine-dextromethorphan (PROMETHAZINE-DM) 6.25-15 MG/5ML syrup Take 5 mLs by mouth at bedtime as needed. 118 mL Leath-Warren, Sadie Haber, NP      PDMP not reviewed this encounter.   Abran Cantor, NP 10/20/23 1636    Abran Cantor, NP 10/20/23 1648

## 2023-10-20 NOTE — Discharge Instructions (Addendum)
Your chest x-ray is pending.  You will be contacted once the chest x-ray results are received. Take medication as prescribed. Increase fluids and allow for plenty of rest. You may use your albuterol inhaler as needed for cough. Recommend using a humidifier in your bedroom at nighttime during sleep and sleeping elevated on pillows while symptoms persist. If symptoms do not improve with this treatment, please follow-up with your primary care physician for further evaluation. Follow-up as needed.

## 2023-10-20 NOTE — ED Triage Notes (Signed)
Patient c/o productive cough x3 weeks (was dx with bronchitis).   Home interventions: nyquil, BC goody powder , alka seltzer

## 2023-10-24 DIAGNOSIS — M961 Postlaminectomy syndrome, not elsewhere classified: Secondary | ICD-10-CM | POA: Diagnosis not present

## 2024-01-02 DIAGNOSIS — E538 Deficiency of other specified B group vitamins: Secondary | ICD-10-CM | POA: Diagnosis not present

## 2024-01-02 DIAGNOSIS — E1121 Type 2 diabetes mellitus with diabetic nephropathy: Secondary | ICD-10-CM | POA: Diagnosis not present

## 2024-01-02 DIAGNOSIS — E559 Vitamin D deficiency, unspecified: Secondary | ICD-10-CM | POA: Diagnosis not present

## 2024-01-02 DIAGNOSIS — D509 Iron deficiency anemia, unspecified: Secondary | ICD-10-CM | POA: Diagnosis not present

## 2024-01-02 DIAGNOSIS — I10 Essential (primary) hypertension: Secondary | ICD-10-CM | POA: Diagnosis not present

## 2024-01-15 DIAGNOSIS — G43909 Migraine, unspecified, not intractable, without status migrainosus: Secondary | ICD-10-CM | POA: Diagnosis not present

## 2024-01-15 DIAGNOSIS — E1121 Type 2 diabetes mellitus with diabetic nephropathy: Secondary | ICD-10-CM | POA: Diagnosis not present

## 2024-01-15 DIAGNOSIS — I129 Hypertensive chronic kidney disease with stage 1 through stage 4 chronic kidney disease, or unspecified chronic kidney disease: Secondary | ICD-10-CM | POA: Diagnosis not present

## 2024-01-15 DIAGNOSIS — J449 Chronic obstructive pulmonary disease, unspecified: Secondary | ICD-10-CM | POA: Diagnosis not present

## 2024-01-15 DIAGNOSIS — E559 Vitamin D deficiency, unspecified: Secondary | ICD-10-CM | POA: Diagnosis not present

## 2024-01-15 DIAGNOSIS — N189 Chronic kidney disease, unspecified: Secondary | ICD-10-CM | POA: Diagnosis not present

## 2024-01-15 DIAGNOSIS — M810 Age-related osteoporosis without current pathological fracture: Secondary | ICD-10-CM | POA: Diagnosis not present

## 2024-01-15 DIAGNOSIS — R809 Proteinuria, unspecified: Secondary | ICD-10-CM | POA: Diagnosis not present

## 2024-01-15 DIAGNOSIS — E538 Deficiency of other specified B group vitamins: Secondary | ICD-10-CM | POA: Diagnosis not present

## 2024-01-15 DIAGNOSIS — E1122 Type 2 diabetes mellitus with diabetic chronic kidney disease: Secondary | ICD-10-CM | POA: Diagnosis not present

## 2024-01-15 DIAGNOSIS — E785 Hyperlipidemia, unspecified: Secondary | ICD-10-CM | POA: Diagnosis not present

## 2024-01-15 DIAGNOSIS — G720 Drug-induced myopathy: Secondary | ICD-10-CM | POA: Diagnosis not present

## 2024-01-18 DIAGNOSIS — E538 Deficiency of other specified B group vitamins: Secondary | ICD-10-CM | POA: Diagnosis not present

## 2024-02-24 ENCOUNTER — Emergency Department (HOSPITAL_COMMUNITY)

## 2024-02-24 ENCOUNTER — Encounter (HOSPITAL_COMMUNITY): Payer: Self-pay | Admitting: Emergency Medicine

## 2024-02-24 ENCOUNTER — Other Ambulatory Visit: Payer: Self-pay

## 2024-02-24 ENCOUNTER — Emergency Department (HOSPITAL_COMMUNITY)
Admission: EM | Admit: 2024-02-24 | Discharge: 2024-02-24 | Disposition: A | Discharge status: Home / Self Care | Attending: Emergency Medicine | Admitting: Emergency Medicine

## 2024-02-24 DIAGNOSIS — R109 Unspecified abdominal pain: Secondary | ICD-10-CM | POA: Diagnosis not present

## 2024-02-24 DIAGNOSIS — E876 Hypokalemia: Secondary | ICD-10-CM | POA: Diagnosis not present

## 2024-02-24 DIAGNOSIS — E119 Type 2 diabetes mellitus without complications: Secondary | ICD-10-CM | POA: Diagnosis not present

## 2024-02-24 DIAGNOSIS — I10 Essential (primary) hypertension: Secondary | ICD-10-CM | POA: Diagnosis not present

## 2024-02-24 DIAGNOSIS — K219 Gastro-esophageal reflux disease without esophagitis: Secondary | ICD-10-CM | POA: Insufficient documentation

## 2024-02-24 DIAGNOSIS — J449 Chronic obstructive pulmonary disease, unspecified: Secondary | ICD-10-CM | POA: Diagnosis not present

## 2024-02-24 DIAGNOSIS — I2510 Atherosclerotic heart disease of native coronary artery without angina pectoris: Secondary | ICD-10-CM | POA: Diagnosis not present

## 2024-02-24 DIAGNOSIS — I700 Atherosclerosis of aorta: Secondary | ICD-10-CM | POA: Insufficient documentation

## 2024-02-24 DIAGNOSIS — Z79899 Other long term (current) drug therapy: Secondary | ICD-10-CM | POA: Insufficient documentation

## 2024-02-24 DIAGNOSIS — N329 Bladder disorder, unspecified: Secondary | ICD-10-CM | POA: Diagnosis not present

## 2024-02-24 DIAGNOSIS — R197 Diarrhea, unspecified: Secondary | ICD-10-CM | POA: Insufficient documentation

## 2024-02-24 LAB — CBC
HCT: 40 % (ref 36.0–46.0)
Hemoglobin: 13 g/dL (ref 12.0–15.0)
MCH: 30.2 pg (ref 26.0–34.0)
MCHC: 32.5 g/dL (ref 30.0–36.0)
MCV: 93 fL (ref 80.0–100.0)
Platelets: 168 10*3/uL (ref 150–400)
RBC: 4.3 MIL/uL (ref 3.87–5.11)
RDW: 14.5 % (ref 11.5–15.5)
WBC: 6.4 10*3/uL (ref 4.0–10.5)
nRBC: 0 % (ref 0.0–0.2)

## 2024-02-24 LAB — COMPREHENSIVE METABOLIC PANEL
ALT: 17 U/L (ref 0–44)
AST: 31 U/L (ref 15–41)
Albumin: 3.1 g/dL — ABNORMAL LOW (ref 3.5–5.0)
Alkaline Phosphatase: 44 U/L (ref 38–126)
Anion gap: 8 (ref 5–15)
BUN: 10 mg/dL (ref 8–23)
CO2: 29 mmol/L (ref 22–32)
Calcium: 8.3 mg/dL — ABNORMAL LOW (ref 8.9–10.3)
Chloride: 101 mmol/L (ref 98–111)
Creatinine, Ser: 0.78 mg/dL (ref 0.44–1.00)
GFR, Estimated: >60 mL/min (ref >60)
Glucose, Bld: 113 mg/dL — ABNORMAL HIGH (ref 70–99)
Potassium: 2.9 mmol/L — ABNORMAL LOW (ref 3.5–5.1)
Sodium: 138 mmol/L (ref 135–145)
Total Bilirubin: 0.5 mg/dL (ref 0.0–1.2)
Total Protein: 6.1 g/dL — ABNORMAL LOW (ref 6.5–8.1)

## 2024-02-24 LAB — POC OCCULT BLOOD, ED: Fecal Occult Bld: NEGATIVE

## 2024-02-24 LAB — C DIFFICILE QUICK SCREEN W PCR REFLEX
C Diff antigen: NEGATIVE
C Diff interpretation: NOT DETECTED
C Diff toxin: NEGATIVE

## 2024-02-24 MED ORDER — POTASSIUM CHLORIDE 10 MEQ/100ML IV SOLN
10.0000 meq | Freq: Once | INTRAVENOUS | Status: AC
Start: 2024-02-24 — End: 2024-02-24
  Filled 2024-02-24: qty 100

## 2024-02-24 MED ORDER — IOHEXOL 300 MG/ML  SOLN
100.0000 mL | Freq: Once | INTRAMUSCULAR | Status: AC | PRN
  Administered 2024-02-24: 100 mL via INTRAVENOUS

## 2024-02-24 MED ORDER — POTASSIUM CHLORIDE 10 MEQ/100ML IV SOLN
10.0000 meq | Freq: Once | INTRAVENOUS | Status: AC
Start: 2024-02-24 — End: 2024-02-24
  Administered 2024-02-24: 10 meq via INTRAVENOUS

## 2024-02-24 MED ORDER — ONDANSETRON HCL 4 MG PO TABS: 4.0000 mg | ORAL_TABLET | Freq: Four times a day (QID) | ORAL | 0 refills | Status: DC

## 2024-02-24 MED ORDER — POTASSIUM CHLORIDE 10 MEQ/100ML IV SOLN
INTRAVENOUS | Status: AC
  Administered 2024-02-24: 10 meq via INTRAVENOUS
  Filled 2024-02-24: qty 100

## 2024-02-24 MED ORDER — SODIUM CHLORIDE 0.9 % IV BOLUS
500.0000 mL | Freq: Once | INTRAVENOUS | Status: AC
  Administered 2024-02-24: 500 mL via INTRAVENOUS

## 2024-02-24 NOTE — ED Provider Notes (Signed)
 [place] EMERGENCY DEPARTMENT AT [facility] Provider Note   CSN: [id number] Arrival date & time: 02/24/24  1338     History {Add pertinent medical, surgical, social history, OB history to HPI:1} Chief Complaint  Patient presents with   Diarrhea    Alexis Rios is a 78 y.o. female.  Patient complains of diarrhea for 3 weeks and some vomiting for the last few days.  No vomiting today.  She states that she may have been having some blood in her diarrhea.  Patient has given stool specimens to her primary care doctor patient has a history of COPD GERD anemia coronary artery disease diabetes and hypertension  The history is provided by the patient and medical records. No language interpreter was used.  Diarrhea Quality:  Bloody Severity:  Moderate Onset quality:  Gradual Timing:  Constant Progression:  Unchanged Relieved by:  None tried Worsened by:  Nothing Ineffective treatments:  None tried Associated symptoms: abdominal pain   Associated symptoms: no headaches        Home Medications Prior to Admission medications   Medication Sig Start Date End Date Taking? Authorizing Provider  albuterol (PROVENTIL) (2.5 MG/3ML) 0.083% nebulizer solution Take 3 mLs (2.5 mg total) by nebulization every 6 (six) hours as needed for wheezing or shortness of breath. 10/20/23   Leath-Warren, Sadie Haber, NP  ALLERGY RELIEF 10 MG tablet Take 10 mg by mouth daily. 05/24/21   [provider]  ALPRAZolam Prudy Feeler) 0.25 MG tablet Take 0.25 mg by mouth 2 (two) times daily.    [provider]  budesonide-formoterol (SYMBICORT) 80-4.5 MCG/ACT inhaler Inhale 2 puffs into the lungs at bedtime.     [provider]  calcium carbonate (OS-CAL - DOSED IN MG OF ELEMENTAL CALCIUM) 1250 (500 Ca) MG tablet calcium carbonate 500 mg calcium (1,250 mg) tablet  TAKE ONE TABLET BY MOUTH ONCE DAILY FOR hypocalcemia    [provider]  citalopram (CELEXA) 40 MG tablet Take  40 mg by mouth at bedtime.     [provider]  diclofenac Sodium (VOLTAREN) 1 % GEL Apply 2-4 g topically as needed.    [provider]  diphenhydrAMINE (BENADRYL) 25 mg capsule Take 1 capsule (25 mg total) by mouth every 6 (six) hours as needed for itching. 10/25/18   Swinteck, Arlys John, MD  gabapentin (NEURONTIN) 400 MG capsule Take 400 mg by mouth at bedtime.    [provider]  HYDROcodone-acetaminophen (NORCO/VICODIN) 5-325 MG tablet Take 1 tablet by mouth 3 (three) times daily.    [provider]  lisinopril (ZESTRIL) 5 MG tablet Take 5 mg by mouth daily. 12/27/20   [provider]  ondansetron (ZOFRAN) 4 MG tablet Take 1 tablet (4 mg total) by mouth every 6 (six) hours as needed for nausea. 10/11/18   Swinteck, Arlys John, MD  pantoprazole (PROTONIX) 40 MG tablet Take 1 tablet (40 mg total) by mouth 2 (two) times daily. Take 30 minutes before breakfast. 06/21/21 09/19/21  Arnaldo Natal, NP  promethazine-dextromethorphan (PROMETHAZINE-DM) 6.25-15 MG/5ML syrup Take 5 mLs by mouth at bedtime as needed. 10/20/23   Leath-Warren, Sadie Haber, NP  risedronate (ACTONEL) 150 MG tablet Take 150 mg by mouth every 30 (thirty) days. with water on empty stomach, nothing by mouth or lie down for next 30 minutes.    [provider]  rosuvastatin (CRESTOR) 5 MG tablet Take 5 mg by mouth every Sunday.  08/27/18   [provider]  topiramate (TOPAMAX) 100 MG tablet  Take 150 mg by mouth at bedtime.    [provider]  triamcinolone cream (KENALOG) 0.1 % Apply 1 application topically 2 (two) times daily as needed. 05/24/21   [provider]  Vitamin D, Ergocalciferol, (DRISDOL) 50000 units CAPS capsule Take 50,000 Units by mouth every Saturday.     [provider]      Allergies    Sulfa antibiotics, Aspirin-acetaminophen-caffeine, Oxycodone-acetaminophen, Codeine, Cymbalta [duloxetine hcl], Ibuprofen, Keflex [cephalexin],  Macrodantin [nitrofurantoin macrocrystal], Mucinex [guaifenesin er], and Tape    Review of Systems   Review of Systems  Constitutional:  Negative for appetite change and fatigue.  HENT:  Negative for congestion, ear discharge and sinus pressure.   Eyes:  Negative for discharge.  Respiratory:  Negative for cough.   Cardiovascular:  Negative for chest pain.  Gastrointestinal:  Positive for abdominal pain and diarrhea.  Genitourinary:  Negative for frequency and hematuria.  Musculoskeletal:  Negative for back pain.  Skin:  Negative for rash.  Neurological:  Negative for seizures and headaches.  Psychiatric/Behavioral:  Negative for hallucinations.     Physical Exam Updated Vital Signs BP 132/87 (BP Location: Left Arm)   Pulse 84   Temp 98.1 F (36.7 C) (Oral)   Resp 16   Ht 4\' 11"  (1.499 m)   Wt 96 kg   SpO2 94%   BMI 42.75 kg/m  Physical Exam Vitals and nursing note reviewed.  Constitutional:      Appearance: She is well-developed.  HENT:     Head: Normocephalic.  Eyes:     General: No scleral icterus.    Conjunctiva/sclera: Conjunctivae normal.  Neck:     Thyroid: No thyromegaly.  Cardiovascular:     Rate and Rhythm: Normal rate and regular rhythm.     Heart sounds: No murmur heard.    No friction rub. No gallop.  Pulmonary:     Breath sounds: No stridor. No wheezing or rales.  Chest:     Chest wall: No tenderness.  Abdominal:     General: There is no distension.     Tenderness: There is abdominal tenderness. There is no rebound.     Comments: Minimal tenderness throughout  Musculoskeletal:        General: Normal range of motion.     Cervical back: Neck supple.  Lymphadenopathy:     Cervical: No cervical adenopathy.  Skin:    Findings: No erythema or rash.  Neurological:     Mental Status: She is alert and oriented to person, place, and time.     Motor: No abnormal muscle tone.     Coordination: Coordination normal.  Psychiatric:        Behavior:  Behavior normal.     ED Results / Procedures / Treatments   Labs (all labs ordered are listed, but only abnormal results are displayed) Labs Reviewed  COMPREHENSIVE METABOLIC PANEL - Abnormal; Notable for the following components:      Result Value   Potassium 2.9 (*)    Glucose, Bld 113 (*)    Calcium 8.3 (*)    Total Protein 6.1 (*)    Albumin 3.1 (*)    All other components within normal limits  C DIFFICILE QUICK SCREEN W PCR REFLEX    GASTROINTESTINAL PANEL BY PCR, STOOL (REPLACES STOOL CULTURE)  CBC  POC OCCULT BLOOD, ED    EKG None  Radiology No results found.  Procedures Procedures  {Document cardiac monitor, telemetry assessment procedure when appropriate:1}  Medications Ordered in ED  Medications  potassium chloride 10 mEq in 100 mL IVPB (has no administration in time range)  potassium chloride 10 mEq in 100 mL IVPB (has no administration in time range)  sodium chloride 0.9 % bolus 500 mL (500 mLs Intravenous New Bag/Given 02/24/24 1453)  iohexol (OMNIPAQUE) 300 MG/ML solution 100 mL (100 mLs Intravenous Contrast Given 02/24/24 1519)    ED Course/ Medical Decision Making/ A&P  Patient with persistent diarrhea for weeks.  Labs show some hypokalemia, CBC unremarkable, stool studies ordered {   Click here for ABCD2, HEART and other calculatorsREFRESH Note before signing :1}                              Medical Decision Making Amount and/or Complexity of Data Reviewed Labs: ordered. Radiology: ordered.  Risk Prescription drug management.   Patient with persistent diarrhea.  CT of abdomen pending.  {Document critical care time when appropriate:1} {Document review of labs and clinical decision tools ie heart score, Chads2Vasc2 etc:1}  {Document your independent review of radiology images, and any outside records:1} {Document your discussion with family members, caretakers, and with consultants:1} {Document social determinants of health affecting pt's  care:1} {Document your decision making why or why not admission, treatments were needed:1} Final Clinical Impression(s) / ED Diagnoses Final diagnoses:  None    Rx / DC Orders ED Discharge Orders     None

## 2024-02-24 NOTE — ED Provider Notes (Signed)
?   Diarrhea for 3 wees, vomiting as well, no vomiting today. Labs with low K, CT pending.  K given PCP told her to come in for fluids and eval.  I have examined the patient, she has normal vital signs, she has not had any diarrhea since arrival, she will be given a cup for collecting a specimen and can bring it to her doctor's office.  She will be given Zofran for home, encouraged to take Imodium, stable for discharge CT scan without any acute findings   Eber Hong, MD 02/24/24 1920

## 2024-02-24 NOTE — ED Triage Notes (Signed)
 Pt bib pov w/ c/o bloody diarrhea x 3 weeks. Pt was sent here today by Dr. Scharlene Gloss office. Pt reports abdominal pain. Pt reports vomiting x 2 weeks. Pt reports dizziness

## 2024-02-24 NOTE — Discharge Instructions (Addendum)
 You can pick up some Imodium from the pharmacy to help with diarrhea, Zofran every 6 hours as needed for nausea, have your family doctor recheck your potassium in 1 week.  We have given you a sample cup, please collect a stool sample and bring it to your doctor's office for testing

## 2024-02-26 LAB — GASTROINTESTINAL PANEL BY PCR, STOOL (REPLACES STOOL CULTURE)

## 2024-03-22 NOTE — Progress Notes (Signed)
 Marland Kitchen

## 2024-04-03 DIAGNOSIS — M79674 Pain in right toe(s): Secondary | ICD-10-CM | POA: Diagnosis not present

## 2024-04-03 DIAGNOSIS — B351 Tinea unguium: Secondary | ICD-10-CM | POA: Diagnosis not present

## 2024-04-03 DIAGNOSIS — L84 Corns and callosities: Secondary | ICD-10-CM | POA: Diagnosis not present

## 2024-04-03 DIAGNOSIS — M79675 Pain in left toe(s): Secondary | ICD-10-CM | POA: Diagnosis not present

## 2024-04-03 DIAGNOSIS — E1142 Type 2 diabetes mellitus with diabetic polyneuropathy: Secondary | ICD-10-CM | POA: Diagnosis not present

## 2024-04-14 DIAGNOSIS — E1121 Type 2 diabetes mellitus with diabetic nephropathy: Secondary | ICD-10-CM | POA: Diagnosis not present

## 2024-04-14 DIAGNOSIS — E785 Hyperlipidemia, unspecified: Secondary | ICD-10-CM | POA: Diagnosis not present

## 2024-05-06 DIAGNOSIS — M810 Age-related osteoporosis without current pathological fracture: Secondary | ICD-10-CM | POA: Diagnosis not present

## 2024-05-06 DIAGNOSIS — I10 Essential (primary) hypertension: Secondary | ICD-10-CM | POA: Diagnosis not present

## 2024-05-06 DIAGNOSIS — E785 Hyperlipidemia, unspecified: Secondary | ICD-10-CM | POA: Diagnosis not present

## 2024-05-06 DIAGNOSIS — J449 Chronic obstructive pulmonary disease, unspecified: Secondary | ICD-10-CM | POA: Diagnosis not present

## 2024-05-09 DIAGNOSIS — K219 Gastro-esophageal reflux disease without esophagitis: Secondary | ICD-10-CM | POA: Diagnosis not present

## 2024-05-09 DIAGNOSIS — G629 Polyneuropathy, unspecified: Secondary | ICD-10-CM | POA: Diagnosis not present

## 2024-05-09 DIAGNOSIS — R053 Chronic cough: Secondary | ICD-10-CM | POA: Diagnosis not present

## 2024-05-09 DIAGNOSIS — M810 Age-related osteoporosis without current pathological fracture: Secondary | ICD-10-CM | POA: Diagnosis not present

## 2024-05-09 DIAGNOSIS — J302 Other seasonal allergic rhinitis: Secondary | ICD-10-CM | POA: Diagnosis not present

## 2024-05-09 DIAGNOSIS — I10 Essential (primary) hypertension: Secondary | ICD-10-CM | POA: Diagnosis not present

## 2024-05-09 DIAGNOSIS — D509 Iron deficiency anemia, unspecified: Secondary | ICD-10-CM | POA: Diagnosis not present

## 2024-05-09 DIAGNOSIS — E785 Hyperlipidemia, unspecified: Secondary | ICD-10-CM | POA: Diagnosis not present

## 2024-05-09 DIAGNOSIS — J449 Chronic obstructive pulmonary disease, unspecified: Secondary | ICD-10-CM | POA: Diagnosis not present

## 2024-05-09 DIAGNOSIS — E538 Deficiency of other specified B group vitamins: Secondary | ICD-10-CM | POA: Diagnosis not present

## 2024-05-12 DIAGNOSIS — I10 Essential (primary) hypertension: Secondary | ICD-10-CM | POA: Diagnosis not present

## 2024-05-12 DIAGNOSIS — J449 Chronic obstructive pulmonary disease, unspecified: Secondary | ICD-10-CM | POA: Diagnosis not present

## 2024-05-12 DIAGNOSIS — E785 Hyperlipidemia, unspecified: Secondary | ICD-10-CM | POA: Diagnosis not present

## 2024-05-14 DIAGNOSIS — L304 Erythema intertrigo: Secondary | ICD-10-CM | POA: Diagnosis not present

## 2024-05-14 DIAGNOSIS — Z1283 Encounter for screening for malignant neoplasm of skin: Secondary | ICD-10-CM | POA: Diagnosis not present

## 2024-05-14 DIAGNOSIS — L570 Actinic keratosis: Secondary | ICD-10-CM | POA: Diagnosis not present

## 2024-05-23 DIAGNOSIS — E538 Deficiency of other specified B group vitamins: Secondary | ICD-10-CM | POA: Diagnosis not present

## 2024-05-23 DIAGNOSIS — D509 Iron deficiency anemia, unspecified: Secondary | ICD-10-CM | POA: Diagnosis not present

## 2024-05-23 DIAGNOSIS — I10 Essential (primary) hypertension: Secondary | ICD-10-CM | POA: Diagnosis not present

## 2024-05-23 DIAGNOSIS — E785 Hyperlipidemia, unspecified: Secondary | ICD-10-CM | POA: Diagnosis not present

## 2024-05-23 DIAGNOSIS — J449 Chronic obstructive pulmonary disease, unspecified: Secondary | ICD-10-CM | POA: Diagnosis not present

## 2024-05-23 DIAGNOSIS — M810 Age-related osteoporosis without current pathological fracture: Secondary | ICD-10-CM | POA: Diagnosis not present

## 2024-05-23 DIAGNOSIS — R053 Chronic cough: Secondary | ICD-10-CM | POA: Diagnosis not present

## 2024-05-23 DIAGNOSIS — K219 Gastro-esophageal reflux disease without esophagitis: Secondary | ICD-10-CM | POA: Diagnosis not present

## 2024-05-23 DIAGNOSIS — G629 Polyneuropathy, unspecified: Secondary | ICD-10-CM | POA: Diagnosis not present

## 2024-05-23 DIAGNOSIS — J302 Other seasonal allergic rhinitis: Secondary | ICD-10-CM | POA: Diagnosis not present

## 2024-05-28 ENCOUNTER — Encounter: Payer: Self-pay | Admitting: Gastroenterology

## 2024-06-09 NOTE — Progress Notes (Unsigned)
 Referring Provider: Corlis Longs, FNP  Primary Care Physician:  Shona Norleen PEDLAR, MD Primary Gastroenterologist:  Dr. Cindie  Chief Complaint  Patient presents with   Abdominal Pain    Stomach hurts and diarrhea     HPI:   Alexis Rios is a 78 y.o. female presenting today at the request of Corlis Longs, FNP for GERD.  Today: Patient reports diarrhea for at least 3-4 months. Has tried imodium, pepto bismol with no improvement. Stools are very loose. Going 6-7 times a day. Usually passes a lot of stool.  Some nocturnal stools. Sometimes yellow. Sometimes black. No bright red blood. No abdominal pain. Some nausea and vomiting. Not daily. Can't remember exactly when it started, but sometime in he last couple months or so. Occurs late in the day. No specific triggers. Has acid reflux. Uses alka seltzer prn. Takes pantoprazole  40 mg once a day, in the morning before breakfast. Continues with daily reflux. No dysphagia. Zofran  is helping her nausea.   Before 3-4 months ago, was having normal BMs daily. Usually just 1 BM per day.   No prior antibiotics, medication changes before diarrhea onset.  Has been losing weight.   Wt Readings from Last 3 Encounters:  06/11/24 172 lb 6.4 oz (78.2 kg)  02/24/24 211 lb 10.3 oz (96 kg)  06/21/21 213 lb 6 oz (96.8 kg)     Colonoscopy November 2013. 1 polyp removed. Path with tubular adenoma.  EGD 10/2012. Small HH and gastropathy. Path with focal intestinal metaplasia. No H pylori.   Reviewed ER eval 02/24/24: Fecal occult blood negative. C. difficile GDH and toxin, GI pathogen panel negative.  CBC wnl. CMP with K 2.9.  CT A/P with contrast 02/24/2024 no acute abnormalities.  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  Patient states she was never told about the CT results in the ER. Was unaware of the bladder mass. No urinary symptoms.    Past Medical History:  Diagnosis Date   Anemia    Anxiety    Arthritis    Asthma     CAD (coronary artery disease)    Cath 2009, AV groove CX 30%, RCA 20 %   Candidiasis of skin and nail    Carpal tunnel syndrome    Chronic back pain    Chronic headaches    migraines   Complication of anesthesia    if no breathing tx before anesthesia wakes with asthma   COPD (chronic obstructive pulmonary disease) (HCC)    Depression    Diabetes mellitus    DIET CONTROLLED NOW   Diverticulosis    Gallstones    Gastroparesis    ?   GERD (gastroesophageal reflux disease)    H/O echocardiogram    Echo 09/2011: Mild LVH, EF 60-65%, grade 1 diastolic dysfunction, trivial AI, mild MR, mild LAE.   H/O exercise stress test    Dobutamine  Myoview  09/2011: No scar or ischemia, no EKG changes, study not gated.   HH (hiatus hernia)    History of gallstones    History of pneumonia    Hypercholesteremia    Hypertension    IBS (irritable bowel syndrome)    Osteoporosis    Peripheral neuropathy    LEGS AND FEET   Pneumonia LAST TIME 8 YRS AGO   S/P cardiac catheterization    LHC 03/2008: Mid AV groove circumflex 30%, mid RCA 20%, EF 60%   Seasonal allergies    Skin cancer    scc/bcc  Skin yeast infection 07/05/2015   Sleep apnea    cpap    Stones in the urinary tract    Subclinical hypothyroidism    UTI (urinary tract infection)     Past Surgical History:  Procedure Laterality Date   ABDOMINAL HYSTERECTOMY     partial   APPENDECTOMY     ARTHOSCOPIC ROTAOR CUFF REPAIR Right    X 2   BACK SURGERY     X 4   BACK SURGERY     BREAST SURGERY     lump x 2 R   CHOLECYSTECTOMY     EYE SURGERY  2018   IOC FOR CATARACTS BOTH EYES,    EYE SURGERY  2019   YAG LASER FOR CATARACT FILM   FOOT SURGERY     R   KNEE ARTHROPLASTY Right 10/10/2018   Procedure: RIGHT TOTAL KNEE ARTHROPLASTY WITH COMPUTER NAVIGATION;  Surgeon: Fidel Rogue, MD;  Location: [facility];  Service: Orthopedics;  Laterality: Right;  Needs RNFA   KNEE SURGERY     R   NOSE SURGERY     skin cancer excision    ORIF PERIPROSTHETIC FRACTURE Right 10/23/2018   Procedure: OPEN REDUCTION INTERNAL FIXATION (ORIF) RIGHT  PERIPROSTHETIC FRACTURE;  Surgeon: Fidel Rogue, MD;  Location: [facility];  Service: Orthopedics;  Laterality: Right;   RIGHT WRIST SURGERY WITH PLATE  98/96/7980   shoulder surg     x2 R   TOE AMPUTATION     rightX 1 in past LEFT X 1 done sept 2019 at [facility]   TONSILLECTOMY     UMBILICAL HERNIA REPAIR     WRIST SURGERY     L    Current Outpatient Medications  Medication Sig Dispense Refill   albuterol  (PROVENTIL ) (2.5 MG/3ML) 0.083% nebulizer solution Take 3 mLs (2.5 mg total) by nebulization every 6 (six) hours as needed for wheezing or shortness of breath. 75 mL 12   ALPRAZolam  (NIRAVAM ) 0.25 MG dissolvable tablet Take 0.25 mg by mouth at bedtime as needed for anxiety.     benzonatate (TESSALON) 100 MG capsule 3 (three) times daily as needed for cough.     diclofenac Sodium (VOLTAREN) 1 % GEL Apply 2-4 g topically as needed.     diphenhydrAMINE  (BENADRYL ) 25 mg capsule Take 1 capsule (25 mg total) by mouth every 6 (six) hours as needed for itching. 30 capsule 0   gabapentin  (NEURONTIN ) 400 MG capsule Take 400 mg by mouth at bedtime.     HYDROcodone -acetaminophen  (NORCO/VICODIN) 5-325 MG tablet Take 1 tablet by mouth 3 (three) times daily. (Patient taking differently: Take 1 tablet by mouth every 6 (six) hours as needed for severe pain (pain score 7-10).)     losartan (COZAAR) 50 MG tablet Take 50 mg by mouth daily.     ondansetron  (ZOFRAN ) 4 MG tablet Take 1 tablet (4 mg total) by mouth every 6 (six) hours. 12 tablet 0   pantoprazole  (PROTONIX ) 40 MG tablet Take 1 tablet (40 mg total) by mouth 2 (two) times daily. Take 30 minutes before breakfast. (Patient taking differently: Take 40 mg by mouth daily. Take 30 minutes before breakfast.) 60 tablet 2   triamcinolone  cream (KENALOG ) 0.1 % Apply 1 application topically 2 (two) times daily as needed.     Vitamin D , Ergocalciferol , (DRISDOL )  50000 units CAPS capsule Take 50,000 Units by mouth every Saturday.      vortioxetine HBr (TRINTELLIX) 10 MG TABS tablet Take 1 tablet every day by oral route.  ALLERGY RELIEF 10 MG tablet Take 10 mg by mouth daily.     ALPRAZolam  (XANAX ) 0.25 MG tablet Take 0.25 mg by mouth 2 (two) times daily.     No current facility-administered medications for this visit.    Allergies as of 06/11/2024 - Review Complete 06/11/2024  Allergen Reaction Noted   Sulfa antibiotics Swelling 07/05/2011   Aspirin -acetaminophen -caffeine Hives 03/17/2020   Oxycodone -acetaminophen  Itching 03/17/2020   Codeine Itching 07/05/2011   Cymbalta [duloxetine hcl] Other (See Comments) 07/05/2011   Ibuprofen Rash 09/23/2019   Keflex [cephalexin]  06/23/2015   Macrodantin [nitrofurantoin macrocrystal] Nausea And Vomiting 01/31/2013   Mucinex  [guaifenesin  er] Other (See Comments) 06/23/2015   Tape Rash 05/14/2012    Family History  Problem Relation Age of Onset   Heart disease Mother    Hypertension Mother    Stroke Mother    Heart disease Father    Stroke Father    Hypertension Sister    Diabetes Sister    Liver cancer Brother    Hypertension Brother    Diabetes Brother    Leukemia Brother    Emphysema Brother    Allergies Daughter    Diabetes Other        all brothers and sister x 5   Breast cancer Cousin     Social History   Socioeconomic History   Marital status: Married    Spouse name: Not on file   Number of children: 2   Years of education: Not on file   Highest education level: Not on file  Occupational History   Occupation: retired    Associate Professor: RETIRED  Tobacco Use   Smoking status: Never   Smokeless tobacco: Never  Vaping Use   Vaping status: Never Used  Substance and Sexual Activity   Alcohol  use: No   Drug use: No   Sexual activity: Never  Other Topics Concern   Not on file  Social History Narrative   Not on file   Social Drivers of Health   Financial Resource Strain:  Not on file  Food Insecurity: Low Risk  (05/21/2023)   Received from [facility]   Hunger Vital Sign    Within the past 12 months, you worried that your food would run out before you got money to buy more: Never true    Within the past 12 months, the food you bought just didn't last and you didn't have money to get more. : Never true  Transportation Needs: No Transportation Needs (05/21/2023)   Received from [facility]    In the past 12 months, has lack of reliable transportation kept you from medical appointments, meetings, work or from getting things needed for daily living? : No  Physical Activity: Not on file  Stress: Not on file  Social Connections: Not on file  Intimate Partner Violence: Not on file    Review of Systems: Gen: Denies any fever, chills, cold or flulike symptoms, presyncope, syncope. CV: Denies chest pain, heart palpitations. Resp: Denies shortness of breath, cough. GI: See HPI GU : Denies urinary burning, urinary frequency, urinary hesitancy MS: Admits to diffuse joint pain. Derm: Denies rash. Psych: Denies depression, anxiety. Heme: See HPI  Physical Exam: BP 138/83 (BP Location: Left Arm, Patient Position: Sitting, Cuff Size: Normal)   Pulse (!) 108   Temp 97.6 F (36.4 C) (Temporal)   Ht 5' (1.524 m)   Wt 172 lb 6.4 oz (78.2 kg)   BMI 33.67 kg/m  General:  Alert and oriented. Pleasant and cooperative. Well-nourished and well-developed.  Head:  Normocephalic and atraumatic. Eyes:  Without icterus, sclera clear and conjunctiva pink.  Ears:  Normal auditory acuity. Lungs:  Clear to auscultation bilaterally. No wheezes, rales, or rhonchi. No distress.  Heart:  S1, S2 present without murmurs appreciated.  Abdomen:  +BS, soft, and non-distended.  Mild TTP in epigastric area. Firm area palpated in suprapubic region, likely related to underlying bladder mass. No HSM noted. No guarding or rebound. No masses appreciated.  Rectal:  Deferred   Msk:  Symmetrical without gross deformities. Normal posture. Extremities:  Without edema. Neurologic:  Alert and  oriented x4;  grossly normal neurologically. Skin:  Intact without significant lesions or rashes. Psych:  Normal mood and affect.    Assessment:  78 year old female with history of mild CAD, COPD, depression, diet-controlled diabetes, HTN, HLD, sleep apnea, subclinical hypothyroidism, GERD, presenting today for further evaluation of GERD, change in bowel habits.  GERD: Reports history of GERD in the past, but had not been much of a problem until recently.  Started on pantoprazole  40 mg daily with no improvement, continues with daily symptoms.   Nausea/vomiting: New onset in the last few months.  May be related to uncontrolled GERD versus etiology of diarrhea/change in bowel habits.  Change in bowel habits/diarrhea: New onset diarrhea x 3-4 months.  Associated nocturnal stools and black stools at times.  No BRBPR, abdominal pain.  Evaluated in the ER in March 2025 with C. difficile, GI pathogen panel negative.  No anemia.  CT with no acute abnormalities.  Etiology unclear.  In the setting of significant unintentional weight loss over the last several months, I am concerned about possible underlying malignancy.  Additional differentials include microscopic colitis, IBD, thyroid  abnormalities, celiac disease.  Black stools: Intermittent.  Unclear if this is melena or possibly related to dietary intake.  Fecal occult blood negative in March and anemia at that time.  Plan to update CBC.  In light of uncontrolled GERD, nausea, vomiting, weight loss, need to rule out PUD, gastritis, duodenitis, H. pylori, malignancy.  Unintentional weight loss: Documented 39 pound weight loss in the last 3-1/2 months.  This is in the setting of change in bowel habits with diarrhea, new onset nausea vomiting, uncontrolled GERD as discussed above, and bladder mass noted on CT in March 2025.    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  Unfortunately, patient states she was not aware of this, thus cannot receive a referral to urology.    Plan:  CBC, BMP, TSH, IgA, TTG IgA Proceed with upper endoscopy + colonoscopy with propofol  by Dr. Cindie in near future. The risks, benefits, and alternatives have been discussed with the patient in detail. The patient states understanding and desires to proceed.  ASA 3, but will schedule as ASA 2 in Endo room 1 or 2. Increase pantoprazole  to 40 mg twice daily. Imodium as needed for diarrhea.  Recommended 2 Imodium after first diarrhea type stool, then 1 additional Imodium after each additional loose stool up to a total of 4 Imodium per day. Continue Zofran  as needed. Refer to urology ASAP. Follow-up after procedures.   Josette Centers, PA-C [facility] 06/11/2024

## 2024-06-09 NOTE — H&P (View-Only) (Signed)
 Referring Provider: Corlis Longs, FNP  Primary Care Physician:  Shona Norleen PEDLAR, MD Primary Gastroenterologist:  Dr. Cindie  Chief Complaint  Patient presents with   Abdominal Pain    Stomach hurts and diarrhea     HPI:   Alexis Rios is a 78 y.o. female presenting today at the request of Corlis Longs, FNP for GERD.  Today: Patient reports diarrhea for at least 3-4 months. Has tried imodium, pepto bismol with no improvement. Stools are very loose. Going 6-7 times a day. Usually passes a lot of stool.  Some nocturnal stools. Sometimes yellow. Sometimes black. No bright red blood. No abdominal pain. Some nausea and vomiting. Not daily. Can't remember exactly when it started, but sometime in he last couple months or so. Occurs late in the day. No specific triggers. Has acid reflux. Uses alka seltzer prn. Takes pantoprazole  40 mg once a day, in the morning before breakfast. Continues with daily reflux. No dysphagia. Zofran  is helping her nausea.   Before 3-4 months ago, was having normal BMs daily. Usually just 1 BM per day.   No prior antibiotics, medication changes before diarrhea onset.  Has been losing weight.   Wt Readings from Last 3 Encounters:  06/11/24 172 lb 6.4 oz (78.2 kg)  02/24/24 211 lb 10.3 oz (96 kg)  06/21/21 213 lb 6 oz (96.8 kg)     Colonoscopy November 2013. 1 polyp removed. Path with tubular adenoma.  EGD 10/2012. Small HH and gastropathy. Path with focal intestinal metaplasia. No H pylori.   Reviewed ER eval 02/24/24: Fecal occult blood negative. C. difficile GDH and toxin, GI pathogen panel negative.  CBC wnl. CMP with K 2.9.  CT A/P with contrast 02/24/2024 no acute abnormalities.  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  Patient states she was never told about the CT results in the ER. Was unaware of the bladder mass. No urinary symptoms.    Past Medical History:  Diagnosis Date   Anemia    Anxiety    Arthritis    Asthma     CAD (coronary artery disease)    Cath 2009, AV groove CX 30%, RCA 20 %   Candidiasis of skin and nail    Carpal tunnel syndrome    Chronic back pain    Chronic headaches    migraines   Complication of anesthesia    if no breathing tx before anesthesia wakes with asthma   COPD (chronic obstructive pulmonary disease) (HCC)    Depression    Diabetes mellitus    DIET CONTROLLED NOW   Diverticulosis    Gallstones    Gastroparesis    ?   GERD (gastroesophageal reflux disease)    H/O echocardiogram    Echo 09/2011: Mild LVH, EF 60-65%, grade 1 diastolic dysfunction, trivial AI, mild MR, mild LAE.   H/O exercise stress test    Dobutamine  Myoview  09/2011: No scar or ischemia, no EKG changes, study not gated.   HH (hiatus hernia)    History of gallstones    History of pneumonia    Hypercholesteremia    Hypertension    IBS (irritable bowel syndrome)    Osteoporosis    Peripheral neuropathy    LEGS AND FEET   Pneumonia LAST TIME 8 YRS AGO   S/P cardiac catheterization    LHC 03/2008: Mid AV groove circumflex 30%, mid RCA 20%, EF 60%   Seasonal allergies    Skin cancer    scc/bcc  Skin yeast infection 07/05/2015   Sleep apnea    cpap    Stones in the urinary tract    Subclinical hypothyroidism    UTI (urinary tract infection)     Past Surgical History:  Procedure Laterality Date   ABDOMINAL HYSTERECTOMY     partial   APPENDECTOMY     ARTHOSCOPIC ROTAOR CUFF REPAIR Right    X 2   BACK SURGERY     X 4   BACK SURGERY     BREAST SURGERY     lump x 2 R   CHOLECYSTECTOMY     EYE SURGERY  2018   IOC FOR CATARACTS BOTH EYES,    EYE SURGERY  2019   YAG LASER FOR CATARACT FILM   FOOT SURGERY     R   KNEE ARTHROPLASTY Right 10/10/2018   Procedure: RIGHT TOTAL KNEE ARTHROPLASTY WITH COMPUTER NAVIGATION;  Surgeon: Fidel Rogue, MD;  Location: [facility];  Service: Orthopedics;  Laterality: Right;  Needs RNFA   KNEE SURGERY     R   NOSE SURGERY     skin cancer excision    ORIF PERIPROSTHETIC FRACTURE Right 10/23/2018   Procedure: OPEN REDUCTION INTERNAL FIXATION (ORIF) RIGHT  PERIPROSTHETIC FRACTURE;  Surgeon: Fidel Rogue, MD;  Location: [facility];  Service: Orthopedics;  Laterality: Right;   RIGHT WRIST SURGERY WITH PLATE  98/96/7980   shoulder surg     x2 R   TOE AMPUTATION     rightX 1 in past LEFT X 1 done sept 2019 at [facility]   TONSILLECTOMY     UMBILICAL HERNIA REPAIR     WRIST SURGERY     L    Current Outpatient Medications  Medication Sig Dispense Refill   albuterol  (PROVENTIL ) (2.5 MG/3ML) 0.083% nebulizer solution Take 3 mLs (2.5 mg total) by nebulization every 6 (six) hours as needed for wheezing or shortness of breath. 75 mL 12   ALPRAZolam  (NIRAVAM ) 0.25 MG dissolvable tablet Take 0.25 mg by mouth at bedtime as needed for anxiety.     benzonatate (TESSALON) 100 MG capsule 3 (three) times daily as needed for cough.     diclofenac Sodium (VOLTAREN) 1 % GEL Apply 2-4 g topically as needed.     diphenhydrAMINE  (BENADRYL ) 25 mg capsule Take 1 capsule (25 mg total) by mouth every 6 (six) hours as needed for itching. 30 capsule 0   gabapentin  (NEURONTIN ) 400 MG capsule Take 400 mg by mouth at bedtime.     HYDROcodone -acetaminophen  (NORCO/VICODIN) 5-325 MG tablet Take 1 tablet by mouth 3 (three) times daily. (Patient taking differently: Take 1 tablet by mouth every 6 (six) hours as needed for severe pain (pain score 7-10).)     losartan (COZAAR) 50 MG tablet Take 50 mg by mouth daily.     ondansetron  (ZOFRAN ) 4 MG tablet Take 1 tablet (4 mg total) by mouth every 6 (six) hours. 12 tablet 0   pantoprazole  (PROTONIX ) 40 MG tablet Take 1 tablet (40 mg total) by mouth 2 (two) times daily. Take 30 minutes before breakfast. (Patient taking differently: Take 40 mg by mouth daily. Take 30 minutes before breakfast.) 60 tablet 2   triamcinolone  cream (KENALOG ) 0.1 % Apply 1 application topically 2 (two) times daily as needed.     Vitamin D , Ergocalciferol , (DRISDOL )  50000 units CAPS capsule Take 50,000 Units by mouth every Saturday.      vortioxetine HBr (TRINTELLIX) 10 MG TABS tablet Take 1 tablet every day by oral route.  ALLERGY RELIEF 10 MG tablet Take 10 mg by mouth daily.     ALPRAZolam  (XANAX ) 0.25 MG tablet Take 0.25 mg by mouth 2 (two) times daily.     No current facility-administered medications for this visit.    Allergies as of 06/11/2024 - Review Complete 06/11/2024  Allergen Reaction Noted   Sulfa antibiotics Swelling 07/05/2011   Aspirin -acetaminophen -caffeine Hives 03/17/2020   Oxycodone -acetaminophen  Itching 03/17/2020   Codeine Itching 07/05/2011   Cymbalta [duloxetine hcl] Other (See Comments) 07/05/2011   Ibuprofen Rash 09/23/2019   Keflex [cephalexin]  06/23/2015   Macrodantin [nitrofurantoin macrocrystal] Nausea And Vomiting 01/31/2013   Mucinex  [guaifenesin  er] Other (See Comments) 06/23/2015   Tape Rash 05/14/2012    Family History  Problem Relation Age of Onset   Heart disease Mother    Hypertension Mother    Stroke Mother    Heart disease Father    Stroke Father    Hypertension Sister    Diabetes Sister    Liver cancer Brother    Hypertension Brother    Diabetes Brother    Leukemia Brother    Emphysema Brother    Allergies Daughter    Diabetes Other        all brothers and sister x 5   Breast cancer Cousin     Social History   Socioeconomic History   Marital status: Married    Spouse name: Not on file   Number of children: 2   Years of education: Not on file   Highest education level: Not on file  Occupational History   Occupation: retired    Associate Professor: RETIRED  Tobacco Use   Smoking status: Never   Smokeless tobacco: Never  Vaping Use   Vaping status: Never Used  Substance and Sexual Activity   Alcohol  use: No   Drug use: No   Sexual activity: Never  Other Topics Concern   Not on file  Social History Narrative   Not on file   Social Drivers of Health   Financial Resource Strain:  Not on file  Food Insecurity: Low Risk  (05/21/2023)   Received from [facility]   Hunger Vital Sign    Within the past 12 months, you worried that your food would run out before you got money to buy more: Never true    Within the past 12 months, the food you bought just didn't last and you didn't have money to get more. : Never true  Transportation Needs: No Transportation Needs (05/21/2023)   Received from [facility]    In the past 12 months, has lack of reliable transportation kept you from medical appointments, meetings, work or from getting things needed for daily living? : No  Physical Activity: Not on file  Stress: Not on file  Social Connections: Not on file  Intimate Partner Violence: Not on file    Review of Systems: Gen: Denies any fever, chills, cold or flulike symptoms, presyncope, syncope. CV: Denies chest pain, heart palpitations. Resp: Denies shortness of breath, cough. GI: See HPI GU : Denies urinary burning, urinary frequency, urinary hesitancy MS: Admits to diffuse joint pain. Derm: Denies rash. Psych: Denies depression, anxiety. Heme: See HPI  Physical Exam: BP 138/83 (BP Location: Left Arm, Patient Position: Sitting, Cuff Size: Normal)   Pulse (!) 108   Temp 97.6 F (36.4 C) (Temporal)   Ht 5' (1.524 m)   Wt 172 lb 6.4 oz (78.2 kg)   BMI 33.67 kg/m  General:  Alert and oriented. Pleasant and cooperative. Well-nourished and well-developed.  Head:  Normocephalic and atraumatic. Eyes:  Without icterus, sclera clear and conjunctiva pink.  Ears:  Normal auditory acuity. Lungs:  Clear to auscultation bilaterally. No wheezes, rales, or rhonchi. No distress.  Heart:  S1, S2 present without murmurs appreciated.  Abdomen:  +BS, soft, and non-distended.  Mild TTP in epigastric area. Firm area palpated in suprapubic region, likely related to underlying bladder mass. No HSM noted. No guarding or rebound. No masses appreciated.  Rectal:  Deferred   Msk:  Symmetrical without gross deformities. Normal posture. Extremities:  Without edema. Neurologic:  Alert and  oriented x4;  grossly normal neurologically. Skin:  Intact without significant lesions or rashes. Psych:  Normal mood and affect.    Assessment:  78 year old female with history of mild CAD, COPD, depression, diet-controlled diabetes, HTN, HLD, sleep apnea, subclinical hypothyroidism, GERD, presenting today for further evaluation of GERD, change in bowel habits.  GERD: Reports history of GERD in the past, but had not been much of a problem until recently.  Started on pantoprazole  40 mg daily with no improvement, continues with daily symptoms.   Nausea/vomiting: New onset in the last few months.  May be related to uncontrolled GERD versus etiology of diarrhea/change in bowel habits.  Change in bowel habits/diarrhea: New onset diarrhea x 3-4 months.  Associated nocturnal stools and black stools at times.  No BRBPR, abdominal pain.  Evaluated in the ER in March 2025 with C. difficile, GI pathogen panel negative.  No anemia.  CT with no acute abnormalities.  Etiology unclear.  In the setting of significant unintentional weight loss over the last several months, I am concerned about possible underlying malignancy.  Additional differentials include microscopic colitis, IBD, thyroid  abnormalities, celiac disease.  Black stools: Intermittent.  Unclear if this is melena or possibly related to dietary intake.  Fecal occult blood negative in March and anemia at that time.  Plan to update CBC.  In light of uncontrolled GERD, nausea, vomiting, weight loss, need to rule out PUD, gastritis, duodenitis, H. pylori, malignancy.  Unintentional weight loss: Documented 39 pound weight loss in the last 3-1/2 months.  This is in the setting of change in bowel habits with diarrhea, new onset nausea vomiting, uncontrolled GERD as discussed above, and bladder mass noted on CT in March 2025.    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  Unfortunately, patient states she was not aware of this, thus cannot receive a referral to urology.    Plan:  CBC, BMP, TSH, IgA, TTG IgA Proceed with upper endoscopy + colonoscopy with propofol  by Dr. Cindie in near future. The risks, benefits, and alternatives have been discussed with the patient in detail. The patient states understanding and desires to proceed.  ASA 3, but will schedule as ASA 2 in Endo room 1 or 2. Increase pantoprazole  to 40 mg twice daily. Imodium as needed for diarrhea.  Recommended 2 Imodium after first diarrhea type stool, then 1 additional Imodium after each additional loose stool up to a total of 4 Imodium per day. Continue Zofran  as needed. Refer to urology ASAP. Follow-up after procedures.   Josette Centers, PA-C [facility]astroenter[facility] 06/11/2024

## 2024-06-10 DIAGNOSIS — E1121 Type 2 diabetes mellitus with diabetic nephropathy: Secondary | ICD-10-CM | POA: Diagnosis not present

## 2024-06-10 DIAGNOSIS — I10 Essential (primary) hypertension: Secondary | ICD-10-CM | POA: Diagnosis not present

## 2024-06-10 DIAGNOSIS — J449 Chronic obstructive pulmonary disease, unspecified: Secondary | ICD-10-CM | POA: Diagnosis not present

## 2024-06-11 ENCOUNTER — Other Ambulatory Visit: Payer: Self-pay | Admitting: *Deleted

## 2024-06-11 ENCOUNTER — Encounter: Payer: Self-pay | Admitting: Gastroenterology

## 2024-06-11 ENCOUNTER — Ambulatory Visit: Admitting: Gastroenterology

## 2024-06-11 VITALS — BP 138/83 | HR 108 | Temp 97.6°F | Ht 60.0 in | Wt 172.4 lb

## 2024-06-11 DIAGNOSIS — R634 Abnormal weight loss: Secondary | ICD-10-CM

## 2024-06-11 DIAGNOSIS — K219 Gastro-esophageal reflux disease without esophagitis: Secondary | ICD-10-CM

## 2024-06-11 DIAGNOSIS — K921 Melena: Secondary | ICD-10-CM | POA: Diagnosis not present

## 2024-06-11 DIAGNOSIS — R197 Diarrhea, unspecified: Secondary | ICD-10-CM | POA: Diagnosis not present

## 2024-06-11 DIAGNOSIS — N3289 Other specified disorders of bladder: Secondary | ICD-10-CM

## 2024-06-11 DIAGNOSIS — R112 Nausea with vomiting, unspecified: Secondary | ICD-10-CM | POA: Diagnosis not present

## 2024-06-11 DIAGNOSIS — R195 Other fecal abnormalities: Secondary | ICD-10-CM | POA: Diagnosis not present

## 2024-06-11 DIAGNOSIS — R194 Change in bowel habit: Secondary | ICD-10-CM

## 2024-06-11 NOTE — Patient Instructions (Addendum)
 We will get you scheduled for a colonoscopy and upper endoscopy with Dr. Cindie.   Please have labs completed at [facility].   Increase pantoprazole  to 40 mg twice a day, 30 minutes before breakfast and dinner.  You can use Imodium as needed for your diarrhea.  You can take 2 Imodium after your first diarrhea type stool, then 1 additional Imodium after each additional loose stool up to a total of 4 Imodium per day.  We will refer you to urology for the bladder mass that was noted on your CT.  I will see you back in the office after your procedures.  Josette Centers, PA-C [facility] Gastroenter[facility]

## 2024-06-12 ENCOUNTER — Other Ambulatory Visit: Payer: Self-pay | Admitting: *Deleted

## 2024-06-12 ENCOUNTER — Encounter: Payer: Self-pay | Admitting: *Deleted

## 2024-06-12 LAB — CBC WITH DIFFERENTIAL/PLATELET
Absolute Lymphocytes: 2608 {cells}/uL (ref 850–3900)
Absolute Monocytes: 616 {cells}/uL (ref 200–950)
Basophils Absolute: 22 {cells}/uL (ref 0–200)
Basophils Relative: 0.4 %
Eosinophils Absolute: 238 {cells}/uL (ref 15–500)
Eosinophils Relative: 4.4 %
HCT: 40.6 % (ref 35.0–45.0)
Hemoglobin: 13.3 g/dL (ref 11.7–15.5)
MCH: 31.2 pg (ref 27.0–33.0)
MCHC: 32.8 g/dL (ref 32.0–36.0)
MCV: 95.3 fL (ref 80.0–100.0)
MPV: 10 fL (ref 7.5–12.5)
Monocytes Relative: 11.4 %
Neutro Abs: 1917 {cells}/uL (ref 1500–7800)
Neutrophils Relative %: 35.5 %
Platelets: 185 10*3/uL (ref 140–400)
RBC: 4.26 10*6/uL (ref 3.80–5.10)
RDW: 13.4 % (ref 11.0–15.0)
Total Lymphocyte: 48.3 %
WBC: 5.4 10*3/uL (ref 3.8–10.8)

## 2024-06-12 LAB — IGA: Immunoglobulin A: <5 mg/dL — ABNORMAL LOW (ref 70–320)

## 2024-06-12 LAB — BASIC METABOLIC PANEL WITH GFR
BUN: 10 mg/dL (ref 7–25)
CO2: 29 mmol/L (ref 20–32)
Calcium: 9.2 mg/dL (ref 8.6–10.4)
Chloride: 101 mmol/L (ref 98–110)
Creat: 0.86 mg/dL (ref 0.60–1.00)
Glucose, Bld: 100 mg/dL — ABNORMAL HIGH (ref 65–99)
Potassium: 3.9 mmol/L (ref 3.5–5.3)
Sodium: 138 mmol/L (ref 135–146)
eGFR: 69 mL/min/{1.73_m2} (ref >=60)

## 2024-06-12 LAB — TSH: TSH: 0.7 m[IU]/L (ref 0.40–4.50)

## 2024-06-12 LAB — TISSUE TRANSGLUTAMINASE, IGA: (tTG) Ab, IgA: <1 U/mL

## 2024-06-12 MED ORDER — PEG 3350-KCL-NA BICARB-NACL 420 G PO SOLR: 4000.0000 mL | Freq: Once | ORAL | 0 refills | Status: AC

## 2024-06-17 ENCOUNTER — Ambulatory Visit: Payer: Self-pay | Admitting: Gastroenterology

## 2024-06-17 DIAGNOSIS — R197 Diarrhea, unspecified: Secondary | ICD-10-CM

## 2024-06-18 DIAGNOSIS — L304 Erythema intertrigo: Secondary | ICD-10-CM | POA: Diagnosis not present

## 2024-06-18 DIAGNOSIS — D485 Neoplasm of uncertain behavior of skin: Secondary | ICD-10-CM | POA: Diagnosis not present

## 2024-06-18 DIAGNOSIS — L570 Actinic keratosis: Secondary | ICD-10-CM | POA: Diagnosis not present

## 2024-06-19 DIAGNOSIS — R197 Diarrhea, unspecified: Secondary | ICD-10-CM | POA: Diagnosis not present

## 2024-06-19 DIAGNOSIS — K921 Melena: Secondary | ICD-10-CM | POA: Diagnosis not present

## 2024-06-20 DIAGNOSIS — J449 Chronic obstructive pulmonary disease, unspecified: Secondary | ICD-10-CM | POA: Diagnosis not present

## 2024-06-20 DIAGNOSIS — E785 Hyperlipidemia, unspecified: Secondary | ICD-10-CM | POA: Diagnosis not present

## 2024-06-20 DIAGNOSIS — M810 Age-related osteoporosis without current pathological fracture: Secondary | ICD-10-CM | POA: Diagnosis not present

## 2024-06-20 DIAGNOSIS — J302 Other seasonal allergic rhinitis: Secondary | ICD-10-CM | POA: Diagnosis not present

## 2024-06-20 DIAGNOSIS — E538 Deficiency of other specified B group vitamins: Secondary | ICD-10-CM | POA: Diagnosis not present

## 2024-06-20 DIAGNOSIS — R053 Chronic cough: Secondary | ICD-10-CM | POA: Diagnosis not present

## 2024-06-20 DIAGNOSIS — I10 Essential (primary) hypertension: Secondary | ICD-10-CM | POA: Diagnosis not present

## 2024-06-20 DIAGNOSIS — K219 Gastro-esophageal reflux disease without esophagitis: Secondary | ICD-10-CM | POA: Diagnosis not present

## 2024-06-20 DIAGNOSIS — G629 Polyneuropathy, unspecified: Secondary | ICD-10-CM | POA: Diagnosis not present

## 2024-06-20 DIAGNOSIS — D509 Iron deficiency anemia, unspecified: Secondary | ICD-10-CM | POA: Diagnosis not present

## 2024-06-20 LAB — CBC WITH DIFFERENTIAL/PLATELET
Absolute Lymphocytes: 2507 {cells}/uL (ref 850–3900)
Absolute Monocytes: 695 {cells}/uL (ref 200–950)
Basophils Absolute: 12 {cells}/uL (ref 0–200)
Basophils Relative: 0.2 %
Eosinophils Absolute: 305 {cells}/uL (ref 15–500)
Eosinophils Relative: 5 %
HCT: 40.7 % (ref 35.0–45.0)
Hemoglobin: 13.2 g/dL (ref 11.7–15.5)
MCH: 30.9 pg (ref 27.0–33.0)
MCHC: 32.4 g/dL (ref 32.0–36.0)
MCV: 95.3 fL (ref 80.0–100.0)
MPV: 10.2 fL (ref 7.5–12.5)
Monocytes Relative: 11.4 %
Neutro Abs: 2580 {cells}/uL (ref 1500–7800)
Neutrophils Relative %: 42.3 %
Platelets: 192 Thousand/uL (ref 140–400)
RBC: 4.27 Million/uL (ref 3.80–5.10)
RDW: 13.1 % (ref 11.0–15.0)
Total Lymphocyte: 41.1 %
WBC: 6.1 Thousand/uL (ref 3.8–10.8)

## 2024-06-20 LAB — TSH: TSH: 1.06 m[IU]/L (ref 0.40–4.50)

## 2024-06-20 LAB — GLIADIN ANTIBODIES, SERUM
Gliadin IgA: <1 U/mL
Gliadin IgG: <1 U/mL

## 2024-06-20 LAB — IGA: Immunoglobulin A: <5 mg/dL — ABNORMAL LOW (ref 70–320)

## 2024-06-20 LAB — TISSUE TRANSGLUTAMINASE ABS,IGG,IGA
(tTG) Ab, IgA: <1 U/mL
(tTG) Ab, IgG: <1 U/mL

## 2024-06-30 ENCOUNTER — Ambulatory Visit (HOSPITAL_COMMUNITY): Admitting: Anesthesiology

## 2024-06-30 ENCOUNTER — Ambulatory Visit (HOSPITAL_COMMUNITY)
Admission: RE | Admit: 2024-06-30 | Discharge: 2024-06-30 | Disposition: A | Discharge status: Home / Self Care | Attending: Internal Medicine | Admitting: Internal Medicine

## 2024-06-30 ENCOUNTER — Encounter (HOSPITAL_COMMUNITY)
Admission: RE | Disposition: A | Discharge status: Home / Self Care | Payer: Self-pay | Source: Home / Self Care | Attending: Internal Medicine

## 2024-06-30 ENCOUNTER — Encounter (HOSPITAL_COMMUNITY): Payer: Self-pay | Admitting: Internal Medicine

## 2024-06-30 ENCOUNTER — Other Ambulatory Visit: Payer: Self-pay

## 2024-06-30 DIAGNOSIS — R0602 Shortness of breath: Secondary | ICD-10-CM | POA: Insufficient documentation

## 2024-06-30 DIAGNOSIS — I2510 Atherosclerotic heart disease of native coronary artery without angina pectoris: Secondary | ICD-10-CM | POA: Insufficient documentation

## 2024-06-30 DIAGNOSIS — R12 Heartburn: Secondary | ICD-10-CM

## 2024-06-30 DIAGNOSIS — R197 Diarrhea, unspecified: Secondary | ICD-10-CM

## 2024-06-30 DIAGNOSIS — D649 Anemia, unspecified: Secondary | ICD-10-CM | POA: Insufficient documentation

## 2024-06-30 DIAGNOSIS — R519 Headache, unspecified: Secondary | ICD-10-CM | POA: Insufficient documentation

## 2024-06-30 DIAGNOSIS — F32A Depression, unspecified: Secondary | ICD-10-CM | POA: Insufficient documentation

## 2024-06-30 DIAGNOSIS — E1142 Type 2 diabetes mellitus with diabetic polyneuropathy: Secondary | ICD-10-CM | POA: Insufficient documentation

## 2024-06-30 DIAGNOSIS — F419 Anxiety disorder, unspecified: Secondary | ICD-10-CM | POA: Diagnosis not present

## 2024-06-30 DIAGNOSIS — K219 Gastro-esophageal reflux disease without esophagitis: Secondary | ICD-10-CM | POA: Insufficient documentation

## 2024-06-30 DIAGNOSIS — K529 Noninfective gastroenteritis and colitis, unspecified: Secondary | ICD-10-CM | POA: Diagnosis not present

## 2024-06-30 DIAGNOSIS — I10 Essential (primary) hypertension: Secondary | ICD-10-CM | POA: Insufficient documentation

## 2024-06-30 DIAGNOSIS — E038 Other specified hypothyroidism: Secondary | ICD-10-CM | POA: Diagnosis not present

## 2024-06-30 DIAGNOSIS — G4730 Sleep apnea, unspecified: Secondary | ICD-10-CM | POA: Diagnosis not present

## 2024-06-30 DIAGNOSIS — K449 Diaphragmatic hernia without obstruction or gangrene: Secondary | ICD-10-CM | POA: Insufficient documentation

## 2024-06-30 DIAGNOSIS — K635 Polyp of colon: Secondary | ICD-10-CM

## 2024-06-30 DIAGNOSIS — Z79899 Other long term (current) drug therapy: Secondary | ICD-10-CM | POA: Insufficient documentation

## 2024-06-30 DIAGNOSIS — R112 Nausea with vomiting, unspecified: Secondary | ICD-10-CM

## 2024-06-30 DIAGNOSIS — K2970 Gastritis, unspecified, without bleeding: Secondary | ICD-10-CM | POA: Diagnosis not present

## 2024-06-30 DIAGNOSIS — J449 Chronic obstructive pulmonary disease, unspecified: Secondary | ICD-10-CM | POA: Insufficient documentation

## 2024-06-30 DIAGNOSIS — Q438 Other specified congenital malformations of intestine: Secondary | ICD-10-CM | POA: Diagnosis not present

## 2024-06-30 DIAGNOSIS — Z833 Family history of diabetes mellitus: Secondary | ICD-10-CM | POA: Insufficient documentation

## 2024-06-30 DIAGNOSIS — D122 Benign neoplasm of ascending colon: Secondary | ICD-10-CM | POA: Diagnosis not present

## 2024-06-30 HISTORY — PX: ESOPHAGOGASTRODUODENOSCOPY: SHX5428

## 2024-06-30 HISTORY — PX: COLONOSCOPY: SHX5424

## 2024-06-30 SURGERY — COLONOSCOPY
Anesthesia: General

## 2024-06-30 MED ORDER — LIDOCAINE 2% (20 MG/ML) 5 ML SYRINGE
INTRAMUSCULAR | Status: DC | PRN
Start: 2024-06-30 — End: 2024-06-30
  Administered 2024-06-30: 60 mg via INTRAVENOUS

## 2024-06-30 MED ORDER — GLYCOPYRROLATE PF 0.2 MG/ML IJ SOSY
PREFILLED_SYRINGE | INTRAMUSCULAR | Status: DC | PRN
  Administered 2024-06-30: .2 mg via INTRAVENOUS

## 2024-06-30 MED ORDER — LACTATED RINGERS IV SOLN: INTRAVENOUS | Status: DC

## 2024-06-30 MED ORDER — PROPOFOL 500 MG/50ML IV EMUL
INTRAVENOUS | Status: DC | PRN
  Administered 2024-06-30: 150 ug/kg/min via INTRAVENOUS

## 2024-06-30 NOTE — Interval H&P Note (Signed)
 History and Physical Interval Note:  06/30/2024 12:04 PM  Alexis Rios  has presented today for surgery, with the diagnosis of change in bowel habits, wt loss, GERD.  The various methods of treatment have been discussed with the patient and family. After consideration of risks, benefits and other options for treatment, the patient has consented to  Procedure(s) with comments: COLONOSCOPY (N/A) - 12:45 pm, asa 2 EGD (ESOPHAGOGASTRODUODENOSCOPY) (N/A) as a surgical intervention.  The patient's history has been reviewed, patient examined, no change in status, stable for surgery.  I have reviewed the patient's chart and labs.  Questions were answered to the patient's satisfaction.     Carlin MARLA Hasty

## 2024-06-30 NOTE — Anesthesia Preprocedure Evaluation (Signed)
 Anesthesia Evaluation  Patient identified by MRN, date of birth, ID band Patient awake    Reviewed: Allergy & Precautions, H&P , NPO status , Patient's Chart, lab work & pertinent test results, reviewed documented beta blocker date and time   History of Anesthesia Complications (+) history of anesthetic complications  Airway Mallampati: II  TM Distance: >3 FB Neck ROM: full    Dental no notable dental hx.    Pulmonary neg pulmonary ROS, shortness of breath, asthma , sleep apnea , pneumonia, COPD   Pulmonary exam normal breath sounds clear to auscultation       Cardiovascular Exercise Tolerance: Good hypertension, + CAD  negative cardio ROS  Rhythm:regular Rate:Normal     Neuro/Psych  Headaches PSYCHIATRIC DISORDERS Anxiety Depression     Neuromuscular disease negative neurological ROS  negative psych ROS   GI/Hepatic negative GI ROS, Neg liver ROS, hiatal hernia,GERD  ,,  Endo/Other  negative endocrine ROSdiabetesHypothyroidism    Renal/GU negative Renal ROS  negative genitourinary   Musculoskeletal   Abdominal   Peds  Hematology negative hematology ROS (+) Blood dyscrasia, anemia   Anesthesia Other Findings   Reproductive/Obstetrics negative OB ROS                              Anesthesia Physical Anesthesia Plan  ASA: 2  Anesthesia Plan: General   Post-op Pain Management:    Induction:   PONV Risk Score and Plan: Propofol  infusion  Airway Management Planned:   Additional Equipment:   Intra-op Plan:   Post-operative Plan:   Informed Consent: I have reviewed the patients History and Physical, chart, labs and discussed the procedure including the risks, benefits and alternatives for the proposed anesthesia with the patient or authorized representative who has indicated his/her understanding and acceptance.     Dental Advisory Given  Plan Discussed with:  CRNA  Anesthesia Plan Comments:         Anesthesia Quick Evaluation

## 2024-06-30 NOTE — Op Note (Signed)
 [facility] Patient Name: Alexis Rios Procedure Date: 06/30/2024 12:22 PM MRN: [id number] Date of Birth: 1946/04/04 Attending MD: Carlin POUR. Cindie , [place], [id number] CSN: [id number] Age: 78 Admit Type: Outpatient Procedure:                Colonoscopy Indications:              Chronic diarrhea Providers:                Carlin POUR. Cindie, DO, Emilee Tubb RN, RN, Jon Loge, [place] Wilson, Technician Referring MD:              Medicines:                See the Anesthesia note for documentation of the                            administered medications Complications:            No immediate complications. Estimated Blood Loss:     Estimated blood loss was minimal. Procedure:                Pre-Anesthesia Assessment:                           - The anesthesia plan was to use monitored                            anesthesia care (MAC).                           After obtaining informed consent, the colonoscope                            was passed under direct vision. Throughout the                            procedure, the patient's blood pressure, pulse, and                            oxygen  saturations were monitored continuously. The                            [id number]-HQ190L ([id number]) scope was introduced through                            the anus and advanced to the the cecum, identified                            by appendiceal orifice and ileocecal valve. The                            colonoscopy was somewhat difficult due to a                            redundant colon and significant  looping. Successful                            completion of the procedure was aided by applying                            abdominal pressure. The patient tolerated the                            procedure well. The quality of the bowel                            preparation was evaluated using the BBPS [facility]                            Bowel Preparation Scale)  with scores of: Right                            Colon = 3, Transverse Colon = 3 and Left Colon = 3                            (entire mucosa seen well with no residual staining,                            small fragments of stool or opaque liquid). The                            total BBPS score equals 9. Scope In: 12:38:52 PM Scope Out: 12:59:56 PM Scope Withdrawal Time: 0 hours 13 minutes 17 seconds  Total Procedure Duration: 0 hours 21 minutes 4 seconds  Findings:      A 6 mm polyp was found in the ascending colon. The polyp was sessile.       The polyp was removed with a cold snare. Resection and retrieval were       complete.      Biopsies for histology were taken with a cold forceps from the ascending       colon, transverse colon and descending colon for evaluation of       microscopic colitis.      The exam was otherwise without abnormality. Impression:               - One 6 mm polyp in the ascending colon, removed                            with a cold snare. Resected and retrieved.                           - The examination was otherwise normal.                           - Biopsies were taken with a cold forceps from the                            ascending colon, transverse colon and descending  colon for evaluation of microscopic colitis. Moderate Sedation:      Per Anesthesia Care Recommendation:           - Patient has a contact number available for                            emergencies. The signs and symptoms of potential                            delayed complications were discussed with the                            patient. Return to normal activities tomorrow.                            Written discharge instructions were provided to the                            patient.                           - Resume previous diet.                           - Continue present medications.                           - Await pathology results.                            - No repeat colonoscopy due to age.                           - Return to GI clinic in 8 weeks. 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 All rights reserved. The codes documented in this report are preliminary and upon coder review may  be revised to meet current compliance requirements. Carlin POUR. Cindie, DO Carlin POUR. Cindie, DO 06/30/2024 1:04:09 PM This report has been signed electronically. Number of Addenda: 0

## 2024-06-30 NOTE — Op Note (Signed)
 [facility] Patient Name: Alexis Rios Procedure Date: 06/30/2024 12:23 PM MRN: [id number] Date of Birth: 1946-04-08 Attending MD: Carlin POUR. Cindie , [place], [id number] CSN: [id number] Age: 78 Admit Type: Outpatient Procedure:                Upper GI endoscopy Indications:              Heartburn, Diarrhea, Nausea with vomiting Providers:                Carlin POUR. Cindie, DO, Emilee Tubb RN, RN, Jon Loge, [place] Wilson, Technician Referring MD:              Medicines:                See the Anesthesia note for documentation of the                            administered medications Complications:            No immediate complications. Estimated Blood Loss:     Estimated blood loss was minimal. Procedure:                Pre-Anesthesia Assessment:                           - The anesthesia plan was to use monitored                            anesthesia care (MAC).                           After obtaining informed consent, the endoscope was                            passed under direct vision. Throughout the                            procedure, the patient's blood pressure, pulse, and                            oxygen  saturations were monitored continuously. The                            GIF-H190 ([id number]) scope was introduced through the                            mouth, and advanced to the second part of duodenum.                            The upper GI endoscopy was accomplished without                            difficulty. The patient tolerated the procedure  well. Scope In: 12:32:34 PM Scope Out: 12:35:16 PM Total Procedure Duration: 0 hours 2 minutes 42 seconds  Findings:      The Z-line was regular and was found 38 cm from the incisors.      Minimal inflammation was found in the gastric body. Biopsies were taken       with a cold forceps for Helicobacter pylori testing.      The duodenal bulb, first portion of  the duodenum and second portion of       the duodenum were normal. Impression:               - Z-line regular, 38 cm from the incisors.                           - Gastritis. Biopsied.                           - Normal duodenal bulb, first portion of the                            duodenum and second portion of the duodenum. Moderate Sedation:      Per Anesthesia Care Recommendation:           - Patient has a contact number available for                            emergencies. The signs and symptoms of potential                            delayed complications were discussed with the                            patient. Return to normal activities tomorrow.                            Written discharge instructions were provided to the                            patient.                           - Resume previous diet.                           - Continue present medications.                           - Await pathology results.                           - Return to GI clinic in 8 weeks.                           - Use Protonix  (pantoprazole ) 40 mg PO BID. Procedure Code(s):        --- Professional ---  56760, Esophagogastroduodenoscopy, flexible,                            transoral; with biopsy, single or multiple Diagnosis Code(s):        --- Professional ---                           K29.70, Gastritis, unspecified, without bleeding                           R12, Heartburn                           R19.7, Diarrhea, unspecified                           R11.2, Nausea with vomiting, unspecified CPT copyright 2022 [employer] Association. All rights reserved. The codes documented in this report are preliminary and upon coder review may  be revised to meet current compliance requirements. Carlin POUR. Cindie, DO Carlin POUR. Cindie, DO 06/30/2024 12:37:36 PM This report has been signed electronically. Number of Addenda: 0

## 2024-06-30 NOTE — Transfer of Care (Signed)
 Immediate Anesthesia Transfer of Care Note  Patient: Alexis Rios [facility]  Procedure(s) Performed: COLONOSCOPY EGD (ESOPHAGOGASTRODUODENOSCOPY)  Patient Location: Endoscopy Unit  Anesthesia Type:General  Level of Consciousness: awake, alert , oriented, and patient cooperative  Airway & Oxygen  Therapy: Patient Spontanous Breathing  Post-op Assessment: Report given to RN, Post -op Vital signs reviewed and stable, and Patient moving all extremities X 4  Post vital signs: Reviewed and stable  Last Vitals:  Vitals Value Taken Time  BP 124/64 06/30/24 13:10  Temp 36.6 C 06/30/24 13:05  Pulse 83 06/30/24 13:10  Resp 20 06/30/24 13:10  SpO2 98 % 06/30/24 13:10    Last Pain:  Vitals:   06/30/24 1310  TempSrc:   PainSc: 0-No pain      Patients Stated Pain Goal: 2 (06/30/24 1130)  Complications: No notable events documented.

## 2024-06-30 NOTE — Discharge Instructions (Addendum)
 EGD Discharge instructions Please read the instructions outlined below and refer to this sheet in the next few weeks. These discharge instructions provide you with general information on caring for yourself after you leave the hospital. Your doctor may also give you specific instructions. While your treatment has been planned according to the most current medical practices available, unavoidable complications occasionally occur. If you have any problems or questions after discharge, please call your doctor. ACTIVITY You may resume your regular activity but move at a slower pace for the next 24 hours.  Take frequent rest periods for the next 24 hours.  Walking will help expel (get rid of) the air and reduce the bloated feeling in your abdomen.  No driving for 24 hours (because of the anesthesia (medicine) used during the test).  You may shower.  Do not sign any important legal documents or operate any machinery for 24 hours (because of the anesthesia used during the test).  NUTRITION Drink plenty of fluids.  You may resume your normal diet.  Begin with a light meal and progress to your normal diet.  Avoid alcoholic beverages for 24 hours or as instructed by your caregiver.  MEDICATIONS You may resume your normal medications unless your caregiver tells you otherwise.  WHAT YOU CAN EXPECT TODAY You may experience abdominal discomfort such as a feeling of fullness or "gas" pains.  FOLLOW-UP Your doctor will discuss the results of your test with you.  SEEK IMMEDIATE MEDICAL ATTENTION IF ANY OF THE FOLLOWING OCCUR: Excessive nausea (feeling sick to your stomach) and/or vomiting.  Severe abdominal pain and distention (swelling).  Trouble swallowing.  Temperature over 101 F (37.8 C).  Rectal bleeding or vomiting of blood.      Colonoscopy Discharge Instructions  Read the instructions outlined below and refer to this sheet in the next few weeks. These discharge instructions provide you  with general information on caring for yourself after you leave the hospital. Your doctor may also give you specific instructions. While your treatment has been planned according to the most current medical practices available, unavoidable complications occasionally occur.   ACTIVITY You may resume your regular activity, but move at a slower pace for the next 24 hours.  Take frequent rest periods for the next 24 hours.  Walking will help get rid of the air and reduce the bloated feeling in your belly (abdomen).  No driving for 24 hours (because of the medicine (anesthesia) used during the test).   Do not sign any important legal documents or operate any machinery for 24 hours (because of the anesthesia used during the test).  NUTRITION Drink plenty of fluids.  You may resume your normal diet as instructed by your doctor.  Begin with a light meal and progress to your normal diet. Heavy or fried foods are harder to digest and may make you feel sick to your stomach (nauseated).  Avoid alcoholic beverages for 24 hours or as instructed.  MEDICATIONS You may resume your normal medications unless your doctor tells you otherwise.  WHAT YOU CAN EXPECT TODAY Some feelings of bloating in the abdomen.  Passage of more gas than usual.  Spotting of blood in your stool or on the toilet paper.  IF YOU HAD POLYPS REMOVED DURING THE COLONOSCOPY: No aspirin  products for 7 days or as instructed.  No alcohol  for 7 days or as instructed.  Eat a soft diet for the next 24 hours.  FINDING OUT THE RESULTS OF YOUR TEST Not all test results  are available during your visit. If your test results are not back during the visit, make an appointment with your caregiver to find out the results. Do not assume everything is normal if you have not heard from your caregiver or the medical facility. It is important for you to follow up on all of your test results.  SEEK IMMEDIATE MEDICAL ATTENTION IF: You have more than a  spotting of blood in your stool.  Your belly is swollen (abdominal distention).  You are nauseated or vomiting.  You have a temperature over 101.  You have abdominal pain or discomfort that is severe or gets worse throughout the day.   Your EGD revealed mild amount inflammation in your stomach.  I took biopsies of this to rule out infection with a bacteria called H. pylori. Esophagus and small bowel were normal.    Your colonoscopy revealed 1 polyp(s) which I removed successfully.  Given your age, I do not think you need further colonoscopies for polyp surveillance.  Overall, your colon appeared very healthy.  I did not see any active inflammation indicative of underlying inflammatory bowel disease such as Crohn's disease or ulcerative colitis throughout your colon.  I took biopsies of your colon to further evaluate.  Await pathology results, my office will contact you.  Continue on Imodium.  Follow-up in GI office in 6 to 8 weeks. Office will contact you with appointment   I hope you have a great rest of your week!  Carlin POUR. Cindie, D.O. Gastroenterology and Hepatology [facility]

## 2024-07-01 ENCOUNTER — Encounter (HOSPITAL_COMMUNITY): Payer: Self-pay | Admitting: Internal Medicine

## 2024-07-01 LAB — SURGICAL PATHOLOGY

## 2024-07-05 NOTE — Anesthesia Postprocedure Evaluation (Signed)
 Anesthesia Post Note  Patient: Mikhala Kenan Garlitz  Procedure(s) Performed: COLONOSCOPY EGD (ESOPHAGOGASTRODUODENOSCOPY)  Patient location during evaluation: Phase II Anesthesia Type: General Level of consciousness: awake Pain management: pain level controlled Vital Signs Assessment: post-procedure vital signs reviewed and stable Respiratory status: spontaneous breathing and respiratory function stable Cardiovascular status: blood pressure returned to baseline and stable Postop Assessment: no headache and no apparent nausea or vomiting Anesthetic complications: no Comments: Late entry   No notable events documented.   Last Vitals:  Vitals:   06/30/24 1305 06/30/24 1310  BP: (!) 96/55 124/64  Pulse: 77 83  Resp: 13 20  Temp: 36.6 C   SpO2: 95% 98%    Last Pain:  Vitals:   06/30/24 1310  TempSrc:   PainSc: 0-No pain                 Yvonna JINNY Bosworth

## 2024-07-08 DIAGNOSIS — M549 Dorsalgia, unspecified: Secondary | ICD-10-CM | POA: Diagnosis not present

## 2024-07-08 DIAGNOSIS — Z79899 Other long term (current) drug therapy: Secondary | ICD-10-CM | POA: Diagnosis not present

## 2024-07-10 ENCOUNTER — Ambulatory Visit: Payer: Self-pay | Admitting: Internal Medicine

## 2024-07-11 DIAGNOSIS — K219 Gastro-esophageal reflux disease without esophagitis: Secondary | ICD-10-CM | POA: Diagnosis not present

## 2024-07-11 DIAGNOSIS — E785 Hyperlipidemia, unspecified: Secondary | ICD-10-CM | POA: Diagnosis not present

## 2024-07-11 DIAGNOSIS — I10 Essential (primary) hypertension: Secondary | ICD-10-CM | POA: Diagnosis not present

## 2024-07-11 DIAGNOSIS — E1121 Type 2 diabetes mellitus with diabetic nephropathy: Secondary | ICD-10-CM | POA: Diagnosis not present

## 2024-07-15 DIAGNOSIS — E785 Hyperlipidemia, unspecified: Secondary | ICD-10-CM | POA: Diagnosis not present

## 2024-07-15 DIAGNOSIS — J302 Other seasonal allergic rhinitis: Secondary | ICD-10-CM | POA: Diagnosis not present

## 2024-07-15 DIAGNOSIS — D509 Iron deficiency anemia, unspecified: Secondary | ICD-10-CM | POA: Diagnosis not present

## 2024-07-15 DIAGNOSIS — R053 Chronic cough: Secondary | ICD-10-CM | POA: Diagnosis not present

## 2024-07-15 DIAGNOSIS — J449 Chronic obstructive pulmonary disease, unspecified: Secondary | ICD-10-CM | POA: Diagnosis not present

## 2024-07-15 DIAGNOSIS — I10 Essential (primary) hypertension: Secondary | ICD-10-CM | POA: Diagnosis not present

## 2024-07-15 DIAGNOSIS — M810 Age-related osteoporosis without current pathological fracture: Secondary | ICD-10-CM | POA: Diagnosis not present

## 2024-07-15 DIAGNOSIS — E538 Deficiency of other specified B group vitamins: Secondary | ICD-10-CM | POA: Diagnosis not present

## 2024-07-15 DIAGNOSIS — G629 Polyneuropathy, unspecified: Secondary | ICD-10-CM | POA: Diagnosis not present

## 2024-07-15 DIAGNOSIS — K219 Gastro-esophageal reflux disease without esophagitis: Secondary | ICD-10-CM | POA: Diagnosis not present

## 2024-07-25 ENCOUNTER — Encounter: Payer: Self-pay | Admitting: Gastroenterology

## 2024-07-31 DIAGNOSIS — L304 Erythema intertrigo: Secondary | ICD-10-CM | POA: Diagnosis not present

## 2024-07-31 DIAGNOSIS — L570 Actinic keratosis: Secondary | ICD-10-CM | POA: Diagnosis not present

## 2024-08-11 DIAGNOSIS — E785 Hyperlipidemia, unspecified: Secondary | ICD-10-CM | POA: Diagnosis not present

## 2024-08-11 DIAGNOSIS — J449 Chronic obstructive pulmonary disease, unspecified: Secondary | ICD-10-CM | POA: Diagnosis not present

## 2024-08-11 DIAGNOSIS — I10 Essential (primary) hypertension: Secondary | ICD-10-CM | POA: Diagnosis not present

## 2024-08-12 DIAGNOSIS — J449 Chronic obstructive pulmonary disease, unspecified: Secondary | ICD-10-CM | POA: Diagnosis not present

## 2024-08-12 DIAGNOSIS — M810 Age-related osteoporosis without current pathological fracture: Secondary | ICD-10-CM | POA: Diagnosis not present

## 2024-08-12 DIAGNOSIS — G629 Polyneuropathy, unspecified: Secondary | ICD-10-CM | POA: Diagnosis not present

## 2024-08-12 DIAGNOSIS — E785 Hyperlipidemia, unspecified: Secondary | ICD-10-CM | POA: Diagnosis not present

## 2024-08-12 DIAGNOSIS — R053 Chronic cough: Secondary | ICD-10-CM | POA: Diagnosis not present

## 2024-08-12 DIAGNOSIS — D509 Iron deficiency anemia, unspecified: Secondary | ICD-10-CM | POA: Diagnosis not present

## 2024-08-12 DIAGNOSIS — K219 Gastro-esophageal reflux disease without esophagitis: Secondary | ICD-10-CM | POA: Diagnosis not present

## 2024-08-12 DIAGNOSIS — I10 Essential (primary) hypertension: Secondary | ICD-10-CM | POA: Diagnosis not present

## 2024-08-12 DIAGNOSIS — J302 Other seasonal allergic rhinitis: Secondary | ICD-10-CM | POA: Diagnosis not present

## 2024-08-12 DIAGNOSIS — E538 Deficiency of other specified B group vitamins: Secondary | ICD-10-CM | POA: Diagnosis not present

## 2024-08-25 ENCOUNTER — Ambulatory Visit: Admitting: Gastroenterology

## 2024-09-01 DIAGNOSIS — L570 Actinic keratosis: Secondary | ICD-10-CM | POA: Diagnosis not present

## 2024-09-01 DIAGNOSIS — L304 Erythema intertrigo: Secondary | ICD-10-CM | POA: Diagnosis not present

## 2024-09-01 NOTE — Progress Notes (Signed)
 Chief Complaint: Bladder abnormality on CT  History of Present Illness:  Alexis Rios is a 78 y.o. female who is seen in consultation from Shona Norleen PEDLAR, MD for evaluation of a bladder mass.  This was seen on CT scan done in March of this year in the emergency room where she presented with diarrhea.  She has had no gross hematuria.  She has had no recent urinary tract infections.  She currently is not a smoker.  She does have frequency urgency and occasional urgency incontinence.   Past Medical History:  Past Medical History:  Diagnosis Date   Anemia    Anxiety    Arthritis    Asthma    CAD (coronary artery disease)    Cath 2009, AV groove CX 30%, RCA 20 %   Candidiasis of skin and nail    Carpal tunnel syndrome    Chronic back pain    Chronic headaches    migraines   Complication of anesthesia    if no breathing tx before anesthesia wakes with asthma   COPD (chronic obstructive pulmonary disease) (HCC)    Depression    Diabetes mellitus    DIET CONTROLLED NOW   Diverticulosis    Gallstones    Gastroparesis    ?   GERD (gastroesophageal reflux disease)    H/O echocardiogram    Echo 09/2011: Mild LVH, EF 60-65%, grade 1 diastolic dysfunction, trivial AI, mild MR, mild LAE.   H/O exercise stress test    Dobutamine  Myoview  09/2011: No scar or ischemia, no EKG changes, study not gated.   HH (hiatus hernia)    History of gallstones    History of pneumonia    Hypercholesteremia    Hypertension    IBS (irritable bowel syndrome)    Osteoporosis    Peripheral neuropathy    LEGS AND FEET   Pneumonia LAST TIME 8 YRS AGO   S/P cardiac catheterization    LHC 03/2008: Mid AV groove circumflex 30%, mid RCA 20%, EF 60%   Seasonal allergies    Skin cancer    scc/bcc   Skin yeast infection 07/05/2015   Sleep apnea    cpap    Stones in the urinary tract    Subclinical hypothyroidism    UTI (urinary tract infection)     Past Surgical History:  Past Surgical History:   Procedure Laterality Date   ABDOMINAL HYSTERECTOMY     partial   APPENDECTOMY     ARTHOSCOPIC ROTAOR CUFF REPAIR Right    X 2   BACK SURGERY     X 4   BACK SURGERY     BREAST SURGERY     lump x 2 R   CHOLECYSTECTOMY     COLONOSCOPY N/A 06/30/2024   Procedure: COLONOSCOPY;  Surgeon: Cindie Carlin POUR, DO;  Location: [facility];  Service: Endoscopy;  Laterality: N/A;  12:45 pm, asa 2   ESOPHAGOGASTRODUODENOSCOPY N/A 06/30/2024   Procedure: EGD (ESOPHAGOGASTRODUODENOSCOPY);  Surgeon: Cindie Carlin POUR, DO;  Location: [facility];  Service: Endoscopy;  Laterality: N/A;   EYE SURGERY  2018   IOC FOR CATARACTS BOTH EYES,    EYE SURGERY  2019   YAG LASER FOR CATARACT FILM   FOOT SURGERY     R   KNEE ARTHROPLASTY Right 10/10/2018   Procedure: RIGHT TOTAL KNEE ARTHROPLASTY WITH COMPUTER NAVIGATION;  Surgeon: Fidel Rogue, MD;  Location: [facility];  Service: Orthopedics;  Laterality: Right;  Needs RNFA  KNEE SURGERY     R   NOSE SURGERY     skin cancer excision   ORIF PERIPROSTHETIC FRACTURE Right 10/23/2018   Procedure: OPEN REDUCTION INTERNAL FIXATION (ORIF) RIGHT  PERIPROSTHETIC FRACTURE;  Surgeon: Fidel Rogue, MD;  Location: [facility];  Service: Orthopedics;  Laterality: Right;   RIGHT WRIST SURGERY WITH PLATE  98/96/7980   shoulder surg     x2 R   TOE AMPUTATION     rightX 1 in past LEFT X 1 done sept 2019 at [facility]   TONSILLECTOMY     UMBILICAL HERNIA REPAIR     WRIST SURGERY     L    Allergies:  Allergies  Allergen Reactions   Sulfa Antibiotics Swelling    Mouth and throat    Aspirin -Acetaminophen -Caffeine Hives    Patient indicates that both brands of Goodys powder makes her break out in hives   Oxycodone -Acetaminophen  Itching   Codeine Itching    TAKES OK WITH BENADRYL    Cymbalta [Duloxetine Hcl] Other (See Comments)    Cannot urinate   Ibuprofen Rash   Keflex [Cephalexin]    Macrodantin [Nitrofurantoin Macrocrystal] Nausea And Vomiting   Mucinex   [Guaifenesin  Er] Other (See Comments)    Urinary retention   Tape Rash    Family History:  Family History  Problem Relation Age of Onset   Heart disease Mother    Hypertension Mother    Stroke Mother    Heart disease Father    Stroke Father    Hypertension Sister    Diabetes Sister    Liver cancer Brother    Hypertension Brother    Diabetes Brother    Leukemia Brother    Emphysema Brother    Allergies Daughter    Diabetes Other        all brothers and sister x 5   Breast cancer Cousin     Social History:  Social History   Tobacco Use   Smoking status: Never   Smokeless tobacco: Never  Vaping Use   Vaping status: Never Used  Substance Use Topics   Alcohol  use: No   Drug use: No    Review of symptoms:  Constitutional:  Negative for unexplained weight loss, night sweats, fever, chills ENT:  Negative for nose bleeds, sinus pain, painful swallowing CV:  Negative for chest pain, shortness of breath, exercise intolerance, palpitations, loss of consciousness Resp:  Negative for cough, wheezing, shortness of breath GI:  Negative for nausea, vomiting, diarrhea, bloody stools GU:  Positives noted in HPI; otherwise negative for gross hematuria, dysuria, urinary incontinence Neuro:  Negative for seizures, poor balance, limb weakness, slurred speech Psych:  Negative for lack of energy, depression, anxiety Endocrine:  Negative for polydipsia, polyuria, symptoms of hypoglycemia (dizziness, hunger, sweating) Hematologic:  Negative for anemia, purpura, petechia, prolonged or excessive bleeding, use of anticoagulants  Allergic:  Negative for difficulty breathing or choking as a result of exposure to anything; no shellfish allergy; no allergic response (rash/itch) to materials, foods  Physical exam: There were no vitals taken for this visit. GENERAL APPEARANCE:  Well appearing, well developed, well nourished, NAD HEENT: Atraumatic, Normocephalic, oropharynx clear. NECK: Supple  without lymphadenopathy or thyromegaly. LUNGS: Clear to auscultation bilaterally. HEART: Regular Rate and Rhythm without murmurs, gallops, or rubs. ABDOMEN: Soft, non-tender, No Masses. EXTREMITIES: Moves all extremities well.  Without clubbing, cyanosis, or edema. NEUROLOGIC:  Alert and oriented x 3, normal gait, CN II-XII grossly intact.  MENTAL STATUS:  Appropriate. BACK:  Non-tender to  palpation.  No CVAT SKIN:  Warm, dry and intact.    Results:  I have reviewed prior patient's records  I have reviewed referring/prior physicians records  I have reviewed urinalysis  I reviewed prior imaging studies--CT A/P in March 2025: 1. No acute findings within the abdomen or pelvis. No bowel abnormality. 2. 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 Recommend Urology consultation. 3. Aortic atherosclerosis.    Assessment: 1.  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  Overactive bladder  Plan: 1.  I described to the patient and her husband the fact that this is most likely a bladder tumor  2.  We will set up cystoscopy next available  3.  Overactive bladder guide sheet given

## 2024-09-02 ENCOUNTER — Ambulatory Visit: Admitting: Urology

## 2024-09-02 ENCOUNTER — Other Ambulatory Visit: Payer: Self-pay

## 2024-09-02 VITALS — BP 134/72 | HR 76

## 2024-09-02 DIAGNOSIS — N3281 Overactive bladder: Secondary | ICD-10-CM

## 2024-09-02 DIAGNOSIS — N3289 Other specified disorders of bladder: Secondary | ICD-10-CM

## 2024-09-02 LAB — URINALYSIS, ROUTINE W REFLEX MICROSCOPIC
Bilirubin, UA: NEGATIVE
Glucose, UA: NEGATIVE
Ketones, UA: NEGATIVE
Leukocytes,UA: NEGATIVE
Nitrite, UA: NEGATIVE
Specific Gravity, UA: 1.02 (ref 1.005–1.030)
Urobilinogen, Ur: 0.2 mg/dL (ref 0.2–1.0)
pH, UA: 6.5 (ref 5.0–7.5)

## 2024-09-02 LAB — MICROSCOPIC EXAMINATION

## 2024-09-03 ENCOUNTER — Ambulatory Visit (INDEPENDENT_AMBULATORY_CARE_PROVIDER_SITE_OTHER): Admitting: Gastroenterology

## 2024-09-03 ENCOUNTER — Encounter: Payer: Self-pay | Admitting: Gastroenterology

## 2024-09-03 VITALS — BP 97/57 | HR 62 | Temp 97.5°F | Ht 59.0 in | Wt 178.0 lb

## 2024-09-03 DIAGNOSIS — R634 Abnormal weight loss: Secondary | ICD-10-CM | POA: Diagnosis not present

## 2024-09-03 DIAGNOSIS — K529 Noninfective gastroenteritis and colitis, unspecified: Secondary | ICD-10-CM

## 2024-09-03 DIAGNOSIS — R112 Nausea with vomiting, unspecified: Secondary | ICD-10-CM

## 2024-09-03 DIAGNOSIS — K219 Gastro-esophageal reflux disease without esophagitis: Secondary | ICD-10-CM | POA: Diagnosis not present

## 2024-09-03 NOTE — Patient Instructions (Signed)
 Please complete labs between 8am-9am, nothing to eat or drink after midnight.  Please complete stool test.  Take your pain medication WITH FOOD to see if it helps your nausea and vomiting.

## 2024-09-03 NOTE — Progress Notes (Signed)
 GI Office Note    Referring Provider: Shona Norleen PEDLAR, MD Primary Care Physician:  Shona Norleen PEDLAR, MD  Primary Gastroenterologist: Carlin POUR. Cindie, DO   Chief Complaint   Chief Complaint  Patient presents with   Follow-up    Still having diarrhea    History of Present Illness   Alexis Rios is a 78 y.o. female presenting today for follow up. Seen in July with 3-4 months of diarrhea, N/V. BM 6-7 per day. Weight loss of 40 pounds. 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  Currently having 4-5 [place] 5 stools daily. No melena, brbpr. No abd pain. She is having some nausea, vomiting few times per week for the past few months. Having some breakthrough heartburn symptoms. She denies any oil in her stool. No recent antibiotics. She had her gb removed over 20 years ago and typically has not had a lot of diarrhea issues until the past several months.   She has seen urology for bladder mass and is scheduled for upcoming cytoscopy.  Overall has documented 39 pound weight loss sine 02/2024, she has gained 6 pounds back.      Wt Readings from Last 5 Encounters:  09/03/24 178 lb (80.7 kg)  06/11/24 172 lb 6.4 oz (78.2 kg)  02/24/24 211 lb 10.3 oz (96 kg)  06/21/21 213 lb 6 oz (96.8 kg)  10/23/18 198 lb (89.8 kg)    Prior Data    06/2024: celiac serologies neg 02/2024: Cdiff and GI path panel neg  CT A/P with contrast 02/2024: IMPRESSION: 1. No acute findings within the abdomen or pelvis. No bowel abnormality. 2. 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 Recommend Urology consultation. 3. Aortic atherosclerosis.   Colonoscopy 06/2024: -one 6mm polyp ascending colon, sessile serrated adenoma -random colon biopsies, neg  EGD 06/2024: -Zline regular -gastritis s/p bx, no h.pylori -normal examined duodenum    Medications   Current Outpatient Medications  Medication Sig Dispense Refill   albuterol  (VENTOLIN  HFA) 108 (90  Base) MCG/ACT inhaler Inhale 2 puffs into the lungs every 4 (four) hours as needed.     ALLERGY RELIEF 10 MG tablet Take 10 mg by mouth daily.     ALPRAZolam  (XANAX ) 0.25 MG tablet Take 0.25 mg by mouth 2 (two) times daily.     diclofenac Sodium (VOLTAREN) 1 % GEL Apply 2-4 g topically as needed.     diphenhydrAMINE  (BENADRYL ) 25 mg capsule Take 1 capsule (25 mg total) by mouth every 6 (six) hours as needed for itching. 30 capsule 0   HYDROcodone -acetaminophen  (NORCO/VICODIN) 5-325 MG tablet Take 1 tablet by mouth 3 (three) times daily. (Patient taking differently: Take 1 tablet by mouth every 6 (six) hours as needed for severe pain (pain score 7-10).)     ketoconazole (NIZORAL) 2 % cream Apply 1 Application topically 2 (two) times daily.     losartan (COZAAR) 50 MG tablet Take 50 mg by mouth daily.     meclizine  (ANTIVERT ) 12.5 MG tablet Take 12.5 mg by mouth 2 (two) times daily as needed for dizziness.     ondansetron  (ZOFRAN ) 4 MG tablet Take 1 tablet (4 mg total) by mouth every 6 (six) hours. 12 tablet 0   pantoprazole  (PROTONIX ) 40 MG tablet Take 1 tablet (40 mg total) by mouth 2 (two) times daily. Take 30 minutes before breakfast. (Patient taking differently: Take 40 mg by mouth daily. Take 30 minutes before breakfast.) 60 tablet 2  No current facility-administered medications for this visit.    Allergies   Allergies as of 09/03/2024 - Review Complete 09/03/2024  Allergen Reaction Noted   Duloxetine Other (See Comments) 02/14/2012   Sulfa antibiotics Swelling 07/05/2011   Aspirin -acetaminophen -caffeine Hives 03/17/2020   Doxycycline   09/03/2024   Oxycodone  Dermatitis and Hives 10/05/2017   Oxycodone -acetaminophen  Itching 03/17/2020   Silicone Dermatitis 06/04/2012   Codeine Itching 07/05/2011   Cymbalta [duloxetine hcl] Other (See Comments) 07/05/2011   Ibuprofen Rash 09/23/2019   Keflex [cephalexin]  06/23/2015   Macrodantin [nitrofurantoin macrocrystal] Nausea And Vomiting  01/31/2013   Mucinex  [guaifenesin  er] Other (See Comments) 06/23/2015   Tape Rash 05/14/2012       Review of Systems   General: Negative for  fever, chills, fatigue, weakness. See hpi ENT: Negative for hoarseness, difficulty swallowing , nasal congestion. CV: Negative for chest pain, angina, palpitations, dyspnea on exertion, peripheral edema.  Respiratory: Negative for dyspnea at rest, dyspnea on exertion, cough, sputum, wheezing.  GI: See history of present illness. GU:  Negative for dysuria, hematuria, urinary incontinence, urinary frequency, nocturnal urination. See hpi Endo: see hpi    Physical Exam   BP (!) 97/57 (BP Location: Right Arm, Patient Position: Sitting, Cuff Size: Normal)   Pulse 62   Temp (!) 97.5 F (36.4 C) (Oral)   Ht 4' 11 (1.499 m)   Wt 178 lb (80.7 kg)   SpO2 95%   BMI 35.95 kg/m    General: Well-nourished, well-developed in no acute distress.  Eyes: No icterus. Mouth: Oropharyngeal mucosa moist and pink   Lungs: Clear to auscultation bilaterally.  Heart: Regular rate and rhythm, no murmurs rubs or gallops.  Abdomen: Bowel sounds are normal, nontender, nondistended, no hepatosplenomegaly or masses,  no abdominal bruits or hernia , no rebound or guarding.  Rectal: not performed Extremities: No lower extremity edema. No clubbing or deformities. Neuro: Alert and oriented x 4   Skin: Warm and dry, no jaundice.   Psych: Alert and cooperative, normal mood and affect.  Labs   Lab Results  Component Value Date   NA 138 06/11/2024   CL 101 06/11/2024   K 3.9 06/11/2024   CO2 29 06/11/2024   BUN 10 06/11/2024   CREATININE 0.86 06/11/2024   EGFR 69 06/11/2024   CALCIUM  9.2 06/11/2024   ALBUMIN 3.1 (L) 02/24/2024   GLUCOSE 100 (H) 06/11/2024   Lab Results  Component Value Date   WBC 6.1 06/19/2024   HGB 13.2 06/19/2024   HCT 40.7 06/19/2024   MCV 95.3 06/19/2024   PLT 192 06/19/2024   Lab Results  Component Value Date   ALT 17 02/24/2024    AST 31 02/24/2024   ALKPHOS 44 02/24/2024   BILITOT 0.5 02/24/2024   Lab Results  Component Value Date   TSH 1.06 06/19/2024    Imaging Studies   No results found.  Assessment/Plan:   Chronic diarrhea: -at onset of symptoms, her stool studies were negative for infection -looking back through her GI records, she was evaluated for chronic diarrhea back in 2013 as well.  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 Ezzard, MHS, PA-C [facility]

## 2024-09-05 DIAGNOSIS — J302 Other seasonal allergic rhinitis: Secondary | ICD-10-CM | POA: Diagnosis not present

## 2024-09-05 DIAGNOSIS — J449 Chronic obstructive pulmonary disease, unspecified: Secondary | ICD-10-CM | POA: Diagnosis not present

## 2024-09-05 DIAGNOSIS — K219 Gastro-esophageal reflux disease without esophagitis: Secondary | ICD-10-CM | POA: Diagnosis not present

## 2024-09-05 DIAGNOSIS — R053 Chronic cough: Secondary | ICD-10-CM | POA: Diagnosis not present

## 2024-09-05 DIAGNOSIS — I10 Essential (primary) hypertension: Secondary | ICD-10-CM | POA: Diagnosis not present

## 2024-09-05 DIAGNOSIS — M810 Age-related osteoporosis without current pathological fracture: Secondary | ICD-10-CM | POA: Diagnosis not present

## 2024-09-05 DIAGNOSIS — E538 Deficiency of other specified B group vitamins: Secondary | ICD-10-CM | POA: Diagnosis not present

## 2024-09-05 DIAGNOSIS — E785 Hyperlipidemia, unspecified: Secondary | ICD-10-CM | POA: Diagnosis not present

## 2024-09-05 DIAGNOSIS — G629 Polyneuropathy, unspecified: Secondary | ICD-10-CM | POA: Diagnosis not present

## 2024-09-05 DIAGNOSIS — D509 Iron deficiency anemia, unspecified: Secondary | ICD-10-CM | POA: Diagnosis not present

## 2024-09-08 DIAGNOSIS — R197 Diarrhea, unspecified: Secondary | ICD-10-CM | POA: Diagnosis not present

## 2024-09-09 LAB — TISSUE TRANSGLUTAMINASE ABS,IGG,IGA
(tTG) Ab, IgA: <1 U/mL
(tTG) Ab, IgG: <1 U/mL

## 2024-09-09 LAB — GLIADIN ANTIBODIES, SERUM
Gliadin IgA: <1 U/mL
Gliadin IgG: <1 U/mL

## 2024-09-15 NOTE — Progress Notes (Unsigned)
 Assessment: 1.  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  Overactive bladder  Plan:   History of Present Illness:  9.23.2025: Initial visit for evaluation of a bladder mass.  This was seen on CT scan done in March of this year in the emergency room where she presented with diarrhea.  She has had no gross hematuria.  She has had no recent urinary tract infections.  She currently is not a smoker.  She does have frequency urgency and occasional urgency incontinence.  10.7.2025:  Past Medical History:  Past Medical History:  Diagnosis Date   Anemia    Anxiety    Arthritis    Asthma    CAD (coronary artery disease)    Cath 2009, AV groove CX 30%, RCA 20 %   Candidiasis of skin and nail    Carpal tunnel syndrome    Chronic back pain    Chronic headaches    migraines   Complication of anesthesia    if no breathing tx before anesthesia wakes with asthma   COPD (chronic obstructive pulmonary disease) (HCC)    Depression    Diabetes mellitus    DIET CONTROLLED NOW   Diverticulosis    Gallstones    Gastroparesis    ?   GERD (gastroesophageal reflux disease)    H/O echocardiogram    Echo 09/2011: Mild LVH, EF 60-65%, grade 1 diastolic dysfunction, trivial AI, mild MR, mild LAE.   H/O exercise stress test    Dobutamine  Myoview  09/2011: No scar or ischemia, no EKG changes, study not gated.   HH (hiatus hernia)    History of gallstones    History of pneumonia    Hypercholesteremia    Hypertension    IBS (irritable bowel syndrome)    Osteoporosis    Peripheral neuropathy    LEGS AND FEET   Pneumonia LAST TIME 8 YRS AGO   S/P cardiac catheterization    LHC 03/2008: Mid AV groove circumflex 30%, mid RCA 20%, EF 60%   Seasonal allergies    Skin cancer    scc/bcc   Skin yeast infection 07/05/2015   Sleep apnea    cpap    Stones in the urinary tract    Subclinical hypothyroidism    UTI (urinary tract infection)     Past Surgical  History:  Past Surgical History:  Procedure Laterality Date   ABDOMINAL HYSTERECTOMY     partial   APPENDECTOMY     ARTHOSCOPIC ROTAOR CUFF REPAIR Right    X 2   BACK SURGERY     X 4   BACK SURGERY     BREAST SURGERY     lump x 2 R   CHOLECYSTECTOMY     COLONOSCOPY N/A 06/30/2024   Procedure: COLONOSCOPY;  Surgeon: Cindie Carlin POUR, DO;  [facility];  Service: Endoscopy;  Laterality: N/A;  12:45 pm, asa 2   ESOPHAGOGASTRODUODENOSCOPY N/A 06/30/2024   Procedure: EGD (ESOPHAGOGASTRODUODENOSCOPY);  Surgeon: Cindie Carlin POUR, DO;  [facility]: [facility];  Service: Endoscopy;  Laterality: N/A;   EYE SURGERY  2018   IOC FOR CATARACTS BOTH EYES,    EYE SURGERY  2019   YAG LASER FOR CATARACT FILM   FOOT SURGERY     R   KNEE ARTHROPLASTY Right 10/10/2018   Procedure: RIGHT TOTAL KNEE ARTHROPLASTY WITH COMPUTER NAVIGATION;  Surgeon: Fidel Rogue, MD;  Location: [facility];  Service: Orthopedics;  Laterality: Right;  Needs  RNFA   KNEE SURGERY     R   NOSE SURGERY     skin cancer excision   ORIF PERIPROSTHETIC FRACTURE Right 10/23/2018   Procedure: OPEN REDUCTION INTERNAL FIXATION (ORIF) RIGHT  PERIPROSTHETIC FRACTURE;  Surgeon: Fidel Rogue, MD;  Location: [facility];  Service: Orthopedics;  Laterality: Right;   RIGHT WRIST SURGERY WITH PLATE  98/96/7980   shoulder surg     x2 R   TOE AMPUTATION     rightX 1 in past LEFT X 1 done sept 2019 at [facility]   TONSILLECTOMY     UMBILICAL HERNIA REPAIR     WRIST SURGERY     L    Allergies:  Allergies  Allergen Reactions   Duloxetine Other (See Comments)    duloxetine   Sulfa Antibiotics Swelling    Mouth and throat    Aspirin -Acetaminophen -Caffeine Hives    Patient indicates that both brands of [employer] powder makes her break out in hives   Doxycycline      Other Reaction(s): Not available   Oxycodone  Dermatitis and Hives   Oxycodone -Acetaminophen  Itching   Silicone Dermatitis    silicones   Codeine Itching    TAKES OK  WITH BENADRYL    Cymbalta [Duloxetine Hcl] Other (See Comments)    Cannot urinate   Ibuprofen Rash   Keflex [Cephalexin]    Macrodantin [Nitrofurantoin Macrocrystal] Nausea And Vomiting   Mucinex  [Guaifenesin  Er] Other (See Comments)    Urinary retention   Tape Rash    Family History:  Family History  Problem Relation Age of Onset   Heart disease Mother    Hypertension Mother    Stroke Mother    Heart disease Father    Stroke Father    Hypertension Sister    Diabetes Sister    Liver cancer Brother    Hypertension Brother    Diabetes Brother    Leukemia Brother    Emphysema Brother    Allergies Daughter    Diabetes Other        all brothers and sister x 5   Breast cancer Cousin     Social History:  Social History   Tobacco Use   Smoking status: Never   Smokeless tobacco: Never  Vaping Use   Vaping status: Never Used  Substance Use Topics   Alcohol  use: No   Drug use: No   Indication: Bladder mass on CT  After informed consent and discussion of the procedure and its risks, pt was positioned and prepped in the standard fashion.  Cystoscopy was performed with a flexible cystoscope.   Findings:  Urethra:*** Ureteral orifices:*** Bladder:***    Results:  I have reviewed prior patient's records  I have reviewed referring/prior physicians records  I have reviewed urinalysis Prior imaging studies--CT A/P in March 2025: 1. No acute findings within the abdomen or pelvis. No bowel abnormality. 2. 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 Recommend Urology consultation. 3. Aortic atherosclerosis.

## 2024-09-16 ENCOUNTER — Ambulatory Visit (INDEPENDENT_AMBULATORY_CARE_PROVIDER_SITE_OTHER): Admitting: Urology

## 2024-09-16 VITALS — BP 134/74 | HR 60

## 2024-09-16 DIAGNOSIS — N3281 Overactive bladder: Secondary | ICD-10-CM | POA: Diagnosis not present

## 2024-09-16 DIAGNOSIS — N3289 Other specified disorders of bladder: Secondary | ICD-10-CM | POA: Diagnosis not present

## 2024-09-16 LAB — URINALYSIS, ROUTINE W REFLEX MICROSCOPIC
Bilirubin, UA: NEGATIVE
Glucose, UA: NEGATIVE
Ketones, UA: NEGATIVE
Leukocytes,UA: NEGATIVE
Nitrite, UA: NEGATIVE
Specific Gravity, UA: 1.01 (ref 1.005–1.030)
Urobilinogen, Ur: 0.2 mg/dL (ref 0.2–1.0)
pH, UA: 6 (ref 5.0–7.5)

## 2024-09-16 LAB — MICROSCOPIC EXAMINATION: RBC, Urine: >30 /HPF — AB (ref 0–2)

## 2024-09-16 MED ORDER — CIPROFLOXACIN HCL 500 MG PO TABS: 500.0000 mg | ORAL_TABLET | Freq: Once | ORAL | Status: AC

## 2024-10-03 DIAGNOSIS — G629 Polyneuropathy, unspecified: Secondary | ICD-10-CM | POA: Diagnosis not present

## 2024-10-03 DIAGNOSIS — E785 Hyperlipidemia, unspecified: Secondary | ICD-10-CM | POA: Diagnosis not present

## 2024-10-03 DIAGNOSIS — J449 Chronic obstructive pulmonary disease, unspecified: Secondary | ICD-10-CM | POA: Diagnosis not present

## 2024-10-03 DIAGNOSIS — I10 Essential (primary) hypertension: Secondary | ICD-10-CM | POA: Diagnosis not present

## 2024-10-03 DIAGNOSIS — J302 Other seasonal allergic rhinitis: Secondary | ICD-10-CM | POA: Diagnosis not present

## 2024-10-03 DIAGNOSIS — E1121 Type 2 diabetes mellitus with diabetic nephropathy: Secondary | ICD-10-CM | POA: Diagnosis not present

## 2024-10-03 DIAGNOSIS — D509 Iron deficiency anemia, unspecified: Secondary | ICD-10-CM | POA: Diagnosis not present

## 2024-10-03 DIAGNOSIS — K219 Gastro-esophageal reflux disease without esophagitis: Secondary | ICD-10-CM | POA: Diagnosis not present

## 2024-10-03 DIAGNOSIS — E538 Deficiency of other specified B group vitamins: Secondary | ICD-10-CM | POA: Diagnosis not present

## 2024-10-03 DIAGNOSIS — M810 Age-related osteoporosis without current pathological fracture: Secondary | ICD-10-CM | POA: Diagnosis not present

## 2024-10-13 LAB — CBC WITH DIFFERENTIAL/PLATELET
Basophils Absolute: 0 x10E3/uL (ref 0.0–0.2)
Basos: 0 %
EOS (ABSOLUTE): 0.3 x10E3/uL (ref 0.0–0.4)
Eos: 4 %
Hematocrit: 41.5 % (ref 34.0–46.6)
Hemoglobin: 12.8 g/dL (ref 11.1–15.9)
Immature Grans (Abs): 0 x10E3/uL (ref 0.0–0.1)
Immature Granulocytes: 0 %
Lymphocytes Absolute: 4.1 x10E3/uL — ABNORMAL HIGH (ref 0.7–3.1)
Lymphs: 60 %
MCH: 29.8 pg (ref 26.6–33.0)
MCHC: 30.8 g/dL — ABNORMAL LOW (ref 31.5–35.7)
MCV: 97 fL (ref 79–97)
Monocytes Absolute: 0.7 x10E3/uL (ref 0.1–0.9)
Monocytes: 11 %
Neutrophils Absolute: 1.7 x10E3/uL (ref 1.4–7.0)
Neutrophils: 25 %
Platelets: 190 x10E3/uL (ref 150–450)
RBC: 4.3 x10E6/uL (ref 3.77–5.28)
RDW: 14 % (ref 11.7–15.4)
WBC: 6.8 x10E3/uL (ref 3.4–10.8)

## 2024-10-13 LAB — COMPREHENSIVE METABOLIC PANEL WITH GFR
ALT: 10 IU/L (ref 0–32)
AST: 17 IU/L (ref 0–40)
Albumin: 4 g/dL (ref 3.8–4.8)
Alkaline Phosphatase: 78 IU/L (ref 49–135)
BUN/Creatinine Ratio: 12 (ref 12–28)
BUN: 10 mg/dL (ref 8–27)
Bilirubin Total: 0.5 mg/dL (ref 0.0–1.2)
CO2: 26 mmol/L (ref 20–29)
Calcium: 9.3 mg/dL (ref 8.7–10.3)
Chloride: 102 mmol/L (ref 96–106)
Creatinine, Ser: 0.86 mg/dL (ref 0.57–1.00)
Globulin, Total: 2.6 g/dL (ref 1.5–4.5)
Glucose: 90 mg/dL (ref 70–99)
Potassium: 4 mmol/L (ref 3.5–5.2)
Sodium: 141 mmol/L (ref 134–144)
Total Protein: 6.6 g/dL (ref 6.0–8.5)
eGFR: 69 mL/min/1.73 (ref >59)

## 2024-10-13 LAB — CORTISOL-AM, BLOOD: Cortisol - AM: 8.4 ug/dL (ref 6.2–19.4)

## 2024-10-13 LAB — LIPASE: Lipase: 15 U/L (ref 14–85)

## 2024-10-21 ENCOUNTER — Ambulatory Visit: Payer: Self-pay | Admitting: Gastroenterology

## 2024-10-31 LAB — PANCREATIC ELASTASE, FECAL: Pancreatic Elastase, Fecal: 457 ug Elast./g (ref >200)

## 2024-11-04 ENCOUNTER — Other Ambulatory Visit: Payer: Self-pay | Admitting: Urology

## 2024-11-04 ENCOUNTER — Telehealth: Payer: Self-pay

## 2024-11-04 DIAGNOSIS — N3289 Other specified disorders of bladder: Secondary | ICD-10-CM

## 2024-11-04 NOTE — Telephone Encounter (Signed)
 Pt is made aware and she wants to proceed with procedure. Pt is aware message will be sent back to MD This is from the tumor in her bladder. Please call her about that. I thought that we were going to set her up for Dr. Sherrilee to trim that out. Did she say that she did not want that done?

## 2024-11-04 NOTE — Telephone Encounter (Signed)
 Patient is passing small blood clots during urination.  Please advise as soon as possible.  Call:  [phone]

## 2024-11-04 NOTE — Telephone Encounter (Signed)
 Return call to patient she state's she is passing  small blood clots and this stated yesterday. Pt  is aware a message will be sent to MD on recommendation. Voiced understanding

## 2024-11-05 NOTE — Telephone Encounter (Signed)
 Yes, patient previously refused surgery.  I had scheduled her a follow up with you on 01/06 because she refused.  I will call her to set up surgery details.  Ok to cancel 01/06 appt if she decides to move forward with surgery?

## 2024-11-14 MED ORDER — CHOLESTYRAMINE 4 GM/DOSE PO POWD: 4.0000 g | Freq: Every day | ORAL | 1 refills | Status: DC

## 2024-12-15 ENCOUNTER — Encounter: Payer: Self-pay | Admitting: Internal Medicine

## 2024-12-15 ENCOUNTER — Ambulatory Visit: Admitting: Internal Medicine

## 2024-12-15 VITALS — BP 107/67 | HR 87 | Temp 97.5°F | Ht 59.0 in | Wt 173.2 lb

## 2024-12-15 DIAGNOSIS — R112 Nausea with vomiting, unspecified: Secondary | ICD-10-CM

## 2024-12-15 DIAGNOSIS — K219 Gastro-esophageal reflux disease without esophagitis: Secondary | ICD-10-CM

## 2024-12-15 DIAGNOSIS — R634 Abnormal weight loss: Secondary | ICD-10-CM

## 2024-12-15 DIAGNOSIS — K529 Noninfective gastroenteritis and colitis, unspecified: Secondary | ICD-10-CM

## 2024-12-15 MED ORDER — ONDANSETRON HCL 4 MG PO TABS: 4.0000 mg | ORAL_TABLET | Freq: Three times a day (TID) | ORAL | 1 refills | Status: AC | PRN

## 2024-12-15 MED ORDER — CHOLESTYRAMINE 4 GM/DOSE PO POWD: 4.0000 g | Freq: Every day | ORAL | 1 refills | Status: AC

## 2024-12-15 NOTE — Progress Notes (Unsigned)
 "   Referring Provider: Shona Norleen PEDLAR, MD Primary Care Physician:  Shona Norleen PEDLAR, MD Primary GI:  Dr. Cindie  Chief Complaint  Patient presents with   Follow-up    Pt arrives for follow up. Pt states she has had nausea every morning and unsure of cause. Having diarrhea for at least 6 months; states nothing she takes will control it. Goes about 5 times a day.     HPI:   Alexis Rios is a 79 y.o. female who presents   Past Medical History:  Diagnosis Date   Anemia    Anxiety    Arthritis    Asthma    CAD (coronary artery disease)    Cath 2009, AV groove CX 30%, RCA 20 %   Candidiasis of skin and nail    Carpal tunnel syndrome    Chronic back pain    Chronic headaches    migraines   Complication of anesthesia    if no breathing tx before anesthesia wakes with asthma   COPD (chronic obstructive pulmonary disease) (HCC)    Depression    Diabetes mellitus    DIET CONTROLLED NOW   Diverticulosis    Gallstones    Gastroparesis    ?   GERD (gastroesophageal reflux disease)    H/O echocardiogram    Echo 09/2011: Mild LVH, EF 60-65%, grade 1 diastolic dysfunction, trivial AI, mild MR, mild LAE.   H/O exercise stress test    Dobutamine  Myoview  09/2011: No scar or ischemia, no EKG changes, study not gated.   HH (hiatus hernia)    History of gallstones    History of pneumonia    Hypercholesteremia    Hypertension    IBS (irritable bowel syndrome)    Osteoporosis    Peripheral neuropathy    LEGS AND FEET   Pneumonia LAST TIME 8 YRS AGO   S/P cardiac catheterization    LHC 03/2008: Mid AV groove circumflex 30%, mid RCA 20%, EF 60%   Seasonal allergies    Skin cancer    scc/bcc   Skin yeast infection 07/05/2015   Sleep apnea    cpap    Stones in the urinary tract    Subclinical hypothyroidism    UTI (urinary tract infection)     Past Surgical History:  Procedure Laterality Date   ABDOMINAL HYSTERECTOMY     partial   APPENDECTOMY     ARTHOSCOPIC ROTAOR CUFF REPAIR  Right    X 2   BACK SURGERY     X 4   BACK SURGERY     BREAST SURGERY     lump x 2 R   CHOLECYSTECTOMY     COLONOSCOPY N/A 06/30/2024   Procedure: COLONOSCOPY;  Surgeon: Cindie Carlin POUR, DO;  [facility];  Service: Endoscopy;  Laterality: N/A;  12:45 pm, asa 2   ESOPHAGOGASTRODUODENOSCOPY N/A 06/30/2024   Procedure: EGD (ESOPHAGOGASTRODUODENOSCOPY);  Surgeon: Cindie Carlin POUR, DO;  [facility];  Service: Endoscopy;  Laterality: N/A;   EYE SURGERY  2018   IOC FOR CATARACTS BOTH EYES,    EYE SURGERY  2019   YAG LASER FOR CATARACT FILM   FOOT SURGERY     R   KNEE ARTHROPLASTY Right 10/10/2018   Procedure: RIGHT TOTAL KNEE ARTHROPLASTY WITH COMPUTER NAVIGATION;  Surgeon: Fidel Rogue, MD;  Location: [facility];  Service: Orthopedics;  Laterality: Right;  Needs RNFA   KNEE SURGERY     R   NOSE SURGERY  skin cancer excision   ORIF PERIPROSTHETIC FRACTURE Right 10/23/2018   Procedure: OPEN REDUCTION INTERNAL FIXATION (ORIF) RIGHT  PERIPROSTHETIC FRACTURE;  Surgeon: Fidel Rogue, MD;  Location: [facility];  Service: Orthopedics;  Laterality: Right;   RIGHT WRIST SURGERY WITH PLATE  98/96/7980   shoulder surg     x2 R   TOE AMPUTATION     rightX 1 in past LEFT X 1 done sept 2019 at [facility]   TONSILLECTOMY     UMBILICAL HERNIA REPAIR     WRIST SURGERY     L    Current Outpatient Medications  Medication Sig Dispense Refill   albuterol  (VENTOLIN  HFA) 108 (90 Base) MCG/ACT inhaler Inhale 2 puffs into the lungs every 4 (four) hours as needed.     ALLERGY RELIEF 10 MG tablet Take 10 mg by mouth daily.     ALPRAZolam  (XANAX ) 0.25 MG tablet Take 0.25 mg by mouth 2 (two) times daily.     cholestyramine  (QUESTRAN ) 4 GM/DOSE powder Take 1 packet (4 g total) by mouth daily. Do not take within 2 hours of other medications 378 g 1   diclofenac Sodium (VOLTAREN) 1 % GEL Apply 2-4 g topically as needed.     diphenhydrAMINE  (BENADRYL ) 25 mg capsule Take 1 capsule (25 mg total)  by mouth every 6 (six) hours as needed for itching. 30 capsule 0   HYDROcodone -acetaminophen  (NORCO/VICODIN) 5-325 MG tablet Take 1 tablet by mouth 3 (three) times daily. (Patient taking differently: Take 1 tablet by mouth every 6 (six) hours as needed for severe pain (pain score 7-10).)     ketoconazole (NIZORAL) 2 % cream Apply 1 Application topically 2 (two) times daily.     losartan (COZAAR) 50 MG tablet Take 50 mg by mouth daily.     meclizine  (ANTIVERT ) 12.5 MG tablet Take 12.5 mg by mouth 2 (two) times daily as needed for dizziness.     ondansetron  (ZOFRAN ) 4 MG tablet Take 1 tablet (4 mg total) by mouth every 6 (six) hours. 12 tablet 0   pantoprazole  (PROTONIX ) 40 MG tablet Take 1 tablet (40 mg total) by mouth 2 (two) times daily. Take 30 minutes before breakfast. (Patient taking differently: Take 40 mg by mouth daily. Take 30 minutes before breakfast.) 60 tablet 2   Current Facility-Administered Medications  Medication Dose Route Frequency Provider Last Rate Last Admin   ciprofloxacin  (CIPRO ) tablet 500 mg  500 mg Oral Once Dahlstedt, Stephen, MD        Allergies as of 12/15/2024 - Review Complete 12/15/2024  Allergen Reaction Noted   Duloxetine Other (See Comments) 02/14/2012   Sulfa antibiotics Swelling 07/05/2011   Aspirin -acetaminophen -caffeine Hives 03/17/2020   Doxycycline   09/03/2024   Oxycodone  Dermatitis and Hives 10/05/2017   Oxycodone -acetaminophen  Itching 03/17/2020   Silicone Dermatitis 06/04/2012   Codeine Itching 07/05/2011   Cymbalta [duloxetine hcl] Other (See Comments) 07/05/2011   Ibuprofen Rash 09/23/2019   Keflex [cephalexin]  06/23/2015   Macrodantin [nitrofurantoin macrocrystal] Nausea And Vomiting 01/31/2013   Mucinex  [guaifenesin  er] Other (See Comments) 06/23/2015   Tape Rash 05/14/2012    Family History  Problem Relation Age of Onset   Heart disease Mother    Hypertension Mother    Stroke Mother    Heart disease Father    Stroke Father     Hypertension Sister    Diabetes Sister    Liver cancer Brother    Hypertension Brother    Diabetes Brother    Leukemia Brother  Emphysema Brother    Allergies Daughter    Diabetes Other        all brothers and sister x 5   Breast cancer Cousin     Social History   Socioeconomic History   Marital status: Married    Spouse name: Not on file   Number of children: 2   Years of education: Not on file   Highest education level: Not on file  Occupational History   Occupation: retired    Associate Professor: RETIRED  Tobacco Use   Smoking status: Never   Smokeless tobacco: Never  Vaping Use   Vaping status: Never Used  Substance and Sexual Activity   Alcohol  use: No   Drug use: No   Sexual activity: Never  Other Topics Concern   Not on file  Social History Narrative   Not on file   Social Drivers of Health   Tobacco Use: Low Risk (12/15/2024)   Patient History    Smoking Tobacco Use: Never    Smokeless Tobacco Use: Never    Passive Exposure: Not on file  Financial Resource Strain: Not on file  Food Insecurity: Low Risk (05/21/2023)   Received from [facility]   Epic    Within the past 12 months, you worried that your food would run out before you got money to buy more: Never true    Within the past 12 months, the food you bought just didn't last and you didn't have money to get more. : Never true  Transportation Needs: No Transportation Needs (05/21/2023)   Received from [facility]    In the past 12 months, has lack of reliable transportation kept you from medical appointments, meetings, work or from getting things needed for daily living? : No  Physical Activity: Not on file  Stress: Not on file  Social Connections: Not on file  Depression (EYV7-0): Not on file  Alcohol  Screen: Not on file  Housing: Low Risk (05/21/2023)   Received from [facility]   Epic    What is your living situation today?: I have a steady place to live    Think about the place  you live. Do you have problems with any of the following? Choose all that apply:: None/None on this list  Utilities: Low Risk (05/21/2023)   Received from [facility]   Utilities    In the past 12 months has the electric, gas, oil, or water  company threatened to shut off services in your home? : No  Health Literacy: Not on file    Subjective: ROS   Objective: BP 107/67   Pulse 87   Temp (!) 97.5 F (36.4 C)   Ht 4' 11 (1.499 m)   Wt 173 lb 3.2 oz (78.6 kg)   BMI 34.98 kg/m  Physical Exam   Assessment: *  Plan:   12/15/2024 3:00 PM  "

## 2024-12-15 NOTE — Patient Instructions (Signed)
 I am going to send in cholestyramine  powder to [facility].  Take 4 g daily, mix with water  or other clear liquid of your choice.  You need to wait at least 2 hours after other medications before taking this however.  I have also refilled your ondansetron  to take as needed for your nausea.  Follow-up in 6 to 8 weeks.  It was very nice seeing you today.  Dr. Cindie

## 2024-12-15 NOTE — Progress Notes (Deleted)
 "   Assessment: 1.  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  Overactive bladder  Plan: 1.  I spoke with the patient about cystoscopy, TURBT and gemcitabine  2.  She would like this done in [place].  We will work on getting this set up for Dr. Sherrilee to do  History of Present Illness:  9.23.2025: Initial visit for evaluation of a bladder mass.  This was seen on CT scan done in March of this year in the emergency room where she presented with diarrhea.  She has had no gross hematuria.  She has had no recent urinary tract infections.  She currently is not a smoker.  She does have frequency urgency and occasional urgency incontinence. 1.6.2025:   Past Medical History:  Past Medical History:  Diagnosis Date   Anemia    Anxiety    Arthritis    Asthma    CAD (coronary artery disease)    Cath 2009, AV groove CX 30%, RCA 20 %   Candidiasis of skin and nail    Carpal tunnel syndrome    Chronic back pain    Chronic headaches    migraines   Complication of anesthesia    if no breathing tx before anesthesia wakes with asthma   COPD (chronic obstructive pulmonary disease) (HCC)    Depression    Diabetes mellitus    DIET CONTROLLED NOW   Diverticulosis    Gallstones    Gastroparesis    ?   GERD (gastroesophageal reflux disease)    H/O echocardiogram    Echo 09/2011: Mild LVH, EF 60-65%, grade 1 diastolic dysfunction, trivial AI, mild MR, mild LAE.   H/O exercise stress test    Dobutamine  Myoview  09/2011: No scar or ischemia, no EKG changes, study not gated.   HH (hiatus hernia)    History of gallstones    History of pneumonia    Hypercholesteremia    Hypertension    IBS (irritable bowel syndrome)    Osteoporosis    Peripheral neuropathy    LEGS AND FEET   Pneumonia LAST TIME 8 YRS AGO   S/P cardiac catheterization    LHC 03/2008: Mid AV groove circumflex 30%, mid RCA 20%, EF 60%   Seasonal  allergies    Skin cancer    scc/bcc   Skin yeast infection 07/05/2015   Sleep apnea    cpap    Stones in the urinary tract    Subclinical hypothyroidism    UTI (urinary tract infection)     Past Surgical History:  Past Surgical History:  Procedure Laterality Date   ABDOMINAL HYSTERECTOMY     partial   APPENDECTOMY     ARTHOSCOPIC ROTAOR CUFF REPAIR Right    X 2   BACK SURGERY     X 4   BACK SURGERY     BREAST SURGERY     lump x 2 R   CHOLECYSTECTOMY     COLONOSCOPY N/A 06/30/2024   Procedure: COLONOSCOPY;  Surgeon: Cindie Carlin POUR, DO;  Location: [facility];  Service: Endoscopy;  Laterality: N/A;  12:45 pm, asa 2   ESOPHAGOGASTRODUODENOSCOPY N/A 06/30/2024   Procedure: EGD (ESOPHAGOGASTRODUODENOSCOPY);  Surgeon: Cindie Carlin POUR, DO;  Location: [facility];  Service: Endoscopy;  Laterality: N/A;   EYE SURGERY  2018   IOC FOR CATARACTS BOTH EYES,    EYE SURGERY  2019   YAG LASER FOR CATARACT FILM  FOOT SURGERY     R   KNEE ARTHROPLASTY Right 10/10/2018   Procedure: RIGHT TOTAL KNEE ARTHROPLASTY WITH COMPUTER NAVIGATION;  Surgeon: Fidel Rogue, MD;  Location: [facility];  Service: Orthopedics;  Laterality: Right;  Needs RNFA   KNEE SURGERY     R   NOSE SURGERY     skin cancer excision   ORIF PERIPROSTHETIC FRACTURE Right 10/23/2018   Procedure: OPEN REDUCTION INTERNAL FIXATION (ORIF) RIGHT  PERIPROSTHETIC FRACTURE;  Surgeon: Fidel Rogue, MD;  Location: [facility];  Service: Orthopedics;  Laterality: Right;   RIGHT WRIST SURGERY WITH PLATE  98/96/7980   shoulder surg     x2 R   TOE AMPUTATION     rightX 1 in past LEFT X 1 done sept 2019 at [facility]   TONSILLECTOMY     UMBILICAL HERNIA REPAIR     WRIST SURGERY     L    Allergies:  Allergies  Allergen Reactions   Duloxetine Other (See Comments)    duloxetine   Sulfa Antibiotics Swelling    Mouth and throat    Aspirin -Acetaminophen -Caffeine Hives    Patient indicates that both brands of [employer] powder makes  her break out in hives   Doxycycline      Other Reaction(s): Not available   Oxycodone  Dermatitis and Hives   Oxycodone -Acetaminophen  Itching   Silicone Dermatitis    silicones   Codeine Itching    TAKES OK WITH BENADRYL    Cymbalta [Duloxetine Hcl] Other (See Comments)    Cannot urinate   Ibuprofen Rash   Keflex [Cephalexin]    Macrodantin [Nitrofurantoin Macrocrystal] Nausea And Vomiting   Mucinex  [Guaifenesin  Er] Other (See Comments)    Urinary retention   Tape Rash    Family History:  Family History  Problem Relation Age of Onset   Heart disease Mother    Hypertension Mother    Stroke Mother    Heart disease Father    Stroke Father    Hypertension Sister    Diabetes Sister    Liver cancer Brother    Hypertension Brother    Diabetes Brother    Leukemia Brother    Emphysema Brother    Allergies Daughter    Diabetes Other        all brothers and sister x 5   Breast cancer Cousin     Social History:  Social History   Tobacco Use   Smoking status: Never   Smokeless tobacco: Never  Vaping Use   Vaping status: Never Used  Substance Use Topics   Alcohol  use: No   Drug use: No   Indication: Bladder mass on CT  After informed consent and discussion of the procedure and its risks, pt was positioned and prepped in the standard fashion.  Cystoscopy was performed with a flexible cystoscope.   Findings:  Urethra: Normal Ureteral orifices: Bilaterally Bladder: 2 cm papillary lesion lateral to the right ureteral orifice.  No other urothelial abnormalities.    Results:  I have reviewed prior patient's records  I have reviewed referring/prior physicians records  I have reviewed urinalysis Prior imaging studies--CT A/P in March 2025: 1. No acute findings within the abdomen or pelvis. No bowel abnormality. 2. 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 Recommend Urology consultation. 3. Aortic atherosclerosis.     "

## 2024-12-16 ENCOUNTER — Ambulatory Visit: Admitting: Urology

## 2024-12-16 DIAGNOSIS — N3289 Other specified disorders of bladder: Secondary | ICD-10-CM

## 2024-12-24 ENCOUNTER — Other Ambulatory Visit (HOSPITAL_COMMUNITY): Payer: Self-pay | Admitting: Internal Medicine

## 2024-12-24 DIAGNOSIS — N63 Unspecified lump in unspecified breast: Secondary | ICD-10-CM

## 2025-01-26 ENCOUNTER — Ambulatory Visit: Admitting: Gastroenterology

## 2025-06-17 ENCOUNTER — Ambulatory Visit: Admitting: Urology
